# Patient Record
Sex: Male | Born: 1960 | Hispanic: No | Marital: Married | Smoking: Current every day smoker
Health system: Southern US, Community
[De-identification: ages and names within clinical notes are randomized; demographics above are authoritative.]

## PROBLEM LIST (undated history)

## (undated) DIAGNOSIS — F4310 Post-traumatic stress disorder, unspecified: Secondary | ICD-10-CM

## (undated) DIAGNOSIS — I469 Cardiac arrest, cause unspecified: Secondary | ICD-10-CM

## (undated) DIAGNOSIS — D649 Anemia, unspecified: Secondary | ICD-10-CM

## (undated) DIAGNOSIS — K649 Unspecified hemorrhoids: Secondary | ICD-10-CM

## (undated) DIAGNOSIS — F1910 Other psychoactive substance abuse, uncomplicated: Secondary | ICD-10-CM

## (undated) DIAGNOSIS — Z720 Tobacco use: Secondary | ICD-10-CM

## (undated) DIAGNOSIS — F32A Depression, unspecified: Secondary | ICD-10-CM

## (undated) DIAGNOSIS — F329 Major depressive disorder, single episode, unspecified: Secondary | ICD-10-CM

## (undated) DIAGNOSIS — J984 Other disorders of lung: Secondary | ICD-10-CM

## (undated) DIAGNOSIS — B1920 Unspecified viral hepatitis C without hepatic coma: Secondary | ICD-10-CM

## (undated) HISTORY — DX: Post-traumatic stress disorder, unspecified: F43.10

## (undated) HISTORY — DX: Unspecified hemorrhoids: K64.9

## (undated) HISTORY — DX: Other psychoactive substance abuse, uncomplicated: F19.10

## (undated) HISTORY — DX: Anemia, unspecified: D64.9

## (undated) HISTORY — PX: FRACTURE SURGERY: SHX138

## (undated) HISTORY — DX: Major depressive disorder, single episode, unspecified: F32.9

## (undated) HISTORY — DX: Unspecified viral hepatitis C without hepatic coma: B19.20

## (undated) HISTORY — PX: WISDOM TOOTH EXTRACTION: SHX21

## (undated) HISTORY — DX: Other disorders of lung: J98.4

## (undated) HISTORY — DX: Tobacco use: Z72.0

## (undated) HISTORY — DX: Depression, unspecified: F32.A

---

## 1997-08-28 HISTORY — PX: MULTIPLE TOOTH EXTRACTIONS: SHX2053

## 2001-05-08 ENCOUNTER — Emergency Department (HOSPITAL_COMMUNITY): Admission: EM | Admit: 2001-05-08 | Discharge: 2001-05-08 | Payer: Self-pay | Admitting: Emergency Medicine

## 2002-06-21 ENCOUNTER — Emergency Department (HOSPITAL_COMMUNITY): Admission: EM | Admit: 2002-06-21 | Discharge: 2002-06-21 | Payer: Self-pay

## 2002-06-22 ENCOUNTER — Emergency Department (HOSPITAL_COMMUNITY): Admission: EM | Admit: 2002-06-22 | Discharge: 2002-06-22 | Payer: Self-pay | Admitting: Emergency Medicine

## 2003-04-25 ENCOUNTER — Emergency Department (HOSPITAL_COMMUNITY): Admission: EM | Admit: 2003-04-25 | Discharge: 2003-04-25 | Payer: Self-pay | Admitting: Emergency Medicine

## 2003-04-25 ENCOUNTER — Encounter: Payer: Self-pay | Admitting: Emergency Medicine

## 2003-04-26 ENCOUNTER — Emergency Department (HOSPITAL_COMMUNITY): Admission: EM | Admit: 2003-04-26 | Discharge: 2003-04-26 | Payer: Self-pay

## 2005-08-13 ENCOUNTER — Emergency Department (HOSPITAL_COMMUNITY): Admission: EM | Admit: 2005-08-13 | Discharge: 2005-08-13 | Payer: Self-pay | Admitting: Emergency Medicine

## 2005-08-20 ENCOUNTER — Emergency Department (HOSPITAL_COMMUNITY): Admission: EM | Admit: 2005-08-20 | Discharge: 2005-08-20 | Payer: Self-pay | Admitting: Emergency Medicine

## 2005-10-19 ENCOUNTER — Emergency Department (HOSPITAL_COMMUNITY): Admission: EM | Admit: 2005-10-19 | Discharge: 2005-10-19 | Payer: Self-pay | Admitting: Family Medicine

## 2006-03-11 ENCOUNTER — Emergency Department (HOSPITAL_COMMUNITY): Admission: EM | Admit: 2006-03-11 | Discharge: 2006-03-11 | Payer: Self-pay | Admitting: Emergency Medicine

## 2009-08-28 HISTORY — PX: CYST REMOVAL NECK: SHX6281

## 2009-12-16 ENCOUNTER — Ambulatory Visit: Payer: Self-pay | Admitting: Physician Assistant

## 2009-12-16 ENCOUNTER — Ambulatory Visit (HOSPITAL_COMMUNITY): Admission: RE | Admit: 2009-12-16 | Discharge: 2009-12-16 | Payer: Self-pay | Admitting: Physician Assistant

## 2009-12-16 DIAGNOSIS — F17200 Nicotine dependence, unspecified, uncomplicated: Secondary | ICD-10-CM

## 2009-12-16 DIAGNOSIS — F329 Major depressive disorder, single episode, unspecified: Secondary | ICD-10-CM

## 2009-12-16 DIAGNOSIS — J069 Acute upper respiratory infection, unspecified: Secondary | ICD-10-CM | POA: Insufficient documentation

## 2009-12-21 DIAGNOSIS — J984 Other disorders of lung: Secondary | ICD-10-CM | POA: Insufficient documentation

## 2009-12-21 HISTORY — DX: Other disorders of lung: J98.4

## 2010-02-11 ENCOUNTER — Ambulatory Visit: Payer: Self-pay | Admitting: Physician Assistant

## 2010-02-11 DIAGNOSIS — A630 Anogenital (venereal) warts: Secondary | ICD-10-CM

## 2010-02-11 LAB — CONVERTED CEMR LAB
Bilirubin Urine: NEGATIVE
Blood in Urine, dipstick: NEGATIVE
Glucose, Urine, Semiquant: NEGATIVE
Ketones, urine, test strip: NEGATIVE
Nitrite: NEGATIVE
Protein, U semiquant: NEGATIVE
Specific Gravity, Urine: 1.025
Urobilinogen, UA: 0.2
WBC Urine, dipstick: NEGATIVE
pH: 6.5

## 2010-02-14 ENCOUNTER — Encounter: Payer: Self-pay | Admitting: Physician Assistant

## 2010-02-14 LAB — CONVERTED CEMR LAB
ALT: 58 units/L — ABNORMAL HIGH (ref 0–53)
AST: 44 units/L — ABNORMAL HIGH (ref 0–37)
Albumin: 4 g/dL (ref 3.5–5.2)
Alkaline Phosphatase: 61 units/L (ref 39–117)
Amphetamine Screen, Ur: NEGATIVE
BUN: 10 mg/dL (ref 6–23)
Barbiturate Quant, Ur: NEGATIVE
Basophils Absolute: 0 10*3/uL (ref 0.0–0.1)
Basophils Relative: 0 % (ref 0–1)
Benzodiazepines.: NEGATIVE
CO2: 27 meq/L (ref 19–32)
Calcium: 8.9 mg/dL (ref 8.4–10.5)
Chloride: 106 meq/L (ref 96–112)
Cholesterol: 120 mg/dL (ref 0–200)
Cocaine Metabolites: NEGATIVE
Creatinine, Ser: 0.9 mg/dL (ref 0.40–1.50)
Creatinine,U: 120.1 mg/dL
Eosinophils Absolute: 0.1 10*3/uL (ref 0.0–0.7)
Eosinophils Relative: 2 % (ref 0–5)
Glucose, Bld: 89 mg/dL (ref 70–99)
HCT: 46.1 % (ref 39.0–52.0)
HDL: 41 mg/dL (ref >39)
Hemoglobin: 15 g/dL (ref 13.0–17.0)
Hep B S Ab: NEGATIVE
Hepatitis B Surface Ag: NEGATIVE
LDL Cholesterol: 70 mg/dL (ref 0–99)
Lymphocytes Relative: 29 % (ref 12–46)
Lymphs Abs: 2.6 10*3/uL (ref 0.7–4.0)
MCHC: 32.5 g/dL (ref 30.0–36.0)
MCV: 85.1 fL (ref 78.0–100.0)
Marijuana Metabolite: POSITIVE — AB
Methadone: NEGATIVE
Monocytes Absolute: 1 10*3/uL (ref 0.1–1.0)
Monocytes Relative: 11 % (ref 3–12)
Neutro Abs: 5.2 10*3/uL (ref 1.7–7.7)
Neutrophils Relative %: 59 % (ref 43–77)
Opiate Screen, Urine: NEGATIVE
Phencyclidine (PCP): NEGATIVE
Platelets: 162 10*3/uL (ref 150–400)
Potassium: 5.3 meq/L (ref 3.5–5.3)
Propoxyphene: NEGATIVE
RBC: 5.42 M/uL (ref 4.22–5.81)
RDW: 13.6 % (ref 11.5–15.5)
Sodium: 140 meq/L (ref 135–145)
TSH: 1.262 microintl units/mL (ref 0.350–4.500)
Total Bilirubin: 0.6 mg/dL (ref 0.3–1.2)
Total CHOL/HDL Ratio: 2.9
Total Protein: 6.6 g/dL (ref 6.0–8.3)
Triglycerides: 44 mg/dL (ref <150)
VLDL: 9 mg/dL (ref 0–40)
WBC: 9 10*3/uL (ref 4.0–10.5)

## 2010-02-16 ENCOUNTER — Telehealth: Payer: Self-pay | Admitting: Physician Assistant

## 2010-02-21 ENCOUNTER — Ambulatory Visit: Payer: Self-pay | Admitting: Physician Assistant

## 2010-02-22 DIAGNOSIS — B1710 Acute hepatitis C without hepatic coma: Secondary | ICD-10-CM | POA: Insufficient documentation

## 2010-02-22 LAB — CONVERTED CEMR LAB
ALT: 50 units/L (ref 0–53)
AST: 32 units/L (ref 0–37)
Albumin: 4.4 g/dL (ref 3.5–5.2)
Alkaline Phosphatase: 68 units/L (ref 39–117)
Bilirubin, Direct: 0.1 mg/dL (ref 0.0–0.3)
HCV Ab: REACTIVE — AB
Hep A Total Ab: NEGATIVE
Hep B Core Total Ab: NEGATIVE
Indirect Bilirubin: 0.2 mg/dL (ref 0.0–0.9)
Total Bilirubin: 0.3 mg/dL (ref 0.3–1.2)
Total Protein: 6.9 g/dL (ref 6.0–8.3)

## 2010-02-25 ENCOUNTER — Ambulatory Visit: Payer: Self-pay | Admitting: Physician Assistant

## 2010-02-25 LAB — CONVERTED CEMR LAB: HCV Quantitative: 122000 intl units/mL — ABNORMAL HIGH (ref <43)

## 2010-03-04 ENCOUNTER — Telehealth: Payer: Self-pay | Admitting: Physician Assistant

## 2010-03-04 ENCOUNTER — Ambulatory Visit: Payer: Self-pay | Admitting: Physician Assistant

## 2010-03-08 ENCOUNTER — Ambulatory Visit: Payer: Self-pay | Admitting: Physician Assistant

## 2010-03-22 ENCOUNTER — Ambulatory Visit: Payer: Self-pay | Admitting: Physician Assistant

## 2010-03-24 ENCOUNTER — Encounter: Payer: Self-pay | Admitting: Physician Assistant

## 2010-04-04 ENCOUNTER — Telehealth (INDEPENDENT_AMBULATORY_CARE_PROVIDER_SITE_OTHER): Payer: Self-pay | Admitting: *Deleted

## 2010-04-19 ENCOUNTER — Emergency Department (HOSPITAL_COMMUNITY): Admission: EM | Admit: 2010-04-19 | Discharge: 2010-04-19 | Payer: Self-pay | Admitting: Emergency Medicine

## 2010-04-22 ENCOUNTER — Ambulatory Visit: Payer: Self-pay | Admitting: Physician Assistant

## 2010-04-22 DIAGNOSIS — S335XXA Sprain of ligaments of lumbar spine, initial encounter: Secondary | ICD-10-CM

## 2010-04-26 ENCOUNTER — Emergency Department (HOSPITAL_COMMUNITY): Admission: EM | Admit: 2010-04-26 | Discharge: 2010-04-26 | Payer: Self-pay | Admitting: Emergency Medicine

## 2010-05-08 ENCOUNTER — Encounter: Payer: Self-pay | Admitting: Physician Assistant

## 2010-05-08 ENCOUNTER — Emergency Department (HOSPITAL_COMMUNITY): Admission: EM | Admit: 2010-05-08 | Discharge: 2010-05-08 | Payer: Self-pay | Admitting: Emergency Medicine

## 2010-05-09 ENCOUNTER — Telehealth: Payer: Self-pay | Admitting: Physician Assistant

## 2010-05-09 ENCOUNTER — Emergency Department (HOSPITAL_COMMUNITY): Admission: EM | Admit: 2010-05-09 | Discharge: 2010-05-09 | Payer: Self-pay | Admitting: Emergency Medicine

## 2010-05-17 ENCOUNTER — Encounter (INDEPENDENT_AMBULATORY_CARE_PROVIDER_SITE_OTHER): Payer: Self-pay | Admitting: Internal Medicine

## 2010-05-19 ENCOUNTER — Ambulatory Visit: Payer: Self-pay | Admitting: Internal Medicine

## 2010-05-19 ENCOUNTER — Ambulatory Visit (HOSPITAL_COMMUNITY): Admission: RE | Admit: 2010-05-19 | Discharge: 2010-05-19 | Payer: Self-pay | Admitting: Physician Assistant

## 2010-05-19 DIAGNOSIS — G8929 Other chronic pain: Secondary | ICD-10-CM | POA: Insufficient documentation

## 2010-05-19 DIAGNOSIS — M25559 Pain in unspecified hip: Secondary | ICD-10-CM

## 2010-05-20 ENCOUNTER — Telehealth: Payer: Self-pay | Admitting: Physician Assistant

## 2010-05-23 ENCOUNTER — Emergency Department (HOSPITAL_COMMUNITY): Admission: EM | Admit: 2010-05-23 | Discharge: 2010-05-23 | Payer: Self-pay | Admitting: Emergency Medicine

## 2010-05-26 ENCOUNTER — Inpatient Hospital Stay: Payer: Self-pay | Admitting: Psychiatry

## 2010-05-31 ENCOUNTER — Emergency Department: Payer: Self-pay | Admitting: Emergency Medicine

## 2010-06-06 ENCOUNTER — Ambulatory Visit: Payer: Self-pay | Admitting: Physician Assistant

## 2010-06-06 ENCOUNTER — Telehealth: Payer: Self-pay | Admitting: Physician Assistant

## 2010-06-06 DIAGNOSIS — F4310 Post-traumatic stress disorder, unspecified: Secondary | ICD-10-CM

## 2010-06-14 ENCOUNTER — Ambulatory Visit: Payer: Self-pay | Admitting: Internal Medicine

## 2010-06-14 ENCOUNTER — Encounter: Payer: Self-pay | Admitting: Physician Assistant

## 2010-06-15 ENCOUNTER — Telehealth: Payer: Self-pay | Admitting: Physician Assistant

## 2010-06-17 ENCOUNTER — Encounter
Admission: RE | Admit: 2010-06-17 | Discharge: 2010-08-16 | Payer: Self-pay | Source: Home / Self Care | Attending: Physician Assistant | Admitting: Physician Assistant

## 2010-06-17 ENCOUNTER — Telehealth (INDEPENDENT_AMBULATORY_CARE_PROVIDER_SITE_OTHER): Payer: Self-pay | Admitting: Internal Medicine

## 2010-06-27 ENCOUNTER — Encounter: Payer: Self-pay | Admitting: Physician Assistant

## 2010-07-07 ENCOUNTER — Ambulatory Visit: Payer: Self-pay | Admitting: Gastroenterology

## 2010-07-07 ENCOUNTER — Encounter: Payer: Self-pay | Admitting: Physician Assistant

## 2010-07-11 ENCOUNTER — Encounter (INDEPENDENT_AMBULATORY_CARE_PROVIDER_SITE_OTHER): Payer: Self-pay | Admitting: Internal Medicine

## 2010-07-11 ENCOUNTER — Encounter: Payer: Self-pay | Admitting: Physician Assistant

## 2010-07-23 ENCOUNTER — Emergency Department (HOSPITAL_COMMUNITY)
Admission: EM | Admit: 2010-07-23 | Discharge: 2010-07-23 | Payer: Self-pay | Source: Home / Self Care | Admitting: Orthopedic Surgery

## 2010-08-16 ENCOUNTER — Telehealth (INDEPENDENT_AMBULATORY_CARE_PROVIDER_SITE_OTHER): Payer: Self-pay | Admitting: Internal Medicine

## 2010-08-18 ENCOUNTER — Ambulatory Visit: Payer: Self-pay | Admitting: Internal Medicine

## 2010-09-09 ENCOUNTER — Encounter (INDEPENDENT_AMBULATORY_CARE_PROVIDER_SITE_OTHER): Payer: Self-pay | Admitting: Nurse Practitioner

## 2010-09-26 ENCOUNTER — Emergency Department (HOSPITAL_COMMUNITY)
Admission: EM | Admit: 2010-09-26 | Discharge: 2010-09-26 | Payer: No Typology Code available for payment source | Source: Home / Self Care | Admitting: Emergency Medicine

## 2010-09-27 NOTE — Assessment & Plan Note (Signed)
Summary: Condyloma Treatment   Vital Signs:  Patient profile:   50 year old male Height:      70 inches Weight:      148 pounds BMI:     21.31 Temp:     97.4 degrees F oral Pulse rate:   59 / minute Pulse rhythm:   regular Resp:     18 per minute BP sitting:   103 / 68  (left arm) Cuff size:   large  Vitals Entered By: [name] (March 22, 2010 11:01 AM) CC: f/u.... Is Patient Diabetic? No Pain Assessment Patient in pain? no       Does patient need assistance? Functional Status Self care Ambulation Normal   Primary Care Provider:  Tereso Newcomer, PA-C  CC:  f/u.....  History of Present Illness: Here for tx of genital warts.  Current Medications (verified): 1)  Proventil Hfa 108 (90 Base) Mcg/act Aers (Albuterol Sulfate) .Marland Kitchen.. 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 1-2 puffs every 4-6 hours as needed  Patient Instructions: 1)  Please schedule with Derm Clinic on [facility]. 2)  Call me if you do not hear about an appointment with the Hepatitis C clinic in the next 3-4 weeks.

## 2010-09-27 NOTE — Assessment & Plan Note (Signed)
Summary: new to estab///kt   Vital Signs:  Patient profile:   50 year old male Height:      70 inches Weight:      150.38 pounds BMI:     21.66 Temp:     97.5 degrees F Pulse rate:   56 / minute Pulse rhythm:   regular Resp:     18 per minute BP sitting:   127 / 82  (right arm) Cuff size:   regular  Vitals Entered By: Chauncy Passy, SMA CC: Pt. is here to establish himself w/[facility]. Pt. has a cold associated w/a runny nose, body ache, congestion. This began on Friday and is feeling better.  Is Patient Diabetic? No Pain Assessment Patient in pain? no       Does patient need assistance? Functional Status Self care Ambulation Normal   Primary Care Provider:  Tereso Newcomer, PA-C  CC:  Pt. is here to establish himself w/[facility]. Pt. has a cold associated w/a runny nose, body ache, and congestion. This began on Friday and is feeling better. Marland Kitchen  History of Present Illness: New Patient. I see his wife Airline pilot.  Previously followed at [facility].  Says he had a CPE in 2009 and cholesterol, etc was ok.  Here c/o URI symptoms for about a week.  Notes myalgias, low grade temp.  No chills.  + cough . . . productive of white/clear sputum.  No hemoptysis or purulent sputum.  + rhinorrhea. Slight HA.  No sore throat.  No otalgia.  Feels like he is now getting better.  Taking benadryl and DayQuil.  Allergy symptoms are increased with exposure to pollen.  Has been out mowing yards.  Also, taking ibuprofen as needed.   Habits & Providers  Alcohol-Tobacco-Diet     Alcohol drinks/day: <1     Alcohol type: beer     Tobacco Status: current     Cigarette Packs/Day: 1.0     Pack years: 27  Exercise-Depression-Behavior     Drug Use: past  Current Medications (verified): 1)  None  Allergies (verified): No Known Drug Allergies  Past History:  Past Medical History: COPD Depression (?)   a.  saw a psychiatrist in past   b.  never been on  meds.  Family History: CAD - Dad with CABG 57 yo (died 80 with MI) Colon Cancer - Grandfather; no 1st degree relatives Family History Diabetes 1st degree relative - Dad Family History Hypertension - Dad and Mom Family History Depression - Mom  No prostate cancer  Social History: unemployed Statistician and Music therapist) Married has children from previous marriage (4 + 4 grandkids) Current Smoker Alcohol use-yes Drug use-no   a.  prior h/o THC, cocaine . . . none nowSmoking Status:  current Drug Use:  past Packs/Day:  1.0  Review of Systems  The patient denies chest pain, syncope, dyspnea on exertion, prolonged cough, hemoptysis, melena, hematochezia, and severe indigestion/heartburn.    Physical Exam  General:  alert, well-developed, and well-nourished.   Head:  normocephalic and atraumatic.   Eyes:  pupils equal, pupils round, and pupils reactive to light.   Ears:  R ear normal and L ear normal.   Nose:  no external deformity.   Mouth:  pharynx pink and moist and no exudates.   Neck:  supple and no cervical lymphadenopathy.   Lungs:  decreased breath sounds ins/exp wheezing bilat no rales  Heart:  normal rate and regular rhythm.   Abdomen:  soft and  non-tender.   Neurologic:  alert & oriented X3 and cranial nerves II-XII intact.   Psych:  normally interactive.     Impression & Recommendations:  Problem # 1:  URI (ICD-465.9)  likely viral conservative mgmt wheezing likely 2/2 URI in setting of smoking will rx proventil  Orders: CXR- 2view (CXR)  Problem # 2:  TOBACCO ABUSE (ICD-305.1) advised to quit consider PFTs at f/u if still has significant wheezing  Problem # 3:  Preventive Health Care (ICD-V70.0) schedule CPE  Problem # 4:  DEPRESSION (ICD-311) mainly situational brother murdered and he often gets down about it PHQ9=2 offered counseling if he wants. . . he declines at this time  Complete Medication List: 1)  Proventil Hfa 108 (90 Base) Mcg/act  Aers (Albuterol sulfate) .Marland Kitchen.. 1-2 puffs every 4-6 hours as needed  Patient Instructions: 1)  Tobacco is very bad for your health and your loved ones ! You should stop smoking !  2)  Stop smoking tips: Choose a quit date. Cut down before the quit date. Decide what you will do as a substitute when you feel the urge to smoke(gum, toothpick, exercise).  3)  Please schedule a follow-up appointment in 6 weeks with Lauralie Blacksher for CPE.  Come fasting (nothing to eat or drink after midnight the night before except water). 4)  Take 650 - 1000 mg of tylenol every 4-6 hours as needed for relief of pain or comfort of fever. Avoid taking more than 4000 mg in a 24 hour period( can cause liver damage in higher doses).  5)  Drink plenty of fluids. 6)  Use Proventil as needed for cough or wheezing. 7)  Robitussin DM is fine to use ( your can get over the counter ). Prescriptions: PROVENTIL HFA 108 (90 BASE) MCG/ACT AERS (ALBUTEROL SULFATE) 1-2 puffs every 4-6 hours as needed  #1 x 5   Entered and Authorized by:   Tereso Newcomer PA-C   Signed by:   Tereso Newcomer PA-C on 12/16/2009   Method used:   Print then Give to Patient   RxID:   [id number]

## 2010-09-27 NOTE — Assessment & Plan Note (Signed)
Summary: Lumbar Strain   Vital Signs:  Patient profile:   50 year old male Height:      70 inches Weight:      151 pounds BMI:     21.74 Temp:     97.7 degrees F oral Pulse rate:   65 / minute Pulse rhythm:   regular Resp:     18 per minute BP sitting:   118 / 78  (left arm) Cuff size:   large  Vitals Entered By: [name] (April 22, 2010 9:50 AM) CC: pt is here for back pain that is running down to his legs.... Is Patient Diabetic? No Pain Assessment Patient in pain? no       Does patient need assistance? Functional Status Self care Ambulation Normal   Primary Care Provider:  Tereso Newcomer, PA-C  CC:  pt is here for back pain that is running down to his legs.....  History of Present Illness: Developed low back pain about a week ago.  He is very active.  Had pulled up carpet and sanded hardwood floors.  Then, does a lot of landscaping.  Noted left lumbar pain that worsened.  He went to the ED on 8/23.  He was given valium and ibuprofen.  He is better but still has pain.  Has continued to try to do landscaping, etc.  Notes worse pain at night.  No radicular symptoms.  Has some occ sharp shooting pain lasting just seconds in post left leg.  No weakness.  No loss of bowel or bladder function.    Current Medications (verified): 1)  Proventil Hfa 108 (90 Base) Mcg/act Aers (Albuterol Sulfate) .Marland Kitchen.. 1-2 Puffs Every 4-6 Hours As Needed  Allergies (verified): No Known Drug Allergies  Past History:  Past Medical History: Last updated: 02/22/2010 COPD Depression (?)   a.  saw a psychiatrist in past   b.  never been on meds. +HCV antibody test  Physical Exam  General:  alert, well-developed, and well-nourished.   Head:  normocephalic and atraumatic.   Msk:  no spinal tend to palp  neg SLR bilat  Extremities:  no edema Neurologic:  alert & oriented X3 and cranial nerves II-XII intact.   Patellar DTRs 2+ bilat Achilles on right 2+; patient unable to relax on left  for DTR check strength BLE normal and equal in all muscle groups Skin:  turgor normal.   Psych:  normally interactive.     Impression & Recommendations:  Problem # 1:  LUMBAR STRAIN (ICD-847.2) reassurance encouraged rest for a few days no heavy lifting or bending or stooping Valium helping a lot . . . would like refilled taking ibuprofen cont with heat as needed  f/u as needed  Complete Medication List: 1)  Proventil Hfa 108 (90 Base) Mcg/act Aers (Albuterol sulfate) .Marland Kitchen.. 1-2 puffs every 4-6 hours as needed 2)  Valium 5 Mg Tabs (Diazepam) .Marland Kitchen.. 1 by mouth every 6 hours as needed for muscle pain or spasm  Patient Instructions: 1)  Most patients (90%) with low back pain will improve with time ( 2-6 weeks). Keep active but avoid activities that are painful. Apply moist heat and/or ice to lower back several times a day.  2)  NO heavy lifting, bending, stooping, pushing, pulling, etc for about a week to help your back heal. 3)  Continue Ibuprofen (motrin) three times a day with food for 3-4 more days, then as needed. 4)  You can use Tylenol (Acetaminophen) 500 mg 1-2 tabs every 6  hours as needed. 5)  Use valium only as needed for pain or spasm.  Do not take more that what is directed. 6)  Follow up if symptoms are no better in 2 weeks or get worse. Prescriptions: VALIUM 5 MG TABS (DIAZEPAM) 1 by mouth every 6 hours as needed for muscle pain or spasm  #30 x 0   Entered and Authorized by:   Tereso Newcomer PA-C   Signed by:   Tereso Newcomer PA-C on 04/22/2010   Method used:   Print then Give to Patient   RxID:   [id number]

## 2010-09-27 NOTE — Progress Notes (Signed)
  Phone Note Refill Request Message from:  Patient on June 17, 2010 10:39 AM  Refills Requested: Medication #1:  FLEXERIL 10 MG TABS Take 1 tablet by mouth once a day as needed [facility]   Method Requested: Electronic Initial call taken by: [name],  June 17, 2010 10:39 AM  Follow-up for Phone Call        Why is he requesting a refill of Flexeril?   Appears to have been a one time Rx. Did he start with PT yet? Follow-up by: Julieanne Manson MD,  June 21, 2010 11:32 PM  Additional Follow-up for Phone Call Additional follow up Details #1::        yes pt has started PT and he wants the med because he the pain he is in after PT Additional Follow-up by: [name],  June 22, 2010 12:37 PM    Additional Follow-up for Phone Call Additional follow up Details #2::    Please call into pharmacy for him--telephone order written.  Julieanne Manson MD  June 24, 2010 8:38 AM    pt is aware.... [name]  June 24, 2010 10:25 AM   Prescriptions: FLEXERIL 10 MG TABS (CYCLOBENZAPRINE HCL) Take 1 tablet by mouth once a day as needed  #30 x 0   Entered and Authorized by:   Julieanne Manson MD   Signed by:   Julieanne Manson MD on 06/24/2010   Method used:   Telephoned to ...       [facility] (retail)       [address] [place], [place]  [address]       Ph: [phone]       Fax: [phone]   RxID:   [id number]

## 2010-09-27 NOTE — Progress Notes (Signed)
Summary: Medical Specialty Clinic referral  Phone Note Other Incoming   Summary of Call: Please refer to Hep C clinic for Hepatitis C. Initial call taken by: Tereso Newcomer PA-C,  March 04, 2010 2:17 PM

## 2010-09-27 NOTE — Miscellaneous (Signed)
Summary: Rehab Report//DISCHARGE SUMMARY  Rehab Report//DISCHARGE SUMMARY   Imported By: Arta Bruce 07/29/2010 16:57:49  _____________________________________________________________________  External Attachment:    Type:   Image     Comment:   External Document

## 2010-09-27 NOTE — Progress Notes (Signed)
Summary: [facility]  Phone Note From Other Clinic   Summary of Call: Tonya from [place] lab called from the add-on by Aggie Cosier on 6/20 the Hep B Core and Hep A IGM was not able to be done from that specimen. Everything else was done but those two test. Initial call taken by: Levon Hedger,  February 16, 2010 9:13 AM  Follow-up for Phone Call        pt is aware and will give [place] a call to come in for an appt Follow-up by: [name],  February 16, 2010 11:23 AM

## 2010-09-27 NOTE — Assessment & Plan Note (Signed)
Summary: F/U PER PA Kennedy Bohanon / NS   Vital Signs:  Patient profile:   50 year old male Height:      70 inches Weight:      151 pounds BMI:     21.74 Temp:     97.7 degrees F oral Pulse rate:   76 / minute Pulse rhythm:   regular Resp:     18 per minute BP sitting:   110 / 70  (left arm) Cuff size:   regular  Vitals Entered By: [name] (February 25, 2010 12:14 PM) CC: f/u.. Is Patient Diabetic? No Pain Assessment Patient in pain? no       Does patient need assistance? Functional Status Self care Ambulation Normal   Primary Care Provider:  Tereso Newcomer, PA-C  CC:  f/u...  History of Present Illness: Here for repeat wart treatment.   Current Medications (verified): 1)  Proventil Hfa 108 (90 Base) Mcg/act Aers (Albuterol Sulfate) .Marland Kitchen.. 1-2 Puffs Every 4-6 Hours As Needed  Allergies (verified): No Known Drug Allergies  Physical Exam  General:  alert, well-developed, and well-nourished.   Head:  normocephalic and atraumatic.   Skin:  several condyloma on penis Psych:  normally interactive.     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 1-2 puffs every 4-6 hours as needed  Patient Instructions: 1)  Schedule follow up with Issak Goley in one week.

## 2010-09-27 NOTE — Letter (Signed)
Summary: DEPRESSION SCREENING  DEPRESSION SCREENING   Imported By: Arta Bruce 02/15/2010 15:34:01  _____________________________________________________________________  External Attachment:    Type:   Image     Comment:   External Document

## 2010-09-27 NOTE — Progress Notes (Signed)
Summary: Hepatitis C  Phone Note Outgoing Call   Summary of Call: Patient would like to go to [facility] to see Dr. Jacqualine Mau.  He is very nervous about his diagnosis.   Is it possible for him to go see him in [place] once and then follow up here afterward? If so, please arrange. Initial call taken by: Brynda Rim,  June 06, 2010 3:22 PM  Follow-up for Phone Call        PT HAVE AN APPT 07-07-10 @ 1:30PM  Follow-up by: Cheryll Dessert,  June 10, 2010 5:58 PM
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
## 2010-09-27 NOTE — Letter (Signed)
Summary: REFERRAL/DERMATOLOGY//APPT DATE & TIME  REFERRAL/DERMATOLOGY//APPT DATE & TIME   Imported By: Arta Bruce 03/23/2010 10:55:59  _____________________________________________________________________  External Attachment:    Type:   Image     Comment:   External Document

## 2010-09-27 NOTE — Assessment & Plan Note (Signed)
Summary: one week f/u per Lorin Picket /tmm   Vital Signs:  Patient profile:   50 year old male Height:      70 inches Weight:      148 pounds BMI:     21.31 Temp:     97.6 degrees F oral Pulse rate:   61 / minute Pulse rhythm:   regular Resp:     18 per minute BP sitting:   113 / 75  (left arm) Cuff size:   regular  Vitals Entered By: [name] (March 04, 2010 9:42 AM) Is Patient Diabetic? No Pain Assessment Patient in pain? no       Does patient need assistance? Functional Status Self care Ambulation Normal   Primary Care Provider:  Tereso Newcomer, PA-C   History of Present Illness: Here for tx of condyloma of penis. HCV labs came back with + viral load.   Current Medications (verified): 1)  Proventil Hfa 108 (90 Base) Mcg/act Aers (Albuterol Sulfate) .Marland Kitchen.. 1-2 Puffs Every 4-6 Hours As Needed  Allergies (verified): No Known Drug Allergies  Physical Exam  General:  alert, well-developed, and well-nourished.   Head:  normocephalic and atraumatic.   Genitalia:  several condyloma noted on penis treated with podophyllin   Impression & Recommendations:  Problem # 1:  HEPATITIS C (ICD-070.51)  viral load + discussed with patient and wife  refer to Hep C clinic  Orders: Misc. Referral (Misc. 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 1-2 puffs every 4-6 hours as needed  Patient Instructions: 1)  Schedule follow up with Bethzaida Boord in one week. 2)  Someone will call you to set up your referral.

## 2010-09-27 NOTE — Letter (Signed)
Summary: CALL A NURSE  CALL A NURSE   Imported By: Arta Bruce 05/12/2010 14:33:00  _____________________________________________________________________  External Attachment:    Type:   Image     Comment:   External Document

## 2010-09-27 NOTE — Progress Notes (Signed)
Summary: Have not heard from specialist  Phone Note Call from Patient   Summary of Call: Mrs. Hark states that they still have not heard from the specialist and wanted to let you know.   Initial call taken by: Dutch Quint RN,  April 04, 2010 4:26 PM  Follow-up for Phone Call        Hep C clinic or dermatology? Tereso Newcomer PA-C  April 04, 2010 5:56 PM   Hep C Clinic.   Dutch Quint RN  April 05, 2010 11:28 AM   Additional Follow-up for Phone Call Additional follow up Details #1::        Arna Medici Have we heard anything about his appt with the medical specialty clinic? Additional Follow-up by: Tereso Newcomer PA-C,  April 05, 2010 4:35 PM    Additional Follow-up for Phone Call Additional follow up Details #2::    Higinio Plan faxed on July I talk to then and they said that they only have 1 Dr and they trying to get the appts  they said that he can call @ [phone] .I spoke to pt today . Follow-up by: Cheryll Dessert,  April 06, 2010 5:32 PM

## 2010-09-27 NOTE — Progress Notes (Signed)
  Phone Note Refill Request Message from:  Patient on June 15, 2010 11:50 AM  Refills Requested: Medication #1:  NABUMETONE 500 MG TABS Take 1 tablet by mouth two times a day with food as needed for pain.   Last Refilled: 05/09/2010 [facility] [address]   Method Requested: Fax to Local Pharmacy Initial call taken by: [name],  June 15, 2010 11:49 AM  Follow-up for Phone Call        [facility] [address]. Rx in basket to fax. Tereso Newcomer PA-C  June 15, 2010 12:11 PM   Additional Follow-up for Phone Call Additional follow up Details #1::        faxed to pharmacy Additional Follow-up by: [name],  June 15, 2010 2:37 PM    Prescriptions: NABUMETONE 500 MG TABS (NABUMETONE) Take 1 tablet by mouth two times a day with food as needed for pain  #60 x 1   Entered and Authorized by:   Tereso Newcomer PA-C   Signed by:   Tereso Newcomer PA-C on 06/15/2010   Method used:   Printed then faxed to ...       [facility] [address] [id number] (retail)       [address] [place], [place]  [address]       Ph: [phone]       Fax: [phone]   RxID:   [id number]

## 2010-09-27 NOTE — Assessment & Plan Note (Signed)
Summary: Knee and Hip Pain   Vital Signs:  Patient profile:   50 year old male Height:      70 inches Weight:      145.7 pounds BMI:     20.98 Temp:     97.1 degrees F oral Pulse rate:   76 / minute Pulse rhythm:   regular Resp:     18 per minute BP sitting:   110 / 70  (left arm)  Vitals Entered By: [name] (May 19, 2010 10:12 AM) CC: F/U hospital Is Patient Diabetic? No Pain Assessment Patient in pain? no       Does patient need assistance? Functional Status Self care Ambulation Normal   Primary Care Provider:  Tereso Newcomer, PA-C  CC:  F/U hospital.  History of Present Illness: Here for f/u on knee and hip.  Went to ED.  Had to go back.  Was given prednisone.  Feels better now.  Of note, he had MRI of his back.  Results:  Normal Lumbar spine.  Pain in back started about a month ago.  Was doing some heavy work.  I saw him and thought he had a strain.  Pain then started to involve his left hip and left knee.  Had knee fx in high school.  Was casted for several mos.  NO swelling or redness.  No locking or instability.  Hip hurts with bending forward.  No loss of ROM.   Current Medications (verified): 1)  Proventil Hfa 108 (90 Base) Mcg/act Aers (Albuterol Sulfate) .Marland Kitchen.. 1-2 Puffs Every 4-6 Hours As Needed 2)  Valium 5 Mg Tabs (Diazepam) .Marland Kitchen.. 1 By Mouth Every 6 Hours As Needed For Muscle Pain or Spasm 3)  Flexeril 10 Mg Tabs (Cyclobenzaprine Hcl) .... Take One Tablet Two Times A Day As Needed For Back Pain or Spasm.  Only Take If Needed. 4)  Nabumetone 500 Mg Tabs (Nabumetone) .... Take 1 Tablet By Mouth Two Times A Day With Food As Needed For Pain  Allergies (verified): No Known Drug Allergies  Physical Exam  General:  alert, well-developed, and well-nourished.   Head:  normocephalic and atraumatic.   Msk:  Left hip:  + pain with internal rotation; + pain over ant joint  Left knee: no eff neg mcmurray neg ant drawer. + crepitus with  PROM  Neurologic:  alert & oriented X3 and cranial nerves II-XII intact.   Skin:  turgor normal.   Psych:  normally interactive.     Impression & Recommendations:  Problem # 1:  HIP PAIN, LEFT (ICD-719.45) check xrays prob refer to PT  His updated medication list for this problem includes:    Flexeril 10 Mg Tabs (Cyclobenzaprine hcl) .Marland Kitchen... Take 1 tablet by mouth once a day as needed    Nabumetone 500 Mg Tabs (Nabumetone) .Marland Kitchen... Take 1 tablet by mouth two times a day with food as needed for pain  Orders: Diagnostic X-Ray/Fluoroscopy (Diagnostic X-Ray/Flu)  Problem # 2:  KNEE PAIN, LEFT (ICD-719.46) suspect he aggravated old injury with back strain check xrays prob refer to PT  His updated medication list for this problem includes:    Flexeril 10 Mg Tabs (Cyclobenzaprine hcl) .Marland Kitchen... Take 1 tablet by mouth once a day as needed    Nabumetone 500 Mg Tabs (Nabumetone) .Marland Kitchen... Take 1 tablet by mouth two times a day with food as needed for pain  Orders: Diagnostic X-Ray/Fluoroscopy (Diagnostic X-Ray/Flu)  Problem # 3:  LUMBAR STRAIN (ICD-847.2) MRI neg prob refer to  PT out of flexeril reminded him to take only as needed  Complete Medication List: 1)  Proventil Hfa 108 (90 Base) Mcg/act Aers (Albuterol sulfate) .Marland Kitchen.. 1-2 puffs every 4-6 hours as needed 2)  Valium 5 Mg Tabs (Diazepam) .Marland Kitchen.. 1 by mouth every 6 hours as needed for muscle pain or spasm 3)  Flexeril 10 Mg Tabs (Cyclobenzaprine hcl) .... Take 1 tablet by mouth once a day as needed 4)  Nabumetone 500 Mg Tabs (Nabumetone) .... Take 1 tablet by mouth two times a day with food as needed for pain  Patient Instructions: 1)  Get your xrays. 2)  Take medicines as needed. 3)  I will likely send you to physical therapy.  I want your xrays first. Prescriptions: FLEXERIL 10 MG TABS (CYCLOBENZAPRINE HCL) Take 1 tablet by mouth once a day as needed  #30 x 0   Entered and Authorized by:   Tereso Newcomer PA-C   Signed by:   Tereso Newcomer  PA-C on 05/19/2010   Method used:   Print then Give to Patient   RxID:   [id number]    MRI EXAM  Procedure date:  04/26/2010  Findings:      Exam Type: Lumbar Spine Results:  Findings: The numbering convention used for this exam terms L5-S1   as the last full intervertebral disc space above the sacrum.   Alignment anatomic.  Spinal cord terminates posterior to the L1   vertebral body.  Marrow signal normal.  Paraspinal soft tissues   demonstrate a T2 hyperintense 1 cm lesion in the posterior right   hepatic lobe and sub centimeter cystic lesion in the left   interpolar kidney, both likely representing cysts.  Disc signal is   normal.  No protrusions or significant bulging is present.  No   central, lateral recess, or foraminal stenosis is identified along   the length of the lumbar spine.    IMPRESSION:   Negative MRI lumbar spine.

## 2010-09-27 NOTE — Progress Notes (Signed)
Summary: Back Pain  Phone Note Call from Patient Call back at [phone]   Caller: Spouse Reason for Call: Talk to Doctor Summary of Call: PT WENT TO THE HOSPITAL THIS WEEKEND AND THEY TOLDHIM TO COME BACK AND SEE THE PROVIDER TODAY ON MONDAY BUT THERE ISNT ANYTHING OPEN, HE WENT TO THE HOSPITAL FOR SEVERE PAIN IN HIS BACK AND LEG, PT REQUESTING SOMETHING FOR THE PAIN TOO ... Initial call taken by: Oscar La,  May 09, 2010 8:09 AM  Follow-up for Phone Call        will pull er notes and put them on your desk... Follow-up by: [name],  May 09, 2010 9:10 AM  Additional Follow-up for Phone Call Additional follow up Details #1::        MRI of lumbar spine done recently and this was ok. I just filled Valium for him on 8/26.   I will fill Flexeril for him.  Rx in basket. He can take Relafen 500 mg by mouth two times a day with food as needed for pain.  He can take Tylenol 500 mg 1-2 tabs every 6 hours as needed for pain. Schedule next available appt . . .with me or other provider if something available. Will need to discuss possible referral to physical therapy at his appt.   Additional Follow-up by: Tereso Newcomer PA-C,  May 09, 2010 9:54 AM    Additional Follow-up for Phone Call Additional follow up Details #2::    pt is aware and [facility] pharmacy Follow-up by: [name],  May 09, 2010 4:36 PM  New/Updated Medications: FLEXERIL 10 MG TABS (CYCLOBENZAPRINE HCL) Take one tablet two times a day as needed for back pain or spasm.  Only take if needed. NABUMETONE 500 MG TABS (NABUMETONE) Take 1 tablet by mouth two times a day with food as needed for pain Prescriptions: NABUMETONE 500 MG TABS (NABUMETONE) Take 1 tablet by mouth two times a day with food as needed for pain  #60 x 0   Entered and Authorized by:   Tereso Newcomer PA-C   Signed by:   Tereso Newcomer PA-C on 05/09/2010   Method used:   Printed then faxed to ...       [facility] (retail)       [address] [place], [place]  [address]       Ph: [phone]       Fax: [phone]   RxID:   [id number] FLEXERIL 10 MG TABS (CYCLOBENZAPRINE HCL) Take one tablet two times a day as needed for back pain or spasm.  Only take if needed.  #30 x 0   Entered and Authorized by:   Tereso Newcomer PA-C   Signed by:   Tereso Newcomer PA-C on 05/09/2010   Method used:   Printed then faxed to ...       [facility] (retail)       [address] [place], [place]  [address]       Ph: [phone]       Fax: [phone]   RxID:   [id number]

## 2010-09-27 NOTE — Miscellaneous (Signed)
Summary: Rehab Report//INITIAL SUMMARY  Rehab Report//INITIAL SUMMARY   Imported By: Arta Bruce 06/27/2010 10:49:50  _____________________________________________________________________  External Attachment:    Type:   Image     Comment:   External Document

## 2010-09-27 NOTE — Letter (Signed)
Summary: [facility]   Imported By: Arta Bruce 06/15/2010 12:17:38  _____________________________________________________________________  External Attachment:    Type:   Image     Comment:   External Document

## 2010-09-27 NOTE — Progress Notes (Signed)
Summary: PT referral  Phone Note Outgoing Call   Summary of Call: hip and knee xrays ok just a little arthritis in his knee I would like to send him to PT notify pt before making referral order in basket  Initial call taken by: Brynda Rim,  May 20, 2010 3:00 PM  Follow-up for Phone Call        pt is aware Follow-up by: [name],  May 23, 2010 9:23 AM       Impression & Recommendations:  Problem # 1:  KNEE PAIN, LEFT (ICD-719.46)  His updated medication list for this problem includes:    Flexeril 10 Mg Tabs (Cyclobenzaprine hcl) .Marland Kitchen... Take 1 tablet by mouth once a day as needed    Nabumetone 500 Mg Tabs (Nabumetone) .Marland Kitchen... Take 1 tablet by mouth two times a day with food as needed for pain  Orders: Physical Therapy Referral (PT)  Problem # 2:  HIP PAIN, LEFT (ICD-719.45)  His updated medication list for this problem includes:    Flexeril 10 Mg Tabs (Cyclobenzaprine hcl) .Marland Kitchen... Take 1 tablet by mouth once a day as needed    Nabumetone 500 Mg Tabs (Nabumetone) .Marland Kitchen... Take 1 tablet by mouth two times a day with food as needed for pain  Orders: Physical Therapy Referral (PT)  Complete Medication List: 1)  Proventil Hfa 108 (90 Base) Mcg/act Aers (Albuterol sulfate) .Marland Kitchen.. 1-2 puffs every 4-6 hours as needed 2)  Valium 5 Mg Tabs (Diazepam) .Marland Kitchen.. 1 by mouth every 6 hours as needed for muscle pain or spasm 3)  Flexeril 10 Mg Tabs (Cyclobenzaprine hcl) .... Take 1 tablet by mouth once a day as needed 4)  Nabumetone 500 Mg Tabs (Nabumetone) .... Take 1 tablet by mouth two times a day with food as needed for pain

## 2010-09-27 NOTE — Letter (Signed)
Summary: MEDICAL [facility] SERVICES/TO SET UP APPT  [facility]ICAL [facility]TY SERVICES/TO SET UP APPT   Imported By: Arta Bruce 03/21/2010 12:53:37  _____________________________________________________________________  External Attachment:    Type:   Image     Comment:   External Document

## 2010-09-27 NOTE — Letter (Signed)
Summary: [place]/PHYSICIAN DOCUMENT SHEET  [facility] [facility]/PHYSICIAN DOCUMENT SHEET   Imported By: Arta Bruce 05/17/2010 15:14:48  _____________________________________________________________________  External Attachment:    Type:   Image     Comment:   External Document

## 2010-09-27 NOTE — Miscellaneous (Signed)
Summary: Rehab Report//DISCHARGE SUMMARY  Rehab Report//DISCHARGE SUMMARY   Imported By: Arta Bruce 07/29/2010 16:51:09  _____________________________________________________________________  External Attachment:    Type:   Image     Comment:   External Document

## 2010-09-27 NOTE — Progress Notes (Signed)
Summary: Office Visit//DEPRESSION SCREENING  Office Visit//DEPRESSION SCREENING   Imported By: Arta Bruce 06/14/2010 15:41:45  _____________________________________________________________________  External Attachment:    Type:   Image     Comment:   External Document

## 2010-09-27 NOTE — Assessment & Plan Note (Signed)
Summary: F/U PER PROVIDER  / NS   Vital Signs:  Patient profile:   50 year old male Height:      70 inches Weight:      151 pounds BMI:     21.74 Temp:     97.9 degrees F oral Pulse rate:   54 / minute Pulse rhythm:   regular Resp:     18 per minute BP sitting:   112 / 68  (left arm) Cuff size:   regular  Vitals Entered By: [name] (February 21, 2010 3:09 PM) CC: f/u.... pt would lke to know test results... Is Patient Diabetic? No Pain Assessment Patient in pain? no       Does patient need assistance? Functional Status Self care Ambulation Normal   Primary Care Provider:  Tereso Newcomer, PA-C  CC:  f/u.... pt would lke to know test results....  History of Present Illness: Here for treatment of genital warts.   Current Medications (verified): 1)  Proventil Hfa 108 (90 Base) Mcg/act Aers (Albuterol Sulfate) .Marland Kitchen.. 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 1-2 puffs every 4-6 hours as needed  Patient Instructions: 1)  Follow up as planned at the end of this week.

## 2010-09-27 NOTE — Assessment & Plan Note (Signed)
Summary: Condyloma   Vital Signs:  Patient profile:   50 year old male Height:      70 inches Weight:      152 pounds BMI:     21.89 Temp:     97.8 degrees F oral Pulse rate:   54 / minute Pulse rhythm:   regular Resp:     18 per minute BP sitting:   108 / 69  (left arm) Cuff size:   large  Vitals Entered By: [name] (March 08, 2010 8:45 AM) Is Patient Diabetic? No Pain Assessment Patient in pain? no       Does patient need assistance? Functional Status Self care Ambulation Normal   Primary Care Provider:  Tereso Newcomer, PA-C   History of Present Illness: Here for condyloma treatment   Current Medications (verified): 1)  Proventil Hfa 108 (90 Base) Mcg/act Aers (Albuterol Sulfate) .Marland Kitchen.. 1-2 Puffs Every 4-6 Hours As Needed  Allergies (verified): No Known Drug Allergies  Physical Exam  General:  alert, well-developed, and well-nourished.   Head:  normocephalic and atraumatic.   Genitalia:  several condyloma noted on penis treated with podophyllin Neurologic:  alert & oriented X3 and cranial nerves II-XII intact.   Psych:  normally interactive.     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 1-2 puffs every 4-6 hours as needed  Patient Instructions: 1)  Follow up with Scott in one week.

## 2010-09-27 NOTE — Assessment & Plan Note (Signed)
Summary: fu for cpe////kt   Vital Signs:  Patient profile:   50 year old male Height:      70 inches Weight:      152 pounds Temp:     97.3 degrees F oral Pulse rate:   53 / minute Pulse rhythm:   regular Resp:     18 per minute BP sitting:   116 / 73  (left arm) Cuff size:   regular  Vitals Entered By: [name] (February 11, 2010 10:22 AM) CC: complete physical, patient wants to know more about chest x-ray,nodle size Is Patient Diabetic? No Pain Assessment Patient in pain? no       Does patient need assistance? Functional Status Self care Ambulation Normal   Primary Care Provider:  Tereso Newcomer, PA-C  CC:  complete physical, patient wants to know more about chest x-ray, and nodle size.  History of Present Illness: Here for CPE. Health Maint: Last Td 2003. Grandfather with colon cancer; no first degree relative. Mom has polyps. No FHx prostate cancer. PHQ9 done last visit.   Discussed PSA.  Does not want to get now.  No urinary symptoms.    Problems Prior to Update: 1)  Preventive Health Care  (ICD-V70.0) 2)  Condyloma Acuminata, Penis  (ICD-078.11) 3)  Pulmonary Nodule  (ICD-518.89) 4)  Tobacco Abuse  (ICD-305.1) 5)  Uri  (ICD-465.9) 6)  Family History Depression  (ICD-V17.0) 7)  Family History Diabetes 1st Degree Relative  (ICD-V18.0) 8)  Depression  (ICD-311) 9)  COPD  (ICD-496)  Current Medications (verified): 1)  Proventil Hfa 108 (90 Base) Mcg/act Aers (Albuterol Sulfate) .Marland Kitchen.. 1-2 Puffs Every 4-6 Hours As Needed  Allergies (verified): No Known Drug Allergies  Past History:  Past Medical History: Last updated: 12/16/2009 COPD Depression (?)   a.  saw a psychiatrist in past   b.  never been on meds.  Family History: Reviewed history from 12/16/2009 and no changes required. CAD - Dad with CABG 56 yo (died 22 with MI) Colon Cancer - Grandfather; no 1st degree relatives Family History Diabetes 1st degree relative - Dad Family History  Hypertension - Dad and Mom Family History Depression - Mom  No prostate cancer  Social History: Reviewed history from 12/16/2009 and no changes required. unemployed Therapist, music) Married has children from previous marriage (4 + 4 grandkids) Current Smoker Alcohol use-yes Drug use-no   a.  prior h/o THC, cocaine . . . none now  Review of Systems      See HPI General:  Denies chills and fever. CV:  Denies chest pain or discomfort and fainting. Resp:  Denies cough, shortness of breath, and wheezing; used proventil once since last being seen. GI:  Denies change in bowel habits, constipation, dark tarry stools, and diarrhea. GU:  Denies dysuria, nocturia, urinary frequency, and urinary hesitancy. MS:  Complains of joint pain. Derm:  Denies rash. Neuro:  Denies headaches. Psych:  Denies depression. Endo:  Denies cold intolerance and heat intolerance. Heme:  Denies bleeding.  Physical Exam  General:  alert, well-developed, and well-nourished.   Head:  normocephalic and atraumatic.   Eyes:  pupils equal, pupils round, pupils reactive to light, and no optic disk abnormalities.   Ears:  R ear normal and L ear normal.   Nose:  no external deformity.   Mouth:  pharynx pink and moist, no erythema, and no exudates.   Neck:  supple, no thyromegaly, no carotid bruits, and no cervical lymphadenopathy.   Chest Wall:  no deformities.   Breasts:  no gynecomastia.   Lungs:  normal breath sounds, no crackles, and no wheezes.   Heart:  normal rate, regular rhythm, and no murmur.   Abdomen:  soft, non-tender, and normal bowel sounds.   Rectal:  external hemorrhoid(s).   Genitalia:  circumcised, no hydrocele, no varicocele, no scrotal masses, no testicular masses or atrophy, no urethral discharge, and condylomata  scattered about penis near corona  Prostate:  no gland enlargement, no nodules, no asymmetry, and no induration.   Msk:  normal ROM.   Pulses:  R posterior tibial  normal, R dorsalis pedis normal, L posterior tibial normal, and L dorsalis pedis normal.   Extremities:  no edema Neurologic:  alert & oriented X3, cranial nerves II-XII intact, and DTRs symmetrical and normal.   Skin:  turgor normal.   Psych:  normally interactive and good eye contact.     Impression & Recommendations:  Problem # 1:  PULMONARY NODULE (ICD-518.89) 4 mm on CXR no change compared with 2006 no further w/u needed  Problem # 2:  TOBACCO ABUSE (ICD-305.1) discussed cessation  Problem # 3:  COPD (ICD-496) stable using proventil rarely  His updated medication list for this problem includes:    Proventil Hfa 108 (90 Base) Mcg/act Aers (Albuterol sulfate) .Marland Kitchen... 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 1-2 puffs every 4-6 hours as needed  Patient Instructions: 1)  Tobacco is very bad for your health and your loved ones ! You should stop smoking !  2)  Stop smoking tips: Choose a quit date. Cut down before the quit date. Decide what you will do as a substitute when you feel the urge to smoke(gum, toothpick, exercise).  3)  Let me know if you want to try Chantix or Welllbutrin to help you quit smoking. 4)  Otherwise, you can get nicotine patches  over the counter. 5)  Schedule appointment with Lorin Picket in next few weeks on a Monday and then again on Friday of that same week for follow up.  Laboratory Results   Urine Tests    Routine Urinalysis   Color: yellow Appearance: Clear Glucose: negative   (Normal Range: Negative) Bilirubin: negative   (Normal Range: Negative) Ketone: negative   (Normal Range: Negative) Spec. Gravity: 1.025   (Normal Range: 1.003-1.035) Blood: negative   (Normal Range: Negative) pH: 6.5   (Normal Range: 5.0-8.0) Protein: negative   (Normal Range: Negative) Urobilinogen: 0.2   (Normal Range: 0-1) Nitrite: negative   (Normal Range: Negative) Leukocyte Esterace: negative   (Normal Range: Negative)

## 2010-09-29 NOTE — Miscellaneous (Signed)
Summary: Med changes  Clinical Lists Changes  Pt has been to [facility] for Hepatitis. First visit this week. He will f/u with psych prior to starting treatment. He was advised to ask this office for trazodone to help with sleep will order and fax to [facility] pharmacy n.martin,fnp September 09, 2010  5:25 PM '   Medications: Added new medication of TRAZODONE HCL 50 MG TABS (TRAZODONE HCL) One tablet by mouth nightly as needed for sleep - Signed Rx of TRAZODONE HCL 50 MG TABS (TRAZODONE HCL) One tablet by mouth nightly as needed for sleep;  #30 x 0;  Signed;  Entered by: Lehman Prom FNP;  Authorized by: Lehman Prom FNP;  Method used: Faxed to [facility], [address], [place], [place]  [address], Ph: [phone], Fax: [phone]    Prescriptions: TRAZODONE HCL 50 MG TABS (TRAZODONE HCL) One tablet by mouth nightly as needed for sleep  #30 x 0   Entered and Authorized by:   Lehman Prom FNP   Signed by:   Lehman Prom FNP on 09/09/2010   Method used:   Faxed to ...       [facility] - [facility] (retail)       [address] [place], [place]  [address]       Ph: [phone]       Fax: [phone]   RxID:   [id number]

## 2010-09-29 NOTE — Letter (Signed)
Summary: [facility]   Imported By: Arta Bruce 08/15/2010 14:18:24  _____________________________________________________________________  External Attachment:    Type:   Image     Comment:   External Document

## 2010-09-29 NOTE — Progress Notes (Signed)
  Phone Note Call from Patient   Summary of Call: pt says he recieed some results from dr. Jacqualine Mau office that says he has hep c and that his bloodwork genotype 3A and his liver test are acceptable and function is noraml...Marland Kitchen pt just wants to know what type 3A doing Initial call taken by: [name],  August 16, 2010 2:23 PM  Follow-up for Phone Call        The type is the strain of the Hep C virus--some are more easily treatable than others.  Types 2 and 3 are generally respond to treatment better than Types 1 and 4. His type falls in the category that tends to respond better to treatment.   He needs to go to the health dept and start vaccination for both Hep A and Hep B viruses. If too expensive for vaccination in --can call the one in [place] and see if less there (and if he can get it there) He needs to make sure he is not using alcohol at all--that can promote worsening infection of the Hep C and damage to his liver.  Follow-up by: Julieanne Manson MD,  September 01, 2010 8:44 AM  Additional Follow-up for Phone Call Additional follow up Details #1::        Left message on answering machine for pt to call back...Marland KitchenMarland KitchenArmenia Shannon  September 01, 2010 9:09 AM   pt is aware... [name]  September 01, 2010 12:15 PM

## 2010-09-30 NOTE — Letter (Signed)
Summary: eval./medical specialty services  eval./medical specialty services   Imported By: Arta Bruce 07/27/2010 10:22:50  _____________________________________________________________________  External Attachment:    Type:   Image     Comment:   External Document

## 2010-09-30 NOTE — Letter (Signed)
Summary: APPT /MEDICAL SPECIALTY  APPT /MEDICAL SPECIALTY   Imported By: Arta Bruce 05/05/2010 15:00:09  _____________________________________________________________________  External Attachment:    Type:   Image     Comment:   External Document

## 2010-09-30 NOTE — Letter (Signed)
Summary: PT INFORMATION SHEET  PT INFORMATION SHEET   Imported By: Arta Bruce 02/10/2010 14:17:25  _____________________________________________________________________  External Attachment:    Type:   Image     Comment:   External Document

## 2010-10-12 DIAGNOSIS — L723 Sebaceous cyst: Secondary | ICD-10-CM | POA: Insufficient documentation

## 2010-11-01 ENCOUNTER — Encounter: Payer: Self-pay | Admitting: Nurse Practitioner

## 2010-11-01 ENCOUNTER — Encounter (INDEPENDENT_AMBULATORY_CARE_PROVIDER_SITE_OTHER): Payer: Self-pay | Admitting: Nurse Practitioner

## 2010-11-01 LAB — CONVERTED CEMR LAB: OCCULT 1: NEGATIVE

## 2010-11-02 ENCOUNTER — Encounter (INDEPENDENT_AMBULATORY_CARE_PROVIDER_SITE_OTHER): Payer: Self-pay | Admitting: Nurse Practitioner

## 2010-11-08 NOTE — Letter (Signed)
Summary: [facility]   Imported By: Arta Bruce 11/02/2010 10:42:13  _____________________________________________________________________  External Attachment:    Type:   Image     Comment:   External Document

## 2010-11-08 NOTE — Assessment & Plan Note (Signed)
Summary: Mood problems   Vital Signs:  Patient profile:   50 year old male Weight:      154.5 pounds BMI:     22.25 Temp:     97.4 degrees F oral Pulse rate:   63 / minute Pulse rhythm:   regular Resp:     16 per minute BP sitting:   118 / 78  (left arm) Cuff size:   large  Vitals Entered By: Levon Hedger (November 01, 2010 11:58 AM) CC: referral to psychiatrist, , , wants to have prostate checked, Depression Is Patient Diabetic? No Pain Assessment Patient in pain? no       Does patient need assistance? Functional Status Self care Ambulation Normal   Primary Care Provider:  Tereso Newcomer, PA-C  CC:  referral to psychiatrist, , , wants to have prostate checked, and Depression.  History of Present Illness:  Pt into the office for f/u on Hepatitis C. Pt has been going to [facility] He is being screened for Hepatitis C treatement and part of that screening requires that pt sees a psychiatrist. Pt was previously evaluated for possible bipolar because he had an episode of being up for 3 nights.  He was placed in the hospital.  He was started on geodon of which he weaned himself off.  Psychosocial stress factors include the recent death of a loved one.        Comments:  pt still has issues with finding his dead brother many years ago.  he admits that doing the anniversary of his death then he does notice a change in mood.   Habits & Providers  Alcohol-Tobacco-Diet     Alcohol drinks/day: <1     Alcohol type: beer     Tobacco Status: current     Tobacco Counseling: to quit use of tobacco products     Cigarette Packs/Day: 1.0     Pack years: 37  Exercise-Depression-Behavior     Does Patient Exercise: no     Have you felt down or hopeless? yes     Have you felt little pleasure in things? yes     Depression Counseling: further diagnostic testing and/or other treatment is indicated     Drug Use: past  Medications Prior to Update: 1)  Proventil Hfa 108 (90 Base)  Mcg/act Aers (Albuterol Sulfate) .Marland Kitchen.. 1-2 Puffs Every 4-6 Hours As Needed 2)  Valium 5 Mg Tabs (Diazepam) .Marland Kitchen.. 1 By Mouth Every 6 Hours As Needed For Muscle Pain or Spasm 3)  Flexeril 10 Mg Tabs (Cyclobenzaprine Hcl) .... Take 1 Tablet By Mouth Once A Day As Needed 4)  Nabumetone 500 Mg Tabs (Nabumetone) .... Take 1 Tablet By Mouth Two Times A Day With Food As Needed For Pain 5)  Trazodone Hcl 50 Mg Tabs (Trazodone Hcl) .... One Tablet By Mouth Nightly As Needed For Sleep  Allergies (verified): No Known Drug Allergies  Social History: Does Patient Exercise:  no  Review of Systems General:  Denies fever. CV:  Denies chest pain or discomfort. Resp:  Denies cough. GI:  Denies abdominal pain, nausea, and vomiting. Psych:  Complains of mental problems.  Physical Exam  General:  alert.   Head:  normocephalic.   Mouth:  poor dentation, missing multiple teeth Lungs:  normal breath sounds.   Heart:  normal rate and regular rhythm.   Prostate:  no gland enlargement.   Msk:  up to the exam table Neurologic:  alert & oriented X3.   Skin:  color normal.   Psych:  Oriented X3.     Impression & Recommendations:  Problem # 1:  HEPATITIS C (ICD-070.51) Advised ongoing f/u with hepatologist as ordered  Problem # 2:  COPD (ICD-496) advised cessation His updated medication list for this problem includes:    Proventil Hfa 108 (90 Base) Mcg/act Aers (Albuterol sulfate) .Marland Kitchen... 1-2 puffs every 4-6 hours as needed  Problem # 3:  DEPRESSION (ICD-311) mental health counselor into the room to see pt today will work on referral for further workup His updated medication list for this problem includes:    Valium 5 Mg Tabs (Diazepam) .Marland Kitchen... 1 by mouth every 6 hours as needed for muscle pain or spasm    Trazodone Hcl 50 Mg Tabs (Trazodone hcl) ..... One tablet by mouth nightly as needed for sleep  Complete Medication List: 1)  Proventil Hfa 108 (90 Base) Mcg/act Aers (Albuterol sulfate) .Marland Kitchen.. 1-2  puffs every 4-6 hours as needed 2)  Valium 5 Mg Tabs (Diazepam) .Marland Kitchen.. 1 by mouth every 6 hours as needed for muscle pain or spasm 3)  Flexeril 10 Mg Tabs (Cyclobenzaprine hcl) .... Take 1 tablet by mouth once a day as needed 4)  Nabumetone 500 Mg Tabs (Nabumetone) .... Take 1 tablet by mouth two times a day with food as needed for pain 5)  Trazodone Hcl 50 Mg Tabs (Trazodone hcl) .... One tablet by mouth nightly as needed for sleep  Other Orders: T-PSA ([id number]) Hemoccult Guaiac-1 spec.(in office) (86578)  Patient Instructions: 1)  You will need to contact [facility] department for Hepatitis A and B vaccines.  There will be a charge for this but you will need to contact them for the exact cost. 2)  Prostate labs will be checked today 3)  Rectal exam was normal. 4)  Follow up as needed Prescriptions: TRAZODONE HCL 50 MG TABS (TRAZODONE HCL) One tablet by mouth nightly as needed for sleep  #30 x 0   Entered and Authorized by:   Lehman Prom FNP   Signed by:   Lehman Prom FNP on 11/01/2010   Method used:   Faxed to ...       [facility] (retail)       [address] [place], [place]  [address]       Ph: [phone]       Fax: [phone]   RxID:   [id number] FLEXERIL 10 MG TABS (CYCLOBENZAPRINE HCL) Take 1 tablet by mouth once a day as needed  #30 x 0   Entered and Authorized by:   Lehman Prom FNP   Signed by:   Lehman Prom FNP on 11/01/2010   Method used:   Faxed to ...       [facility] (retail)       [address] [place], [place]  [address]       Ph: [phone]       Fax: [phone]   RxID:   [id number]    Orders Added: 1)  Est. Patient Level III [99213] 2)  T-PSA [phone] 3)  Hemoccult Guaiac-1 spec.(in office) [82270]    Laboratory Results  Date/Time Received: November 01, 2010 12:38 PM   Stool - Occult Blood Hemmoccult #1:  negative Date: 11/01/2010

## 2010-11-08 NOTE — Letter (Signed)
Summary: *HSN Results Follow up  [facility]  [address] [place], [place] [address]   Phone: [phone]  Fax: [phone]      11/02/2010   [name] [name] [address] [place], [place]  [address]   Dear  Mr. [name],                            ____S.Drinkard,FNP   ____D. Gore,FNP       ____B. McPherson,MD   ____V. Rankins,MD    ____E. Mulberry,MD    __X__N. Daphine Deutscher, FNP  ____D. Reche Dixon, MD    ____K. Philipp Deputy, MD    ____Other     This letter is to inform you that your recent test(s):  _______Pap Smear    __X_____Lab Test     _______X-ray    ___X____ is within acceptable limits  _______ requires a medication change  _______ requires a follow-up lab visit  _______ requires a follow-up visit with your provider   Comments:  Labs done during recent office visit are normal.       _________________________________________________________ If you have any questions, please contact our office [phone].                    Sincerely,    Lehman Prom FNP [facility]

## 2010-11-10 LAB — CBC
HCT: 43.6 % (ref 39.0–52.0)
Hemoglobin: 14.7 g/dL (ref 13.0–17.0)
MCH: 28 pg (ref 26.0–34.0)
MCHC: 33.7 g/dL (ref 30.0–36.0)
MCV: 83 fL (ref 78.0–100.0)
Platelets: 165 10*3/uL (ref 150–400)
RBC: 5.25 MIL/uL (ref 4.22–5.81)
RDW: 13.6 % (ref 11.5–15.5)
WBC: 13.7 10*3/uL — ABNORMAL HIGH (ref 4.0–10.5)

## 2010-11-10 LAB — POCT I-STAT, CHEM 8
BUN: 17 mg/dL (ref 6–23)
Calcium, Ion: 1.06 mmol/L — ABNORMAL LOW (ref 1.12–1.32)
Chloride: 108 mEq/L (ref 96–112)
Creatinine, Ser: 1.1 mg/dL (ref 0.4–1.5)
Glucose, Bld: 103 mg/dL — ABNORMAL HIGH (ref 70–99)
HCT: 48 % (ref 39.0–52.0)
Hemoglobin: 16.3 g/dL (ref 13.0–17.0)
Potassium: 3.6 mEq/L (ref 3.5–5.1)
Sodium: 141 mEq/L (ref 135–145)
TCO2: 23 mmol/L (ref 0–100)

## 2010-11-10 LAB — POCT CARDIAC MARKERS
CKMB, poc: <1 ng/mL — ABNORMAL LOW (ref 1.0–8.0)
Myoglobin, poc: 69 ng/mL (ref 12–200)
Troponin i, poc: <0.05 ng/mL (ref 0.00–0.09)

## 2010-11-10 LAB — DIFFERENTIAL
Basophils Absolute: 0 10*3/uL (ref 0.0–0.1)
Basophils Relative: 0 % (ref 0–1)
Eosinophils Absolute: 0.1 10*3/uL (ref 0.0–0.7)
Eosinophils Relative: 1 % (ref 0–5)
Lymphocytes Relative: 32 % (ref 12–46)
Lymphs Abs: 4.4 10*3/uL — ABNORMAL HIGH (ref 0.7–4.0)
Monocytes Absolute: 1.4 10*3/uL — ABNORMAL HIGH (ref 0.1–1.0)
Monocytes Relative: 10 % (ref 3–12)
Neutro Abs: 7.8 10*3/uL — ABNORMAL HIGH (ref 1.7–7.7)
Neutrophils Relative %: 57 % (ref 43–77)

## 2010-11-16 ENCOUNTER — Encounter (HOSPITAL_BASED_OUTPATIENT_CLINIC_OR_DEPARTMENT_OTHER)
Admission: RE | Admit: 2010-11-16 | Discharge: 2010-11-16 | Disposition: A | Discharge status: Home / Self Care | Payer: Self-pay | Source: Ambulatory Visit | Attending: Surgery | Admitting: Surgery

## 2010-11-16 DIAGNOSIS — Z01812 Encounter for preprocedural laboratory examination: Secondary | ICD-10-CM | POA: Insufficient documentation

## 2010-11-16 LAB — BASIC METABOLIC PANEL
CO2: 28 mEq/L (ref 19–32)
Calcium: 8.5 mg/dL (ref 8.4–10.5)
Chloride: 103 mEq/L (ref 96–112)
Creatinine, Ser: 0.92 mg/dL (ref 0.4–1.5)
GFR calc Af Amer: >60 mL/min (ref >60)
GFR calc non Af Amer: >60 mL/min (ref >60)
Glucose, Bld: 106 mg/dL — ABNORMAL HIGH (ref 70–99)
Potassium: 3.8 mEq/L (ref 3.5–5.1)
Sodium: 134 mEq/L — ABNORMAL LOW (ref 135–145)

## 2010-11-16 LAB — HEPATIC FUNCTION PANEL
ALT: 52 U/L (ref 0–53)
AST: 32 U/L (ref 0–37)
Albumin: 3.7 g/dL (ref 3.5–5.2)
Alkaline Phosphatase: 47 U/L (ref 39–117)
Total Bilirubin: 0.4 mg/dL (ref 0.3–1.2)
Total Protein: 6.3 g/dL (ref 6.0–8.3)

## 2010-11-18 ENCOUNTER — Ambulatory Visit (HOSPITAL_BASED_OUTPATIENT_CLINIC_OR_DEPARTMENT_OTHER)
Admission: RE | Admit: 2010-11-18 | Discharge: 2010-11-18 | Disposition: A | Discharge status: Home / Self Care | Payer: Self-pay | Source: Ambulatory Visit | Attending: Surgery | Admitting: Surgery

## 2010-11-18 ENCOUNTER — Other Ambulatory Visit: Payer: Self-pay | Admitting: Surgery

## 2010-11-18 DIAGNOSIS — L723 Sebaceous cyst: Secondary | ICD-10-CM | POA: Insufficient documentation

## 2010-11-18 DIAGNOSIS — F17200 Nicotine dependence, unspecified, uncomplicated: Secondary | ICD-10-CM | POA: Insufficient documentation

## 2010-11-18 DIAGNOSIS — Z01812 Encounter for preprocedural laboratory examination: Secondary | ICD-10-CM | POA: Insufficient documentation

## 2010-11-18 DIAGNOSIS — B1920 Unspecified viral hepatitis C without hepatic coma: Secondary | ICD-10-CM | POA: Insufficient documentation

## 2010-11-21 ENCOUNTER — Encounter (INDEPENDENT_AMBULATORY_CARE_PROVIDER_SITE_OTHER): Payer: Self-pay | Admitting: Nurse Practitioner

## 2010-11-21 LAB — POCT HEMOGLOBIN-HEMACUE: Hemoglobin: 16.2 g/dL (ref 13.0–17.0)

## 2010-11-24 NOTE — Op Note (Signed)
  [name], [name] NO.:  [id number]  MEDICAL RECORD NO.:  [id number]           PATIENT TYPE:  LOCATION:                                 FACILITY:  PHYSICIAN:  Abigail Miyamoto, M.D. DATE OF BIRTH:  1961/06/18  DATE OF PROCEDURE:  11/18/2010 DATE OF DISCHARGE:                              OPERATIVE REPORT   PREOPERATIVE DIAGNOSIS:  Chronic sebaceous cyst x2, right shoulder and right neck.  POSTOPERATIVE DIAGNOSIS:  Chronic sebaceous cyst x2, right shoulder and right neck.  PROCEDURE:  Excision of sebaceous cyst, right neck and right shoulder (1 cm each).  SURGEON:  Abigail Miyamoto, MD  ANESTHESIA:  Monitored anesthesia care and 1% lidocaine.  ESTIMATED BLOOD LOSS:  Minimal.  INDICATIONS:  [name] [name] is a 50 year old gentleman with hepatitis C who has had infection of the sebaceous cyst on his right posterior shoulder.  Because of chronic infections, we have decided to excise this area as well as an adjacent sebaceous cyst on his right neck.  PROCEDURE IN DETAIL:  The patient was brought to the operating room and identified as [name] [name].  He was placed supine on the operating table and anesthesia was induced.  His right shoulder and neck were prepped and draped after the patient was placed in left lateral decubitus position.  I anesthetized the skin around each of the lesions with 1% lidocaine.  I made a small incision over top of the small sebaceous cyst on the neck with a scalpel.  I took this down to the cyst which was removed in its entirety with a scalpel.  It was sent to Pathology for evaluation.  Next, I anesthetized the skin of the shoulder and performed elliptical incision around the patient's previous scar. This was taken down to the subcutaneous tissue with electrocautery.  I then completed the wide excision of cyst with the electrocautery as well.  Both areas were a centimeter in size.  At this point, I closed both wounds with  interrupted 3-0 Vicryl sutures and running 4-0 Monocryl.  Steri-Strips, gauze, and tape were applied.  The patient tolerated the procedure well.  All counts were correct at the end of the procedure.  The patient was then extubated in the operating room and taken in stable condition to the recovery room.     Abigail Miyamoto, M.D.     DB/MEDQ  D:  11/18/2010  T:  11/19/2010  Job:  [id number]  Electronically Signed by Abigail Miyamoto M.D. on 11/24/2010 09:34:37 AM

## 2010-11-29 NOTE — Letter (Signed)
Summary: POCT HEMOGLOBIN-HEMACUE  POCT HEMOGLOBIN-HEMACUE   Imported By: Arta Bruce 11/24/2010 13:57:58  _____________________________________________________________________  External Attachment:    Type:   Image     Comment:   External Document

## 2012-04-25 ENCOUNTER — Ambulatory Visit (INDEPENDENT_AMBULATORY_CARE_PROVIDER_SITE_OTHER): Payer: Self-pay | Admitting: Internal Medicine

## 2012-04-25 ENCOUNTER — Encounter: Payer: Self-pay | Admitting: Internal Medicine

## 2012-04-25 VITALS — BP 121/77 | HR 58 | Temp 96.7°F | Ht 70.5 in | Wt 156.9 lb

## 2012-04-25 DIAGNOSIS — A630 Anogenital (venereal) warts: Secondary | ICD-10-CM

## 2012-04-25 DIAGNOSIS — G4700 Insomnia, unspecified: Secondary | ICD-10-CM

## 2012-04-25 DIAGNOSIS — Z0000 Encounter for general adult medical examination without abnormal findings: Secondary | ICD-10-CM

## 2012-04-25 DIAGNOSIS — F17200 Nicotine dependence, unspecified, uncomplicated: Secondary | ICD-10-CM

## 2012-04-25 DIAGNOSIS — B1920 Unspecified viral hepatitis C without hepatic coma: Secondary | ICD-10-CM

## 2012-04-25 DIAGNOSIS — B1710 Acute hepatitis C without hepatic coma: Secondary | ICD-10-CM

## 2012-04-25 DIAGNOSIS — G8929 Other chronic pain: Secondary | ICD-10-CM

## 2012-04-25 DIAGNOSIS — J984 Other disorders of lung: Secondary | ICD-10-CM

## 2012-04-25 DIAGNOSIS — R911 Solitary pulmonary nodule: Secondary | ICD-10-CM

## 2012-04-25 DIAGNOSIS — M25569 Pain in unspecified knee: Secondary | ICD-10-CM

## 2012-04-25 MED ORDER — CYCLOBENZAPRINE HCL 10 MG PO TABS: 10.0000 mg | ORAL_TABLET | ORAL | Status: DC

## 2012-04-25 MED ORDER — NABUMETONE 500 MG PO TABS: 500.0000 mg | ORAL_TABLET | Freq: Two times a day (BID) | ORAL | Status: DC | PRN

## 2012-04-25 MED ORDER — TRAZODONE 25 MG HALF TABLET: 25.0000 mg | ORAL_TABLET | Freq: Every evening | ORAL | Status: DC | PRN

## 2012-04-25 NOTE — Progress Notes (Signed)
Patient ID: [name] [name], male   DOB: [birth year], 51 y.o.   MRN: [id number]  Subjective:   Patient ID: [name] [name] male   DOB: 07/06/1961 51 y.o.   MRN: [id number]  HPI: [name] [name] is a 51 y.o. male with PMH Hepatitis C, PTSD, Depression, and pulmonary nodule, with questionable h/o COPD, who presents to establish himself as a new patient, previously seen at [facility].   He has a h/o of Hepatits C, dx around 2010 or 2011. He thinks he aquired it from previous IV drug use in the '80s or possibly from serving multiple stents in jail. He recently finished treatment for his Hep C at [facility] 7-8 weeks ago. He has been viral free so far, and was recently retested, but is unsure of the results. He also received Hep A and B vaccinations recently. While at [facility], he thinks he received his Tdap and Pneumococcal vaccine. His next visit to [facility] is in October.   He does have a h/o pain in his left knee and hip, and in his lumbar spine, but states that since he completed his Hep C treatment, he has been virtually pain free. He does take Nabumetone and Flexeril once or twice a month for his pain.   He has a h/o depression and PTSD after finding he dead brother many years ago. He states that his depression and PTSD were present when he was using drugs, most recently when smoking crack. He denies any depression or PTSD symptoms in 3-4 years, when he quit using drugs. He denies ever taking antidepressants, but he does take Trazodone to help him sleep.  He has a remote diagnosis of COPD, but has never had a chest CT or spirometry. He states that he recently quit smoking (less than 1 weeks duration) but was smoking 1ppd x 29ys. He did have a Ventrolin inhaler which he never used. He is not limited with exertion or exercise. He does have a pulmonary nodule that has been stable at 4mm per CXR from 4/11 compared to CXR from 12/06.    Past Medical History  Diagnosis Date  . PULMONARY NODULE 12/21/2009    Stable  4mm per repeat CXR 4/11  . Hepatitis C     S/p completing treatment at [facility]  . PTSD (post-traumatic stress disorder)     Found deceased brother- resolved per pt  . Depression     Resolved- pt contributes to previous drug use  . Tobacco abuse     1 ppd x 29 ys. Quit 04/22/12  . IV drug abuse     Quit. Used in the 80s   Current Outpatient Prescriptions  Medication Sig Dispense Refill  . cyclobenzaprine (FLEXERIL) 10 MG tablet Take 1 tablet (10 mg total) by mouth 1 day or 1 dose.  30 tablet  1  . nabumetone (RELAFEN) 500 MG tablet Take 1 tablet (500 mg total) by mouth 2 (two) times daily as needed for pain.  60 tablet  1  . traZODone (DESYREL) 25 mg TABS Take 0.5 tablets (25 mg total) by mouth at bedtime as needed.  30 tablet  1   Family History  Problem Relation Age of Onset  . Hyperlipidemia Mother   . Hypertension Mother   . COPD Mother   . Heart disease Father     Decease from MI at 36yo  . Cancer Maternal Grandfather     Colon cancer, deceased  . Alcohol abuse Maternal Grandfather    History  Social History  . Marital Status: Married    Spouse Name: N/A    Number of Children: N/A  . Years of Education: N/A   Social History Main Topics  . Smoking status: Former Smoker -- 1.0 packs/day for 29 years    Types: Cigarettes    Quit date: 04/22/2012  . Smokeless tobacco: None   Comment: Stopped x 3-4 days  . Alcohol Use: No  . Drug Use: Yes    Special: "Crack" cocaine, Heroin     Quit IV drugs in the '80s. Last crack use 2009 or 2010.  Marland Kitchen Sexually Active: Yes -- Male partner(s)   Other Topics Concern  . None   Social History Narrative  . None   Review of Systems: Constitutional: Denies fever, chills, diaphoresis, appetite change and fatigue.  HEENT: Denies photophobia, eye pain, redness, hearing loss, ear pain, congestion, sore throat, rhinorrhea, sneezing, mouth sores, trouble swallowing, neck pain, neck stiffness and tinnitus.   Respiratory: Denies SOB, DOE,  cough, chest tightness,  and wheezing.   Cardiovascular: Denies chest pain, palpitations and leg swelling.  Gastrointestinal: Denies nausea, vomiting, abdominal pain, diarrhea, constipation, blood in stool and abdominal distention.  Genitourinary: Denies dysuria, urgency, frequency, hematuria, flank pain and difficulty urinating.  Musculoskeletal: Denies myalgias, back pain, joint swelling, arthralgias and gait problem.  Skin: Denies pallor, rash and wound.  Neurological: Denies dizziness, seizures, syncope, weakness, light-headedness, numbness and headaches.  Hematological: Denies adenopathy. Easy bruising, personal or family bleeding history  Psychiatric/Behavioral: Endorses difficulty falling asleep. Denies suicidal ideation, mood changes, confusion, nervousness, or agitation.  Objective:  Physical Exam: Filed Vitals:   04/25/12 1524  BP: 121/77  Pulse: 58  Temp: 96.7 F (35.9 C)  TempSrc: Oral  Height: 5' 10.5" (1.791 m)  Weight: 156 lb 14.4 oz (71.169 kg)  SpO2: 98%   Constitutional: Vital signs reviewed.  Patient is a well-developed and well-nourished male in no acute distress and cooperative with exam. Alert and oriented x3.  Head: Normocephalic and atraumatic Ear: TM normal bilaterally Mouth: Poor dention, MMM Eyes: PERRL, EOMI, conjunctivae normal, No scleral icterus.  Neck: Supple, Trachea midline normal ROM, No JVD, mass, thyromegaly, or carotid bruit present.  Cardiovascular: RRR, S1 normal, S2 normal, no MRG, pulses symmetric and intact bilaterally Pulmonary/Chest: CTAB, no wheezes, rales, or rhonchi Abdominal: Soft. Non-tender, non-distended, bowel sounds are normal, no masses, organomegaly, or guarding present.  GU: No CVA tenderness Musculoskeletal: No joint deformities, erythema, or stiffness, ROM full and no nontender Hematology: No adenopathy appreciated.  Neurological: A&O x3, Strength is normal and symmetric bilaterally, cranial nerve II-XII are grossly intact,  no focal motor deficit, sensory intact to light touch bilaterally.  Skin: Warm, dry and intact. No rash, cyanosis, or clubbing.  Psychiatric: Normal mood and affect. speech and behavior is normal. Judgment and thought content normal. Cognition and memory are normal.   Assessment & Plan:   Please refer to Problem List based Assessment and Planning.

## 2012-04-25 NOTE — Patient Instructions (Addendum)
Continue to take your Nabumetone, Flexeril, and Trazodone as prescribed.  Keep your appointment at [facility] regarding your Hepatitis C.  Please call the clinic and report which vaccinations were given at your last visit to [facility]. Follow up in clinic in 1 year.   Smoking Cessation, Tips for Success YOU CAN QUIT SMOKING If you are ready to quit smoking, congratulations! You have chosen to help yourself be healthier. Cigarettes bring nicotine, tar, carbon monoxide, and other irritants into your body. Your lungs, heart, and blood vessels will be able to work better without these poisons. There are many different ways to quit smoking. Nicotine gum, nicotine patches, a nicotine inhaler, or nicotine nasal spray can help with physical craving. Hypnosis, support groups, and medicines help break the habit of smoking. Here are some tips to help you quit for good.  Throw away all cigarettes.   Clean and remove all ashtrays from your home, work, and car.   On a card, write down your reasons for quitting. Carry the card with you and read it when you get the urge to smoke.   Cleanse your body of nicotine. Drink enough water and fluids to keep your urine clear or pale yellow. Do this after quitting to flush the nicotine from your body.   Learn to predict your moods. Do not let a bad situation be your excuse to have a cigarette. Some situations in your life might tempt you into wanting a cigarette.   Never have "just one" cigarette. It leads to wanting another and another. Remind yourself of your decision to quit.   Change habits associated with smoking. If you smoked while driving or when feeling stressed, try other activities to replace smoking. Stand up when drinking your coffee. Brush your teeth after eating. Sit in a different chair when you read the paper. Avoid alcohol while trying to quit, and try to drink fewer caffeinated beverages. Alcohol and caffeine may urge you to smoke.   Avoid foods and drinks that  can trigger a desire to smoke, such as sugary or spicy foods and alcohol.   Ask people who smoke not to smoke around you.   Have something planned to do right after eating or having a cup of coffee. Take a walk or exercise to perk you up. This will help to keep you from overeating.   Try a relaxation exercise to calm you down and decrease your stress. Remember, you may be tense and nervous for the first 2 weeks after you quit, but this will pass.   Find new activities to keep your hands busy. Play with a pen, coin, or rubber band. Doodle or draw things on paper.   Brush your teeth right after eating. This will help cut down on the craving for the taste of tobacco after meals. You can try mouthwash, too.   Use oral substitutes, such as lemon drops, carrots, a cinnamon stick, or chewing gum, in place of cigarettes. Keep them handy so they are available when you have the urge to smoke.   When you have the urge to smoke, try deep breathing.   Designate your home as a nonsmoking area.   If you are a heavy smoker, ask your caregiver about a prescription for nicotine chewing gum. It can ease your withdrawal from nicotine.   Reward yourself. Set aside the cigarette money you save and buy yourself something nice.   Look for support from others. Join a support group or smoking cessation program. Ask someone at  home or at work to help you with your plan to quit smoking.   Always ask yourself, "Do I need this cigarette or is this just a reflex?" Tell yourself, "Today, I choose not to smoke," or "I do not want to smoke." You are reminding yourself of your decision to quit, even if you do smoke a cigarette.  HOW WILL I FEEL WHEN I QUIT SMOKING?  The benefits of not smoking start within days of quitting.   You may have symptoms of withdrawal because your body is used to nicotine (the addictive substance in cigarettes). You may crave cigarettes, be irritable, feel very hungry, cough often, get  headaches, or have difficulty concentrating.   The withdrawal symptoms are only temporary. They are strongest when you first quit but will go away within 10 to 14 days.   When withdrawal symptoms occur, stay in control. Think about your reasons for quitting. Remind yourself that these are signs that your body is healing and getting used to being without cigarettes.   Remember that withdrawal symptoms are easier to treat than the major diseases that smoking can cause.   Even after the withdrawal is over, expect periodic urges to smoke. However, these cravings are generally short-lived and will go away whether you smoke or not. Do not smoke!   If you relapse and smoke again, do not lose hope. Most smokers quit 3 times before they are successful.   If you relapse, do not give up! Plan ahead and think about what you will do the next time you get the urge to smoke.  LIFE AS A NONSMOKER: MAKE IT FOR A MONTH, MAKE IT FOR LIFE Day 1: Hang this page where you will see it every day. Day 2: Get rid of all ashtrays, matches, and lighters. Day 3: Drink water. Breathe deeply between sips. Day 4: Avoid places with smoke-filled air, such as bars, clubs, or the smoking section of restaurants. Day 5: Keep track of how much money you save by not smoking. Day 6: Avoid boredom. Keep a good book with you or go to the movies. Day 7: Reward yourself! One week without smoking! Day 8: Make a dental appointment to get your teeth cleaned. Day 9: Decide how you will turn down a cigarette before it is offered to you. Day 10: Review your reasons for quitting. Day 11: Distract yourself. Stay active to keep your mind off smoking and to relieve tension. Take a walk, exercise, read a book, do a crossword puzzle, or try a new hobby. Day 12: Exercise. Get off the bus before your stop or use stairs instead of escalators. Day 13: Call on friends for support and encouragement. Day 14: Reward yourself! Two weeks without  smoking! Day 15: Practice deep breathing exercises. Day 16: Bet a friend that you can stay a nonsmoker. Day 17: Ask to sit in nonsmoking sections of restaurants. Day 18: Hang up "No Smoking" signs. Day 19: Think of yourself as a nonsmoker. Day 20: Each morning, tell yourself you will not smoke. Day 21: Reward yourself! Three weeks without smoking! Day 22: Think of smoking in negative ways. Remember how it stains your teeth, gives you bad breath, and leaves you short of breath. Day 23: Eat a nutritious breakfast. Day 24:Do not relive your days as a smoker. Day 25: Hold a pencil in your hand when talking on the telephone. Day 26: Tell all your friends you do not smoke. Day 27: Think about how much better food tastes.  Day 28: Remember, one cigarette is one too many. Day 29: Take up a hobby that will keep your hands busy. Day 30: Congratulations! One month without smoking! Give yourself a big reward. Your caregiver can direct you to community resources or hospitals for support, which may include:  Group support.   Education.   Hypnosis.   Subliminal therapy.  Document Released: 05/12/2004 Document Revised: 08/03/2011 Document Reviewed: 05/31/2009 [facility] Patient Information 2012 [place], [place].

## 2012-04-27 DIAGNOSIS — G4700 Insomnia, unspecified: Secondary | ICD-10-CM | POA: Insufficient documentation

## 2012-04-27 DIAGNOSIS — Z0000 Encounter for general adult medical examination without abnormal findings: Secondary | ICD-10-CM | POA: Insufficient documentation

## 2012-04-27 NOTE — Assessment & Plan Note (Signed)
He states that he quit smoking 3 days prior to his clinic visit. He understands why he needs to not smoke. Hopefully he will be able to stay away from the cigarettes. Per pt, he has no difficulty breathing and is not limited with exertion. Will reassess at his next clinic visit.

## 2012-04-27 NOTE — Assessment & Plan Note (Signed)
He states that he received his Tdap and Pneumococcal vaccine at [facility]. His wife said she would bring in the information.

## 2012-04-27 NOTE — Assessment & Plan Note (Signed)
After receiving treatment, his labs have been negative. He has received his Hep A&B vaccinations. He is seen at [facility] for his Hepatitis C, and is to continue to follow up with them. His next appt is in October.

## 2012-04-27 NOTE — Assessment & Plan Note (Signed)
Per CXRs, pulmonary nodule is stable. He denies any respiratory symptoms. Will reevaluate at next clinc visit.

## 2012-04-27 NOTE — Assessment & Plan Note (Signed)
He was treated multiple times with podophyllin in the past without resolution, and ultimately was referred to Dermatology to have the condyloma burned off. No reoccurance since, per pt and his wife.

## 2012-04-27 NOTE — Assessment & Plan Note (Signed)
He takes Trazodone to help him sleep, which was refilled at this clinic visit.

## 2012-04-27 NOTE — Assessment & Plan Note (Addendum)
He takes Nabumetone and Flexeril once or twice a month for back and left knee and hip pain. No deficits to ROM or tenderness to palpation were noted. He was given refills of these at this visit, which should last him for the year, if he really takes them that infrequently.

## 2012-05-10 ENCOUNTER — Ambulatory Visit: Payer: Self-pay | Admitting: Internal Medicine

## 2012-05-12 NOTE — Progress Notes (Signed)
INTERNAL MEDICINE TEACHING ATTENDING ADDENDUM - [name], MD: I personally saw and evaluated Mr. [name] [name] in this clinic visit in conjunction with the resident, Dr. Sherrine Maples. I have discussed patient's plan of care with medical resident during this visit. I have confirmed the physical exam findings and have read and agree with the clinic note.

## 2012-08-08 ENCOUNTER — Encounter: Payer: Self-pay | Admitting: Internal Medicine

## 2012-08-08 ENCOUNTER — Ambulatory Visit (INDEPENDENT_AMBULATORY_CARE_PROVIDER_SITE_OTHER): Payer: No Typology Code available for payment source | Admitting: Internal Medicine

## 2012-08-08 VITALS — BP 114/69 | HR 55 | Temp 96.5°F | Ht 70.0 in | Wt 153.3 lb

## 2012-08-08 DIAGNOSIS — F17200 Nicotine dependence, unspecified, uncomplicated: Secondary | ICD-10-CM

## 2012-08-08 DIAGNOSIS — B1920 Unspecified viral hepatitis C without hepatic coma: Secondary | ICD-10-CM

## 2012-08-08 DIAGNOSIS — A630 Anogenital (venereal) warts: Secondary | ICD-10-CM

## 2012-08-08 DIAGNOSIS — Z23 Encounter for immunization: Secondary | ICD-10-CM

## 2012-08-08 DIAGNOSIS — Z299 Encounter for prophylactic measures, unspecified: Secondary | ICD-10-CM

## 2012-08-08 DIAGNOSIS — Z0000 Encounter for general adult medical examination without abnormal findings: Secondary | ICD-10-CM

## 2012-08-08 DIAGNOSIS — B1710 Acute hepatitis C without hepatic coma: Secondary | ICD-10-CM

## 2012-08-08 NOTE — Assessment & Plan Note (Signed)
Flu vaccine given today, 08/08/12

## 2012-08-08 NOTE — Assessment & Plan Note (Addendum)
  Assessment:  Progress toward smoking cessation:   Pt trying to quit  Barriers to progress toward smoking cessation:   None    Plan:  Instruction/counseling given:  I counseled patient on the dangers of tobacco use, advised patient to stop smoking and reviewed strategies to maximize success.   Medications to assist with smoking cessation:  Nicotine Patch which the pt has at home  Pt down to 1/2 ppd. Trying to quit. Counseled the pt on cessation. He is going to try the nicotine patch, which he says helped in the past. Provided the pt with 1-800-QUITNOW information.

## 2012-08-08 NOTE — Progress Notes (Signed)
Patient ID: [name] [name], male   DOB: 1961-02-14, 51 y.o.   MRN: [id number]  Subjective:   Patient ID: [name] [name] male   DOB: [birth year] 51 y.o.   MRN: [id number]  HPI: [name] [name] is a 51 y.o. male with PMH Hepatitis C and condyloma acuminata who presents to the clinic for evaluation of his condyloma.  His condyloma has previously been refractory to podophyllin therapy, and the pt ultimatley had to be seen by Dermatology to have the condyloma removed. He states that even after seeing the Dermatologist, the condyloma was not completely removed; however this is contrary to what was reported at his initial visit.  His Hepatitis C remains well controlled. He was seen at [facility] in October and his viral load was undetectable. He is to f.u in January, which will be his 10mo f/u.  He does continue to smoke, down to 1/2ppd. He states that he is feeling better with his reduced cigarette use. He wants to quit and states that he will try the nicotine patch which seemed to help him in the past.   Past Medical History  Diagnosis Date  . PULMONARY NODULE 12/21/2009    Stable 4mm per repeat CXR 4/11  . Hepatitis C     S/p completing treatment at [facility]  . PTSD (post-traumatic stress disorder)     Found deceased brother- resolved per pt  . Depression     Resolved- pt contributes to previous drug use  . Tobacco abuse     1 ppd x 29 ys. Quit 04/22/12  . IV drug abuse     Quit. Used in the 80s   Current Outpatient Prescriptions  Medication Sig Dispense Refill  . cyclobenzaprine (FLEXERIL) 10 MG tablet Take 1 tablet (10 mg total) by mouth 1 day or 1 dose.  30 tablet  1  . nabumetone (RELAFEN) 500 MG tablet Take 1 tablet (500 mg total) by mouth 2 (two) times daily as needed for pain.  60 tablet  1  . traZODone (DESYREL) 25 mg TABS Take 0.5 tablets (25 mg total) by mouth at bedtime as needed.  30 tablet  1   Family History  Problem Relation Age of Onset  . Hyperlipidemia Mother   . Hypertension  Mother   . COPD Mother   . Heart disease Father     Decease from MI at 73yo  . Cancer Maternal Grandfather     Colon cancer, deceased  . Alcohol abuse Maternal Grandfather    History   Social History  . Marital Status: Married    Spouse Name: N/A    Number of Children: N/A  . Years of Education: N/A   Social History Main Topics  . Smoking status: Current Every Day Smoker -- 0.5 packs/day for 29 years    Types: Cigarettes    Last Attempt to Quit: 04/22/2012  . Smokeless tobacco: None     Comment: Stopped x 3-4 days  . Alcohol Use: No  . Drug Use: Yes    Special: "Crack" cocaine, Heroin     Comment: Quit IV drugs in the '80s. Last crack use 2009 or 2010.  Marland Kitchen Sexually Active: Yes -- Male partner(s)   Other Topics Concern  . None   Social History Narrative  . None   Review of Systems: Constitutional: Denies fever, chills, diaphoresis, appetite change and fatigue.  HEENT: Denies photophobia, eye pain, redness, hearing loss, ear pain, congestion, sore throat, rhinorrhea, sneezing, mouth sores, trouble swallowing, neck pain,  neck stiffness and tinnitus.   Respiratory: Denies SOB, DOE, cough, chest tightness,  and wheezing.   Cardiovascular: Denies chest pain, palpitations and leg swelling.  Gastrointestinal: Denies nausea, vomiting, abdominal pain, diarrhea, constipation, blood in stool and abdominal distention.  Genitourinary: Denies dysuria, urgency, frequency, hematuria, flank pain and difficulty urinating.  Musculoskeletal: Denies myalgias, back pain, joint swelling, arthralgias and gait problem.  Skin: Denies pallor, rash and wound.  Neurological: Denies dizziness, seizures, syncope, weakness, light-headedness, numbness and headaches.  Psychiatric/Behavioral: Denies suicidal ideation or mood changes.  Objective:  Physical Exam: Filed Vitals:   08/08/12 1337  BP: 114/69  Pulse: 55  Temp: 96.5 F (35.8 C)  TempSrc: Oral  Height: 5\' 10"  (1.778 m)  Weight: 153 lb  4.8 oz (69.536 kg)  SpO2: 97%   Constitutional: Vital signs reviewed.  Patient is a well-developed and well-nourished male in no acute distress and cooperative with exam.  Head: Normocephalic and atraumatic Mouth: Poor dentition Eyes: PERRL, EOMI Cardiovascular: RRR, no MRG Pulmonary/Chest: CTAB, no wheezes, rales, or rhonchi Abdominal: Soft. Non-tender, non-distended GU: Small papules scattered on the posterior penis with one located on the right anterior scrotum Musculoskeletal: No joint deformities, ROM full Neurological: A&O x3, non focal  Psychiatric: Normal mood and affect.   Assessment & Plan:   Please refer to Problem List based Assessment and Plan Pt to f/u in 1 year or sooner as needed.

## 2012-08-08 NOTE — Patient Instructions (Addendum)
Genital Warts Genital warts are a sexually transmitted infection. They may appear as small bumps on the tissues of the genital area. CAUSES  Genital warts are caused by a virus called human papillomavirus (HPV). HPV is the most common sexually transmitted disease (STD) and infection of the sex organs. This infection is spread by having unprotected sex with an infected person. It can be spread by vaginal, anal, and oral sex. Many people do not know they are infected. They may be infected for years without problems. However, even if they do not have problems, they can unknowingly pass the infection to their sexual partners. SYMPTOMS   Itching and irritation in the genital area.  Warts that bleed.  Painful sexual intercourse. DIAGNOSIS  Warts are usually recognized with the naked eye on the vagina, vulva, perineum, anus, and rectum. Certain tests can also diagnose genital warts, such as:  A Pap test.  A tissue sample (biopsy) exam.  Colposcopy. A magnifying tool is used to examine the vagina and cervix. The HPV cells will change color when certain solutions are used. TREATMENT  Warts can be removed by:  Applying certain chemicals, such as cantharidin or podophyllin.  Liquid nitrogen freezing (cryotherapy).  Immunotherapy with candida or trichophyton injections.  Laser treatment.  Burning with an electrified probe (electrocautery).  Interferon injections.  Surgery. PREVENTION  HPV vaccination can help prevent HPV infections that cause genital warts and that cause cancer of the cervix. It is recommended that the vaccination be given to people between the ages 9 to 26 years old. The vaccine might not work as well or might not work at all if you already have HPV. It should not be given to pregnant women. HOME CARE INSTRUCTIONS   It is important to follow your caregiver's instructions. The warts will not go away without treatment. Repeat treatments are often needed to get rid of warts.  Even after it appears that the warts are gone, the normal tissue underneath often remains infected.  Do not try to treat genital warts with medicine used to treat hand warts. This type of medicine is strong and can burn the skin in the genital area, causing more damage.  Tell your past and current sexual partner(s) that you have genital warts. They may be infected also and need treatment.  Avoid sexual contact while being treated.  Do not touch or scratch the warts. The infection may spread to other parts of your body.  Women with genital warts should have a cervical cancer check (Pap test) at least once a year. This type of cancer is slow-growing and can be cured if found early. Chances of developing cervical cancer are increased with HPV.  Inform your obstetrician about your warts in the event of pregnancy. This virus can be passed to the baby's respiratory tract. Discuss this with your caregiver.  Use a condom during sexual intercourse. Following treatment, the use of condoms will help prevent reinfection.  Ask your caregiver about using over-the-counter anti-itch creams. SEEK MEDICAL CARE IF:   Your treated skin becomes red, swollen, or painful.  You have a fever.  You feel generally ill.  You feel little lumps in and around your genital area.  You are bleeding or have painful sexual intercourse. MAKE SURE YOU:   Understand these instructions.  Will watch your condition.  Will get help right away if you are not doing well or get worse. Document Released: 08/11/2000 Document Revised: 11/06/2011 Document Reviewed: 02/20/2011 Exit[employer] Patient Information 2013 [employer].  

## 2012-08-08 NOTE — Assessment & Plan Note (Signed)
Pt to f/u in January for 15mo visit. Last visit in October, per pt undetectable viral load.

## 2012-08-08 NOTE — Assessment & Plan Note (Signed)
Small warts present on the posterior penis and on the right scrotum. Will refer to Dermatology as they have been refractory to topical therapy in the past.

## 2012-08-09 NOTE — Progress Notes (Signed)
INTERNAL MEDICINE TEACHING ATTENDING ADDENDUM -Jonah Blue, DO: I reviewed with the resident Dr. Sherrine Maples, the medical history, physical examination, diagnosis and results of tests and treatment and I agree with the patient's care as documented.

## 2013-01-22 ENCOUNTER — Emergency Department (HOSPITAL_COMMUNITY)
Admission: EM | Admit: 2013-01-22 | Discharge: 2013-01-22 | Disposition: A | Discharge status: Home / Self Care | Payer: No Typology Code available for payment source | Attending: Emergency Medicine | Admitting: Emergency Medicine

## 2013-01-22 ENCOUNTER — Encounter (HOSPITAL_COMMUNITY): Payer: Self-pay

## 2013-01-22 DIAGNOSIS — S058X9A Other injuries of unspecified eye and orbit, initial encounter: Secondary | ICD-10-CM | POA: Insufficient documentation

## 2013-01-22 DIAGNOSIS — Y9389 Activity, other specified: Secondary | ICD-10-CM | POA: Insufficient documentation

## 2013-01-22 DIAGNOSIS — F329 Major depressive disorder, single episode, unspecified: Secondary | ICD-10-CM | POA: Insufficient documentation

## 2013-01-22 DIAGNOSIS — H1132 Conjunctival hemorrhage, left eye: Secondary | ICD-10-CM

## 2013-01-22 DIAGNOSIS — X58XXXA Exposure to other specified factors, initial encounter: Secondary | ICD-10-CM | POA: Insufficient documentation

## 2013-01-22 DIAGNOSIS — F3289 Other specified depressive episodes: Secondary | ICD-10-CM | POA: Insufficient documentation

## 2013-01-22 DIAGNOSIS — S0010XA Contusion of unspecified eyelid and periocular area, initial encounter: Secondary | ICD-10-CM | POA: Insufficient documentation

## 2013-01-22 DIAGNOSIS — Z8659 Personal history of other mental and behavioral disorders: Secondary | ICD-10-CM | POA: Insufficient documentation

## 2013-01-22 DIAGNOSIS — Z8619 Personal history of other infectious and parasitic diseases: Secondary | ICD-10-CM | POA: Insufficient documentation

## 2013-01-22 DIAGNOSIS — F17200 Nicotine dependence, unspecified, uncomplicated: Secondary | ICD-10-CM | POA: Insufficient documentation

## 2013-01-22 DIAGNOSIS — Y929 Unspecified place or not applicable: Secondary | ICD-10-CM | POA: Insufficient documentation

## 2013-01-22 DIAGNOSIS — Z8709 Personal history of other diseases of the respiratory system: Secondary | ICD-10-CM | POA: Insufficient documentation

## 2013-01-22 DIAGNOSIS — S0502XA Injury of conjunctiva and corneal abrasion without foreign body, left eye, initial encounter: Secondary | ICD-10-CM

## 2013-01-22 DIAGNOSIS — Z79899 Other long term (current) drug therapy: Secondary | ICD-10-CM | POA: Insufficient documentation

## 2013-01-22 MED ORDER — FLUORESCEIN SODIUM 1 MG OP STRP
1.0000 | ORAL_STRIP | Freq: Once | OPHTHALMIC | Status: AC
  Administered 2013-01-22: 1 via OPHTHALMIC
  Filled 2013-01-22: qty 1

## 2013-01-22 MED ORDER — ERYTHROMYCIN 5 MG/GM OP OINT: TOPICAL_OINTMENT | OPHTHALMIC | Status: DC

## 2013-01-22 MED ORDER — TETRACAINE HCL 0.5 % OP SOLN
1.0000 [drp] | Freq: Once | OPHTHALMIC | Status: AC
  Administered 2013-01-22: 1 [drp] via OPHTHALMIC
  Filled 2013-01-22: qty 2

## 2013-01-22 NOTE — ED Provider Notes (Signed)
Medical screening examination/treatment/procedure(s) were performed by non-physician practitioner and as supervising physician I was immediately available for consultation/collaboration.  Domonik Levario R. Anastacia Reinecke, MD 01/22/13 2330 

## 2013-01-22 NOTE — ED Notes (Signed)
Pt presents NAD - weeding with no safety gear. Left eye no visual changes

## 2013-01-22 NOTE — ED Provider Notes (Signed)
History    This chart was scribed for Roxy Horseman, PA working with Juliet Rude. Rubin Payor, MD by ED Scribe, Burman Nieves. This patient was seen in room WTR7/WTR7 and the patient's care was started at 4:29 PM.   CSN: [id number]  Arrival date & time 01/22/13  1556   First MD Initiated Contact with Patient 01/22/13 1629      Chief Complaint  Patient presents with  . Eye Injury    (Consider location/radiation/quality/duration/timing/severity/associated sxs/prior treatment) The history is provided by the patient. No language interpreter was used.   HPI Comments: [name] [name] is a 52 y.o. male with h/o hepatitis c, PTSD, and depression who presents to the Emergency Department complaining of mild left eye irritation when blinking with no associated visual changes onset earlier this afternoon. Pt states that he was weed eating earlier this afternoon with out safety glasses when a foreign object (believes it was a small rock) was slung up into his left eye. Pt wiped his left eye with his shirt and noticed some blood so he decided to come in to the ED. He does not believe the object is still in his eye. Pt denies fever, chills, cough, nausea, vomiting, diarrhea, SOB, weakness, and any other associated symptoms. Pt's PCP is Dr. Sherrine Maples.     Past Medical History  Diagnosis Date  . PULMONARY NODULE 12/21/2009    Stable 4mm per repeat CXR 4/11  . Hepatitis C     S/p completing treatment at [facility]  . PTSD (post-traumatic stress disorder)     Found deceased brother- resolved per pt  . Depression     Resolved- pt contributes to previous drug use  . Tobacco abuse     1 ppd x 29 ys. Quit 04/22/12  . IV drug abuse     Quit. Used in the 80s    Past Surgical History  Procedure Laterality Date  . Fracture surgery      In high school    Family History  Problem Relation Age of Onset  . Hyperlipidemia Mother   . Hypertension Mother   . COPD Mother   . Heart disease Father     Decease from MI at  19yo  . Cancer Maternal Grandfather     Colon cancer, deceased  . Alcohol abuse Maternal Grandfather     History  Substance Use Topics  . Smoking status: Current Every Day Smoker -- 0.50 packs/day for 29 years    Types: Cigarettes    Last Attempt to Quit: 04/22/2012  . Smokeless tobacco: Not on file     Comment: Stopped x 3-4 days  . Alcohol Use: No      Review of Systems  Eyes: Positive for pain.  All other systems reviewed and are negative.    Allergies  Review of patient's allergies indicates no known allergies.  Home Medications   Current Outpatient Rx  Name  Route  Sig  Dispense  Refill  . cyclobenzaprine (FLEXERIL) 10 MG tablet   Oral   Take 1 tablet (10 mg total) by mouth 1 day or 1 dose.   30 tablet   1   . nabumetone (RELAFEN) 500 MG tablet   Oral   Take 1 tablet (500 mg total) by mouth 2 (two) times daily as needed for pain.   60 tablet   1   . traZODone (DESYREL) 25 mg TABS   Oral   Take 0.5 tablets (25 mg total) by mouth at bedtime as needed.  30 tablet   1     BP 103/75  Pulse 74  Temp(Src) 98.1 F (36.7 C) (Oral)  Resp 20  SpO2 97%  Physical Exam  Nursing note and vitals reviewed. Constitutional: He is oriented to person, place, and time. He appears well-developed and well-nourished. No distress.  HENT:  Head: Normocephalic and atraumatic.  Eyes: EOM are normal.    Left eye remarkable for medial subconjunctival hematoma. Eye pressure is 15. No foreign body seen on slit lamp exam. Small area of florizine uptake as diagrammed. Eye lid everted, no pain with eye movement.   Neck: Neck supple. No tracheal deviation present.  Cardiovascular: Normal rate.   Pulmonary/Chest: Effort normal. No respiratory distress.  Musculoskeletal: Normal range of motion.  Neurological: He is alert and oriented to person, place, and time.  Skin: Skin is warm and dry.  Psychiatric: He has a normal mood and affect. His behavior is normal.    ED Course   Procedures (including critical care time) DIAGNOSTIC STUDIES: Oxygen Saturation is 97% on room air, adequate by my interpretation.    COORDINATION OF CARE: 5:02 PM Discussed with pt that a slit lamp exam will be done for further evaluation.  5:20 PM Administering tetracaine for slit lamp exam.  5:22 PM Slit lamp exam. Abrasion noted in left eye. 5:24 PM Eye pressure was an average of 15 over three tries.  5:26 PM Eye exam under microscope normal.  5:28 PM Come back to the ED if experiencing more pain or blurred vision. Advised pt to check up with eye doctor if sx's occur. Labs Reviewed - No data to display No results found.   1. Subconjunctival hematoma, left   2. Corneal abrasion, left, initial encounter       MDM  Patient with corneal abrasion and subconjunctival hemorrhage/hematoma. Will give Romycin, and have the patient followup with ophthalmology for new or worsening symptoms. Patient states that he is not experiencing pain. Visual acuity is unaffected.  I personally performed the services described in this documentation, which was scribed in my presence. The recorded information has been reviewed and is accurate.          Roxy Horseman, PA-C 01/22/13 2036

## 2013-01-22 NOTE — Progress Notes (Signed)
[facility] has seen patient and he already has a OC. I told him that he if he needed any follow-up care than he needed to go to Internal Medicine. Also, told him that if he needed to go to the eye doctor than Internal Medicine can give him a referral.

## 2013-02-13 ENCOUNTER — Encounter: Payer: Self-pay | Admitting: Internal Medicine

## 2013-02-20 ENCOUNTER — Encounter: Payer: Self-pay | Admitting: Internal Medicine

## 2013-02-20 ENCOUNTER — Ambulatory Visit (INDEPENDENT_AMBULATORY_CARE_PROVIDER_SITE_OTHER): Payer: No Typology Code available for payment source | Admitting: Internal Medicine

## 2013-02-20 VITALS — BP 124/77 | HR 58 | Temp 97.1°F | Ht 69.5 in | Wt 147.6 lb

## 2013-02-20 DIAGNOSIS — F17200 Nicotine dependence, unspecified, uncomplicated: Secondary | ICD-10-CM

## 2013-02-20 DIAGNOSIS — J984 Other disorders of lung: Secondary | ICD-10-CM

## 2013-02-20 DIAGNOSIS — Z0000 Encounter for general adult medical examination without abnormal findings: Secondary | ICD-10-CM

## 2013-02-20 DIAGNOSIS — Z23 Encounter for immunization: Secondary | ICD-10-CM

## 2013-02-20 DIAGNOSIS — B1710 Acute hepatitis C without hepatic coma: Secondary | ICD-10-CM

## 2013-02-20 LAB — COMPLETE METABOLIC PANEL WITH GFR
AST: 16 U/L (ref 0–37)
Albumin: 4.2 g/dL (ref 3.5–5.2)
Alkaline Phosphatase: 56 U/L (ref 39–117)
GFR, Est Non African American: 83 mL/min
Glucose, Bld: 99 mg/dL (ref 70–99)
Potassium: 4.1 mEq/L (ref 3.5–5.3)
Sodium: 138 mEq/L (ref 135–145)
Total Bilirubin: 0.6 mg/dL (ref 0.3–1.2)
Total Protein: 7.1 g/dL (ref 6.0–8.3)

## 2013-02-20 LAB — LIPID PANEL
HDL: 46 mg/dL (ref >39)
Total CHOL/HDL Ratio: 3 Ratio
VLDL: 15 mg/dL (ref 0–40)

## 2013-02-20 MED ORDER — ASPIRIN EC 81 MG PO TBEC: 81.0000 mg | DELAYED_RELEASE_TABLET | Freq: Every day | ORAL | Status: AC

## 2013-02-20 NOTE — Assessment & Plan Note (Signed)
  Assessment: Progress toward smoking cessation:  smoking the same amount Barriers to progress toward smoking cessation:  lack of motivation to quit Comments: Pt not ready to quit at this time  Plan: Instruction/counseling given:  I counseled patient on the dangers of tobacco use, advised patient to stop smoking, and reviewed strategies to maximize success. Educational resources provided:  [employer]) brochure Self management tools provided:    Medications to assist with smoking cessation:  None Patient agreed to the following self-care plans for smoking cessation:    Other plans: Provided literature on quitting pt will notify the clinic when he is ready to quit.

## 2013-02-20 NOTE — Progress Notes (Signed)
Patient ID: [name] [name], male   DOB: 06-Nov-1960, 52 y.o.   MRN: [id number]  Subjective:   Patient ID: [name] [name] male   DOB: Jan 18, 1961 52 y.o.   MRN: [id number]  HPI: [name] is a 52 y.o. M with PMH tobacco abuse and Hepatitis C s/p therapy at [facility] who presents today for a routine f/u.  He was recently seen in the ED for a corneal abraison which has resolved.  Please refer to problem focused A&P.   Past Medical History  Diagnosis Date  . PULMONARY NODULE 12/21/2009    Stable 4mm per repeat CXR 4/11  . Hepatitis C     S/p completing treatment at [facility]  . PTSD (post-traumatic stress disorder)     Found deceased brother- resolved per pt  . Depression     Resolved- pt contributes to previous drug use  . Tobacco abuse     1 ppd x 29 ys. Quit 04/22/12  . IV drug abuse     Quit. Used in the 80s   Current Outpatient Prescriptions  Medication Sig Dispense Refill  . ibuprofen (ADVIL,MOTRIN) 200 MG tablet Take 200 mg by mouth every 6 (six) hours as needed for pain.      Marland Kitchen aspirin EC 81 MG tablet Take 1 tablet (81 mg total) by mouth daily.  30 tablet  11   No current facility-administered medications for this visit.   Family History  Problem Relation Age of Onset  . Hyperlipidemia Mother   . Hypertension Mother   . COPD Mother   . Heart disease Father     Decease from MI at 16yo  . Cancer Maternal Grandfather     Colon cancer, deceased  . Alcohol abuse Maternal Grandfather    History   Social History  . Marital Status: Married    Spouse Name: N/A    Number of Children: N/A  . Years of Education: N/A   Social History Main Topics  . Smoking status: Current Every Day Smoker -- 0.50 packs/day for 29 years    Types: Cigarettes    Last Attempt to Quit: 04/22/2012  . Smokeless tobacco: None     Comment: Stopped x 3-4 days  . Alcohol Use: No  . Drug Use: Yes    Special: "Crack" cocaine, Heroin     Comment: Quit IV drugs in the '80s. Last crack use 2009 or  2010.  Marland Kitchen Sexually Active: Yes -- Male partner(s)   Other Topics Concern  . None   Social History Narrative  . None   Review of Systems: A 10 point ROS was performed; pertinent positives and negatives were noted in the HPI   Objective:  Physical Exam: Filed Vitals:   02/20/13 1346  BP: 124/77  Pulse: 58  Temp: 97.1 F (36.2 C)  TempSrc: Oral  Height: 5' 9.5" (1.765 m)  Weight: 147 lb 9.6 oz (66.951 kg)  SpO2: 97%   Constitutional: Vital signs reviewed.  Patient is a well-developed and well-nourished male in no acute distress and cooperative with exam. Alert and oriented x3.  Head: Normocephalic and atraumatic Mouth: no erythema or exudates, MMM. Missing lower front incisors.  Eyes: PERRL, EOMI, conjunctivae normal, No scleral icterus.  Neck: Supple, Trachea midline normal ROM, No JVD, mass, thyromegaly Cardiovascular: RRR, S1 normal, S2 normal, no MRG, pulses symmetric and intact bilaterally Pulmonary/Chest: normal respiratory effort, CTAB, no wheezes, rales, or rhonchi Abdominal: Soft. Non-tender, non-distended, bowel sounds are normal, no masses, organomegaly, or guarding  present.  Musculoskeletal: No joint deformities, erythema, or stiffness, ROM full and no nontender Neurological: A&O x3, Strength is normal and symmetric bilaterally, cranial nerve II-XII are grossly intact, no focal motor deficit, sensory intact to light touch bilaterally.  Skin: Warm, dry and intact. No rash, cyanosis, or clubbing.  Psychiatric: Normal mood and affect. speech and behavior is normal. Judgment and thought content normal. Cognition and memory are normal.   Assessment & Plan:   Please refer to Problem List based Assessment and Plan

## 2013-02-20 NOTE — Addendum Note (Signed)
Addended by: Youlanda Roys A on: 02/20/2013 03:12 PM   Modules accepted: Orders

## 2013-02-20 NOTE — Patient Instructions (Signed)

## 2013-02-20 NOTE — Assessment & Plan Note (Signed)
H/o Hep C, and was being seen at [facility]. He received interferon therapy there, and was told that his hepatitis has resolved but can return. He is requesting that I check to make sure his hepatitis has not returned.  - Checking Hep C viral load

## 2013-02-20 NOTE — Assessment & Plan Note (Signed)
Pt referred for colonoscopy today. Also started ASA 81mg  and checking CMP and Lipid panel.

## 2013-02-21 LAB — HEPATITIS C RNA QUANTITATIVE: HCV Quantitative: NOT DETECTED IU/mL (ref <15)

## 2013-02-26 NOTE — Progress Notes (Signed)
Case discussed with Dr. Glenn at the time of the visit.  We reviewed the resident's history and exam and pertinent patient test results.  I agree with the assessment, diagnosis, and plan of care documented in the resident's note.   

## 2013-03-17 ENCOUNTER — Telehealth: Payer: Self-pay | Admitting: *Deleted

## 2013-03-17 NOTE — Telephone Encounter (Signed)
Checking on new CXR order - no future  order in Epic. Note sent to Dr Sherrine Maples. Stanton Kidney Kalkidan Caudell RN 03/17/13 4:30PM

## 2013-03-20 ENCOUNTER — Encounter: Payer: Self-pay | Admitting: Internal Medicine

## 2013-03-20 NOTE — Telephone Encounter (Signed)
The order is now in EPIC. Thanks.

## 2013-03-20 NOTE — Addendum Note (Signed)
Addended by: Genelle Gather on: 03/20/2013 06:10 PM   Modules accepted: Orders

## 2013-03-20 NOTE — Assessment & Plan Note (Signed)
Addendum: 03/20/13  Checking CXR to evaluate pulmonary nodule- previously stable.

## 2013-04-01 ENCOUNTER — Ambulatory Visit (HOSPITAL_COMMUNITY)
Admission: RE | Admit: 2013-04-01 | Discharge: 2013-04-01 | Disposition: A | Discharge status: Home / Self Care | Payer: No Typology Code available for payment source | Source: Ambulatory Visit | Attending: Internal Medicine | Admitting: Internal Medicine

## 2013-04-01 DIAGNOSIS — R911 Solitary pulmonary nodule: Secondary | ICD-10-CM | POA: Insufficient documentation

## 2013-04-01 DIAGNOSIS — J984 Other disorders of lung: Secondary | ICD-10-CM

## 2013-04-01 DIAGNOSIS — F17200 Nicotine dependence, unspecified, uncomplicated: Secondary | ICD-10-CM | POA: Insufficient documentation

## 2013-06-02 ENCOUNTER — Ambulatory Visit (AMBULATORY_SURGERY_CENTER): Payer: No Typology Code available for payment source | Admitting: *Deleted

## 2013-06-02 VITALS — Ht 70.0 in | Wt 151.0 lb

## 2013-06-02 DIAGNOSIS — Z1211 Encounter for screening for malignant neoplasm of colon: Secondary | ICD-10-CM

## 2013-06-02 MED ORDER — MOVIPREP 100 G PO SOLR: ORAL | Status: DC

## 2013-06-02 NOTE — Progress Notes (Signed)
No allergies to eggs or soy. No problems with anesthesia.  

## 2013-06-16 ENCOUNTER — Encounter: Payer: Self-pay | Admitting: Internal Medicine

## 2013-06-16 ENCOUNTER — Ambulatory Visit (AMBULATORY_SURGERY_CENTER): Payer: No Typology Code available for payment source | Admitting: Internal Medicine

## 2013-06-16 VITALS — BP 132/77 | HR 50 | Temp 97.7°F | Resp 26 | Ht 70.0 in | Wt 151.0 lb

## 2013-06-16 DIAGNOSIS — D126 Benign neoplasm of colon, unspecified: Secondary | ICD-10-CM

## 2013-06-16 DIAGNOSIS — K5289 Other specified noninfective gastroenteritis and colitis: Secondary | ICD-10-CM

## 2013-06-16 DIAGNOSIS — Z1211 Encounter for screening for malignant neoplasm of colon: Secondary | ICD-10-CM

## 2013-06-16 MED ORDER — SODIUM CHLORIDE 0.9 % IV SOLN: 500.0000 mL | INTRAVENOUS | Status: DC

## 2013-06-16 NOTE — Progress Notes (Signed)
Stable to RR 

## 2013-06-16 NOTE — Patient Instructions (Signed)
Colon polyps x 2 removed today. Resume current medications. Call us with any questions or concerns. Thank you!!  YOU HAD AN ENDOSCOPIC PROCEDURE TODAY AT THE [facility]: Refer to the procedure report that was given to you for any specific questions about what was found during the examination.  If the procedure report does not answer your questions, please call your gastroenterologist to clarify.  If you requested that your care partner not be given the details of your procedure findings, then the procedure report has been included in a sealed envelope for you to review at your convenience later.  YOU SHOULD EXPECT: Some feelings of bloating in the abdomen. Passage of more gas than usual.  Walking can help get rid of the air that was put into your GI tract during the procedure and reduce the bloating. If you had a lower endoscopy (such as a colonoscopy or flexible sigmoidoscopy) you may notice spotting of blood in your stool or on the toilet paper. If you underwent a bowel prep for your procedure, then you may not have a normal bowel movement for a few days.  DIET: Your first meal following the procedure should be a light meal and then it is ok to progress to your normal diet.  A half-sandwich or bowl of soup is an example of a good first meal.  Heavy or fried foods are harder to digest and may make you feel nauseous or bloated.  Likewise meals heavy in dairy and vegetables can cause extra gas to form and this can also increase the bloating.  Drink plenty of fluids but you should avoid alcoholic beverages for 24 hours.  ACTIVITY: Your care partner should take you home directly after the procedure.  You should plan to take it easy, moving slowly for the rest of the day.  You can resume normal activity the day after the procedure however you should NOT DRIVE or use heavy machinery for 24 hours (because of the sedation medicines used during the test).    SYMPTOMS TO REPORT IMMEDIATELY: A  gastroenterologist can be reached at any hour.  During normal business hours, 8:30 AM to 5:00 PM Monday through Friday, call [phone].  After hours and on weekends, please call the GI answering service at [phone] who will take a message and have the physician on call contact you.   Following lower endoscopy (colonoscopy or flexible sigmoidoscopy):  Excessive amounts of blood in the stool  Significant tenderness or worsening of abdominal pains  Swelling of the abdomen that is new, acute  Fever of 100F or higher  Following upper endoscopy (EGD)  Vomiting of blood or coffee ground material  New chest pain or pain under the shoulder blades  Painful or persistently difficult swallowing  New shortness of breath  Fever of 100F or higher  Black, tarry-looking stools  FOLLOW UP: If any biopsies were taken you will be contacted by phone or by letter within the next 1-3 weeks.  Call your gastroenterologist if you have not heard about the biopsies in 3 weeks.  Our staff will call the home number listed on your records the next business day following your procedure to check on you and address any questions or concerns that you may have at that time regarding the information given to you following your procedure. This is a courtesy call and so if there is no answer at the home number and we have not heard from you through the emergency physician on call, we  will assume that you have returned to your regular daily activities without incident.  SIGNATURES/CONFIDENTIALITY: You and/or your care partner have signed paperwork which will be entered into your electronic medical record.  These signatures attest to the fact that that the information above on your After Visit Summary has been reviewed and is understood.  Full responsibility of the confidentiality of this discharge information lies with you and/or your care-partner.

## 2013-06-16 NOTE — Progress Notes (Signed)
Called to room to assist during endoscopic procedure.  Patient ID and intended procedure confirmed with present staff. Received instructions for my participation in the procedure from the performing physician.  

## 2013-06-16 NOTE — Op Note (Signed)
[facility] [address] [place], [address]   COLONOSCOPY PROCEDURE REPORT  PATIENT: Creg, Gilmer  MR#: [id number] BIRTHDATE: [birth year] , 52  yrs. old GENDER: Male ENDOSCOPIST: Beverley Fiedler, MD REFERRED BY: Charlsie Merles, MD PROCEDURE DATE:  06/16/2013 PROCEDURE:   Colonoscopy with snare polypectomy First Screening Colonoscopy - Avg.  risk and is 50 yrs.  old or older Yes.  Prior Negative Screening - Now for repeat screening. N/A  History of Adenoma - Now for follow-up colonoscopy & has been > or = to 3 yrs.  N/A  Polyps Removed Today? Yes. ASA CLASS:   Class III INDICATIONS:average risk screening and first colonoscopy. MEDICATIONS: MAC sedation, administered by CRNA and propofol (Diprivan) 300mg  IV  DESCRIPTION OF PROCEDURE:   After the risks benefits and alternatives of the procedure were thoroughly explained, informed consent was obtained.  A digital rectal exam revealed no rectal mass.   The [id number]  endoscope was introduced through the anus and advanced to the cecum, which was identified by both the appendix and ileocecal valve. No adverse events experienced. The quality of the prep was good, using MoviPrep  The instrument was then slowly withdrawn as the colon was fully examined.   COLON FINDINGS: Two sessile polyps measuring 3-6 mm in size were found in the transverse colon and sigmoid colon.  Polypectomy was performed using cold snare.  All resections were complete and all polyp tissue was completely retrieved.   The colon mucosa was otherwise normal.  Retroflexed views revealed internal hemorrhoids. The time to cecum=3 minutes 48 seconds.  Withdrawal time=15 minutes 53 seconds.  The scope was withdrawn and the procedure completed. COMPLICATIONS: There were no complications.  ENDOSCOPIC IMPRESSION: 1.   Two sessile polyps measuring 3-6 mm in size were found in the transverse colon and sigmoid colon; Polypectomy was performed  using cold snare 2.   The colon mucosa was otherwise normal  RECOMMENDATIONS: 1.  Await pathology results 2.  If the polyps removed today are proven to be adenomatous (pre-cancerous) polyps, you will need a repeat colonoscopy in 5 years.  Otherwise you should continue to follow colorectal cancer screening guidelines for "routine risk" patients with colonoscopy in 10 years.  You will receive a letter within 1-2 weeks with the results of your biopsy as well as final recommendations.  Please call my office if you have not received a letter after 3 weeks.   eSigned:  Beverley Fiedler, MD 06/16/2013 11:56 AM cc: The Patient; Charlsie Merles, MD

## 2013-06-16 NOTE — Progress Notes (Signed)
Patient did not experience any of the following events: a burn prior to discharge; a fall within the facility; wrong site/side/patient/procedure/implant event; or a hospital transfer or hospital admission upon discharge from the facility. (G8907) Patient did not have preoperative order for IV antibiotic SSI prophylaxis. (G8918)  

## 2013-06-17 ENCOUNTER — Telehealth: Payer: Self-pay | Admitting: *Deleted

## 2013-06-17 NOTE — Telephone Encounter (Signed)
  Follow up Call-  Call back number 06/16/2013  Post procedure Call Back phone  # [phone] hm  Permission to leave phone message Yes     Patient questions:  Do you have a fever, pain , or abdominal swelling? no Pain Score  0 *  Have you tolerated food without any problems? yes  Have you been able to return to your normal activities? yes  Do you have any questions about your discharge instructions: Diet   no Medications  no Follow up visit  no  Do you have questions or concerns about your Care? no  Actions: * If pain score is 4 or above: No action needed, pain <4.

## 2013-06-23 ENCOUNTER — Encounter: Payer: Self-pay | Admitting: Internal Medicine

## 2013-07-17 ENCOUNTER — Ambulatory Visit: Payer: Self-pay

## 2013-07-22 ENCOUNTER — Ambulatory Visit: Payer: Self-pay

## 2013-09-02 ENCOUNTER — Ambulatory Visit (INDEPENDENT_AMBULATORY_CARE_PROVIDER_SITE_OTHER): Payer: No Typology Code available for payment source | Admitting: *Deleted

## 2013-09-02 DIAGNOSIS — Z23 Encounter for immunization: Secondary | ICD-10-CM

## 2014-02-27 ENCOUNTER — Encounter: Payer: Self-pay | Admitting: Internal Medicine

## 2014-07-13 ENCOUNTER — Ambulatory Visit: Payer: Self-pay

## 2014-07-13 ENCOUNTER — Ambulatory Visit (INDEPENDENT_AMBULATORY_CARE_PROVIDER_SITE_OTHER): Payer: Self-pay | Admitting: *Deleted

## 2014-07-13 DIAGNOSIS — Z23 Encounter for immunization: Secondary | ICD-10-CM

## 2014-12-27 DIAGNOSIS — I469 Cardiac arrest, cause unspecified: Secondary | ICD-10-CM

## 2014-12-27 HISTORY — DX: Cardiac arrest, cause unspecified: I46.9

## 2015-01-12 ENCOUNTER — Encounter: Payer: Self-pay | Admitting: *Deleted

## 2015-01-21 ENCOUNTER — Other Ambulatory Visit: Payer: Self-pay

## 2015-01-21 ENCOUNTER — Other Ambulatory Visit (HOSPITAL_COMMUNITY): Payer: Self-pay

## 2015-01-21 ENCOUNTER — Inpatient Hospital Stay (HOSPITAL_COMMUNITY): Payer: Self-pay

## 2015-01-21 ENCOUNTER — Encounter (HOSPITAL_COMMUNITY): Payer: Self-pay | Admitting: *Deleted

## 2015-01-21 ENCOUNTER — Inpatient Hospital Stay (HOSPITAL_COMMUNITY)
Admission: EM | Admit: 2015-01-21 | Discharge: 2015-01-25 | DRG: 280 | Discharge status: Against Medical Advice | Payer: Self-pay | Attending: Internal Medicine | Admitting: Internal Medicine

## 2015-01-21 DIAGNOSIS — R4020 Unspecified coma: Secondary | ICD-10-CM | POA: Insufficient documentation

## 2015-01-21 DIAGNOSIS — J9601 Acute respiratory failure with hypoxia: Secondary | ICD-10-CM | POA: Insufficient documentation

## 2015-01-21 DIAGNOSIS — I429 Cardiomyopathy, unspecified: Secondary | ICD-10-CM | POA: Diagnosis present

## 2015-01-21 DIAGNOSIS — I4891 Unspecified atrial fibrillation: Secondary | ICD-10-CM | POA: Diagnosis present

## 2015-01-21 DIAGNOSIS — E872 Acidosis: Secondary | ICD-10-CM | POA: Diagnosis present

## 2015-01-21 DIAGNOSIS — I469 Cardiac arrest, cause unspecified: Secondary | ICD-10-CM

## 2015-01-21 DIAGNOSIS — I4901 Ventricular fibrillation: Secondary | ICD-10-CM | POA: Diagnosis present

## 2015-01-21 DIAGNOSIS — I872 Venous insufficiency (chronic) (peripheral): Secondary | ICD-10-CM | POA: Diagnosis present

## 2015-01-21 DIAGNOSIS — J690 Pneumonitis due to inhalation of food and vomit: Secondary | ICD-10-CM | POA: Insufficient documentation

## 2015-01-21 DIAGNOSIS — G9340 Encephalopathy, unspecified: Secondary | ICD-10-CM | POA: Diagnosis present

## 2015-01-21 DIAGNOSIS — R40243 Glasgow coma scale score 3-8, unspecified time: Secondary | ICD-10-CM

## 2015-01-21 DIAGNOSIS — F1410 Cocaine abuse, uncomplicated: Secondary | ICD-10-CM | POA: Diagnosis present

## 2015-01-21 DIAGNOSIS — J810 Acute pulmonary edema: Secondary | ICD-10-CM | POA: Diagnosis present

## 2015-01-21 DIAGNOSIS — I214 Non-ST elevation (NSTEMI) myocardial infarction: Principal | ICD-10-CM | POA: Diagnosis present

## 2015-01-21 DIAGNOSIS — D696 Thrombocytopenia, unspecified: Secondary | ICD-10-CM | POA: Diagnosis present

## 2015-01-21 DIAGNOSIS — Z7982 Long term (current) use of aspirin: Secondary | ICD-10-CM

## 2015-01-21 DIAGNOSIS — R40244 Other coma, without documented Glasgow coma scale score, or with partial score reported: Secondary | ICD-10-CM

## 2015-01-21 DIAGNOSIS — F1010 Alcohol abuse, uncomplicated: Secondary | ICD-10-CM | POA: Diagnosis present

## 2015-01-21 DIAGNOSIS — Z951 Presence of aortocoronary bypass graft: Secondary | ICD-10-CM

## 2015-01-21 DIAGNOSIS — F17210 Nicotine dependence, cigarettes, uncomplicated: Secondary | ICD-10-CM | POA: Diagnosis present

## 2015-01-21 DIAGNOSIS — Z452 Encounter for adjustment and management of vascular access device: Secondary | ICD-10-CM

## 2015-01-21 LAB — I-STAT ARTERIAL BLOOD GAS, ED
Acid-base deficit: 15 mmol/L — ABNORMAL HIGH (ref 0.0–2.0)
BICARBONATE: 15.1 meq/L — AB (ref 20.0–24.0)
O2 Saturation: 100 %
Patient temperature: 34
TCO2: 17 mmol/L (ref 0–100)
pCO2 arterial: 43.1 mmHg (ref 35.0–45.0)
pH, Arterial: 7.132 — CL (ref 7.350–7.450)
pO2, Arterial: 324 mmHg — ABNORMAL HIGH (ref 80.0–100.0)

## 2015-01-21 LAB — I-STAT CG4 LACTIC ACID, ED: Lactic Acid, Venous: 10.65 mmol/L (ref 0.5–2.0)

## 2015-01-21 LAB — BASIC METABOLIC PANEL
Anion gap: 18 — ABNORMAL HIGH (ref 5–15)
Anion gap: 11 (ref 5–15)
Anion gap: 13 (ref 5–15)
BUN: 15 mg/dL (ref 6–20)
BUN: 15 mg/dL (ref 6–20)
BUN: 17 mg/dL (ref 6–20)
CHLORIDE: 108 mmol/L (ref 101–111)
CHLORIDE: 112 mmol/L — AB (ref 101–111)
CO2: 15 mmol/L — AB (ref 22–32)
CO2: 16 mmol/L — AB (ref 22–32)
CO2: 13 mmol/L — AB (ref 22–32)
CREATININE: 1.12 mg/dL (ref 0.61–1.24)
Calcium: 7.9 mg/dL — ABNORMAL LOW (ref 8.9–10.3)
Calcium: 7.5 mg/dL — ABNORMAL LOW (ref 8.9–10.3)
Calcium: 8 mg/dL — ABNORMAL LOW (ref 8.9–10.3)
Chloride: 102 mmol/L (ref 101–111)
Creatinine, Ser: 1.48 mg/dL — ABNORMAL HIGH (ref 0.61–1.24)
Creatinine, Ser: 0.89 mg/dL (ref 0.61–1.24)
GFR calc Af Amer: >60 mL/min (ref >60)
GFR calc Af Amer: >60 mL/min (ref >60)
GFR calc non Af Amer: >60 mL/min (ref >60)
GFR, EST NON AFRICAN AMERICAN: 53 mL/min — AB (ref >60)
GLUCOSE: 384 mg/dL — AB (ref 65–99)
Glucose, Bld: 436 mg/dL — ABNORMAL HIGH (ref 65–99)
Glucose, Bld: 125 mg/dL — ABNORMAL HIGH (ref 65–99)
POTASSIUM: 3.3 mmol/L — AB (ref 3.5–5.1)
Potassium: 4.6 mmol/L (ref 3.5–5.1)
Potassium: 3.9 mmol/L (ref 3.5–5.1)
SODIUM: 138 mmol/L (ref 135–145)
Sodium: 135 mmol/L (ref 135–145)
Sodium: 135 mmol/L (ref 135–145)

## 2015-01-21 LAB — POCT I-STAT, CHEM 8
BUN: 16 mg/dL (ref 6–20)
BUN: 17 mg/dL (ref 6–20)
BUN: 18 mg/dL (ref 6–20)
BUN: 18 mg/dL (ref 6–20)
CALCIUM ION: 1.1 mmol/L — AB (ref 1.12–1.23)
CALCIUM ION: 1.08 mmol/L — AB (ref 1.12–1.23)
CHLORIDE: 107 mmol/L (ref 101–111)
CHLORIDE: 108 mmol/L (ref 101–111)
CHLORIDE: 111 mmol/L (ref 101–111)
Calcium, Ion: 1.02 mmol/L — ABNORMAL LOW (ref 1.12–1.23)
Calcium, Ion: 1.04 mmol/L — ABNORMAL LOW (ref 1.12–1.23)
Chloride: 112 mmol/L — ABNORMAL HIGH (ref 101–111)
Creatinine, Ser: 1 mg/dL (ref 0.61–1.24)
Creatinine, Ser: 0.9 mg/dL (ref 0.61–1.24)
Creatinine, Ser: 0.9 mg/dL (ref 0.61–1.24)
Creatinine, Ser: 0.7 mg/dL (ref 0.61–1.24)
GLUCOSE: 359 mg/dL — AB (ref 65–99)
GLUCOSE: 264 mg/dL — AB (ref 65–99)
Glucose, Bld: 393 mg/dL — ABNORMAL HIGH (ref 65–99)
Glucose, Bld: 181 mg/dL — ABNORMAL HIGH (ref 65–99)
HCT: 54 % — ABNORMAL HIGH (ref 39.0–52.0)
HCT: 55 % — ABNORMAL HIGH (ref 39.0–52.0)
HCT: 55 % — ABNORMAL HIGH (ref 39.0–52.0)
HCT: 53 % — ABNORMAL HIGH (ref 39.0–52.0)
HEMOGLOBIN: 18.4 g/dL — AB (ref 13.0–17.0)
HEMOGLOBIN: 18.7 g/dL — AB (ref 13.0–17.0)
HEMOGLOBIN: 18 g/dL — AB (ref 13.0–17.0)
Hemoglobin: 18.7 g/dL — ABNORMAL HIGH (ref 13.0–17.0)
POTASSIUM: 3 mmol/L — AB (ref 3.5–5.1)
Potassium: 3.1 mmol/L — ABNORMAL LOW (ref 3.5–5.1)
Potassium: 3.5 mmol/L (ref 3.5–5.1)
Potassium: 3.6 mmol/L (ref 3.5–5.1)
SODIUM: 139 mmol/L (ref 135–145)
SODIUM: 141 mmol/L (ref 135–145)
SODIUM: 143 mmol/L (ref 135–145)
Sodium: 139 mmol/L (ref 135–145)
TCO2: 12 mmol/L (ref 0–100)
TCO2: 13 mmol/L (ref 0–100)
TCO2: 12 mmol/L (ref 0–100)
TCO2: 11 mmol/L (ref 0–100)

## 2015-01-21 LAB — RAPID URINE DRUG SCREEN, HOSP PERFORMED
AMPHETAMINES: NOT DETECTED
BARBITURATES: NOT DETECTED
BENZODIAZEPINES: POSITIVE — AB
COCAINE: NOT DETECTED
OPIATES: NOT DETECTED
TETRAHYDROCANNABINOL: POSITIVE — AB

## 2015-01-21 LAB — TROPONIN I
TROPONIN I: 0.53 ng/mL — AB (ref <0.031)
Troponin I: 4.71 ng/mL (ref <0.031)
Troponin I: 6.24 ng/mL (ref <0.031)

## 2015-01-21 LAB — GLUCOSE, CAPILLARY
GLUCOSE-CAPILLARY: 319 mg/dL — AB (ref 65–99)
GLUCOSE-CAPILLARY: 270 mg/dL — AB (ref 65–99)
GLUCOSE-CAPILLARY: 194 mg/dL — AB (ref 65–99)
Glucose-Capillary: 288 mg/dL — ABNORMAL HIGH (ref 65–99)
Glucose-Capillary: 250 mg/dL — ABNORMAL HIGH (ref 65–99)
Glucose-Capillary: 212 mg/dL — ABNORMAL HIGH (ref 65–99)

## 2015-01-21 LAB — CBC
HCT: 47.3 % (ref 39.0–52.0)
HEMOGLOBIN: 15.8 g/dL (ref 13.0–17.0)
MCH: 28.8 pg (ref 26.0–34.0)
MCHC: 33.4 g/dL (ref 30.0–36.0)
MCV: 86.3 fL (ref 78.0–100.0)
Platelets: 177 10*3/uL (ref 150–400)
RBC: 5.48 MIL/uL (ref 4.22–5.81)
RDW: 13.9 % (ref 11.5–15.5)
WBC: 26.7 10*3/uL — ABNORMAL HIGH (ref 4.0–10.5)

## 2015-01-21 LAB — PROTIME-INR
INR: 1.4 (ref 0.00–1.49)
INR: 1.31 (ref 0.00–1.49)
Prothrombin Time: 17.3 seconds — ABNORMAL HIGH (ref 11.6–15.2)
Prothrombin Time: 16.4 seconds — ABNORMAL HIGH (ref 11.6–15.2)

## 2015-01-21 LAB — PROCALCITONIN: Procalcitonin: <0.1 ng/mL

## 2015-01-21 LAB — I-STAT TROPONIN, ED: Troponin i, poc: 0.79 ng/mL (ref 0.00–0.08)

## 2015-01-21 LAB — APTT
APTT: 50 s — AB (ref 24–37)
aPTT: 101 seconds — ABNORMAL HIGH (ref 24–37)

## 2015-01-21 LAB — HEPARIN LEVEL (UNFRACTIONATED): Heparin Unfractionated: 0.24 IU/mL — ABNORMAL LOW (ref 0.30–0.70)

## 2015-01-21 LAB — MRSA PCR SCREENING: MRSA by PCR: NEGATIVE

## 2015-01-21 MED ORDER — ASPIRIN 300 MG RE SUPP
300.0000 mg | RECTAL | Status: AC
  Administered 2015-01-21: 300 mg via RECTAL

## 2015-01-21 MED ORDER — "THROMBI-PAD 3""X3"" EX PADS"
1.0000 | MEDICATED_PAD | Freq: Once | CUTANEOUS | Status: AC
  Administered 2015-01-21: 1 via TOPICAL
  Filled 2015-01-21: qty 1

## 2015-01-21 MED ORDER — SODIUM CHLORIDE 0.9 % IV SOLN
1.0000 ug/kg/min | INTRAVENOUS | Status: DC
  Administered 2015-01-21: 1 ug/kg/min via INTRAVENOUS
  Filled 2015-01-21 (×2): qty 20

## 2015-01-21 MED ORDER — POTASSIUM CHLORIDE 10 MEQ/50ML IV SOLN
10.0000 meq | INTRAVENOUS | Status: AC
  Administered 2015-01-21 (×2): 10 meq via INTRAVENOUS
  Filled 2015-01-21: qty 50

## 2015-01-21 MED ORDER — NOREPINEPHRINE BITARTRATE 1 MG/ML IV SOLN
0.0000 ug/min | INTRAVENOUS | Status: DC
  Filled 2015-01-21: qty 4

## 2015-01-21 MED ORDER — DOPAMINE-DEXTROSE 1.6-5 MG/ML-% IV SOLN: 2.0000 ug/kg/min | Freq: Once | INTRAVENOUS | Status: DC

## 2015-01-21 MED ORDER — LIDOCAINE HCL (CARDIAC) 20 MG/ML IV SOLN
INTRAVENOUS | Status: AC
  Filled 2015-01-21: qty 5

## 2015-01-21 MED ORDER — AMIODARONE HCL IN DEXTROSE 360-4.14 MG/200ML-% IV SOLN
60.0000 mg/h | Freq: Once | INTRAVENOUS | Status: DC
  Filled 2015-01-21: qty 200

## 2015-01-21 MED ORDER — FENTANYL BOLUS VIA INFUSION
50.0000 ug | INTRAVENOUS | Status: DC | PRN
  Filled 2015-01-21: qty 50

## 2015-01-21 MED ORDER — CISATRACURIUM BOLUS VIA INFUSION
0.0500 mg/kg | INTRAVENOUS | Status: DC | PRN
  Filled 2015-01-21: qty 4

## 2015-01-21 MED ORDER — SODIUM CHLORIDE 0.9 % IV SOLN
INTRAVENOUS | Status: DC
  Administered 2015-01-21: 4.6 [IU]/h via INTRAVENOUS
  Administered 2015-01-21: 2.3 [IU]/h via INTRAVENOUS
  Filled 2015-01-21: qty 2.5

## 2015-01-21 MED ORDER — FENTANYL CITRATE (PF) 100 MCG/2ML IJ SOLN: 50.0000 ug | INTRAMUSCULAR | Status: DC | PRN

## 2015-01-21 MED ORDER — INSULIN GLARGINE 100 UNIT/ML ~~LOC~~ SOLN
10.0000 [IU] | Freq: Every day | SUBCUTANEOUS | Status: DC
  Administered 2015-01-21: 10 [IU] via SUBCUTANEOUS
  Filled 2015-01-21 (×3): qty 0.1

## 2015-01-21 MED ORDER — SODIUM CHLORIDE 0.9 % IV SOLN: 2000.0000 mL | Freq: Once | INTRAVENOUS | Status: DC

## 2015-01-21 MED ORDER — MIDAZOLAM HCL 2 MG/2ML IJ SOLN: 1.0000 mg | INTRAMUSCULAR | Status: DC | PRN

## 2015-01-21 MED ORDER — PANTOPRAZOLE SODIUM 40 MG IV SOLR
40.0000 mg | Freq: Every day | INTRAVENOUS | Status: DC
  Administered 2015-01-21 – 2015-01-23 (×3): 40 mg via INTRAVENOUS
  Filled 2015-01-21 (×4): qty 40

## 2015-01-21 MED ORDER — DEXTROSE 10 % IV SOLN: INTRAVENOUS | Status: DC | PRN

## 2015-01-21 MED ORDER — SUCCINYLCHOLINE CHLORIDE 20 MG/ML IJ SOLN
INTRAMUSCULAR | Status: AC
  Filled 2015-01-21: qty 1

## 2015-01-21 MED ORDER — ROCURONIUM BROMIDE 50 MG/5ML IV SOLN
INTRAVENOUS | Status: AC
  Filled 2015-01-21: qty 2

## 2015-01-21 MED ORDER — ROCURONIUM BROMIDE 50 MG/5ML IV SOLN
1.0000 mg/kg | Freq: Once | INTRAVENOUS | Status: AC
  Filled 2015-01-21: qty 8

## 2015-01-21 MED ORDER — ETOMIDATE 2 MG/ML IV SOLN
20.0000 mg | Freq: Once | INTRAVENOUS | Status: AC
Start: 2015-01-21 — End: 2015-01-21

## 2015-01-21 MED ORDER — ARTIFICIAL TEARS OP OINT
1.0000 "application " | TOPICAL_OINTMENT | Freq: Three times a day (TID) | OPHTHALMIC | Status: DC
  Administered 2015-01-21 – 2015-01-22 (×4): 1 via OPHTHALMIC
  Filled 2015-01-21: qty 3.5

## 2015-01-21 MED ORDER — SODIUM CHLORIDE 0.9 % IV SOLN
25.0000 ug/h | INTRAVENOUS | Status: DC
  Administered 2015-01-21: 400 ug/h via INTRAVENOUS
  Administered 2015-01-21: 25 ug/h via INTRAVENOUS
  Administered 2015-01-21: 250 ug/h via INTRAVENOUS
  Administered 2015-01-22: 350 ug/h via INTRAVENOUS
  Administered 2015-01-22 (×2): 400 ug/h via INTRAVENOUS
  Filled 2015-01-21 (×6): qty 50

## 2015-01-21 MED ORDER — FENTANYL CITRATE (PF) 100 MCG/2ML IJ SOLN: 100.0000 ug | Freq: Once | INTRAMUSCULAR | Status: DC

## 2015-01-21 MED ORDER — SODIUM CHLORIDE 0.9 % IV SOLN
INTRAVENOUS | Status: DC
  Administered 2015-01-21: 12:00:00 via INTRAVENOUS

## 2015-01-21 MED ORDER — ETOMIDATE 2 MG/ML IV SOLN
INTRAVENOUS | Status: AC
  Filled 2015-01-21: qty 20

## 2015-01-21 MED ORDER — SODIUM CHLORIDE 0.9 % IV SOLN
3.0000 g | Freq: Once | INTRAVENOUS | Status: DC
  Filled 2015-01-21: qty 3

## 2015-01-21 MED ORDER — PNEUMOCOCCAL VAC POLYVALENT 25 MCG/0.5ML IJ INJ
0.5000 mL | INJECTION | INTRAMUSCULAR | Status: AC
  Administered 2015-01-22: 0.5 mL via INTRAMUSCULAR
  Filled 2015-01-21: qty 0.5

## 2015-01-21 MED ORDER — POTASSIUM CHLORIDE 10 MEQ/50ML IV SOLN
10.0000 meq | INTRAVENOUS | Status: AC
  Administered 2015-01-21 (×4): 10 meq via INTRAVENOUS
  Filled 2015-01-21: qty 50

## 2015-01-21 MED ORDER — INSULIN ASPART 100 UNIT/ML ~~LOC~~ SOLN
2.0000 [IU] | SUBCUTANEOUS | Status: DC
  Administered 2015-01-22: 4 [IU] via SUBCUTANEOUS
  Administered 2015-01-22 – 2015-01-23 (×4): 2 [IU] via SUBCUTANEOUS

## 2015-01-21 MED ORDER — ETOMIDATE 2 MG/ML IV SOLN
INTRAVENOUS | Status: DC | PRN
  Administered 2015-01-21: 20 mg via INTRAVENOUS

## 2015-01-21 MED ORDER — ASPIRIN 300 MG RE SUPP
300.0000 mg | RECTAL | Status: AC
  Filled 2015-01-21 (×2): qty 1

## 2015-01-21 MED ORDER — HEPARIN (PORCINE) IN NACL 100-0.45 UNIT/ML-% IJ SOLN
700.0000 [IU]/h | INTRAMUSCULAR | Status: DC
  Administered 2015-01-21: 650 [IU]/h via INTRAVENOUS
  Administered 2015-01-22: 700 [IU]/h via INTRAVENOUS
  Filled 2015-01-21 (×3): qty 250

## 2015-01-21 MED ORDER — HEPARIN BOLUS VIA INFUSION
2500.0000 [IU] | Freq: Once | INTRAVENOUS | Status: AC
  Administered 2015-01-21: 2500 [IU] via INTRAVENOUS
  Filled 2015-01-21: qty 2500

## 2015-01-21 MED ORDER — DEXTROSE IN LACTATED RINGERS 5 % IV SOLN: INTRAVENOUS | Status: DC

## 2015-01-21 MED ORDER — MIDAZOLAM HCL 5 MG/ML IJ SOLN
1.0000 mg/h | INTRAMUSCULAR | Status: DC
  Administered 2015-01-21: 5 mg/h via INTRAVENOUS
  Administered 2015-01-21: 1 mg/h via INTRAVENOUS
  Administered 2015-01-21: 5 mg/h via INTRAVENOUS
  Administered 2015-01-22: 8 mg/h via INTRAVENOUS
  Administered 2015-01-22: 7 mg/h via INTRAVENOUS
  Filled 2015-01-21 (×6): qty 10

## 2015-01-21 MED ORDER — CHLORHEXIDINE GLUCONATE 0.12 % MT SOLN
15.0000 mL | Freq: Two times a day (BID) | OROMUCOSAL | Status: DC
  Administered 2015-01-21 – 2015-01-23 (×5): 15 mL via OROMUCOSAL
  Filled 2015-01-21 (×5): qty 15

## 2015-01-21 MED ORDER — ROCURONIUM BROMIDE 50 MG/5ML IV SOLN
INTRAVENOUS | Status: DC | PRN
  Administered 2015-01-21: 80 mg via INTRAVENOUS

## 2015-01-21 MED ORDER — MIDAZOLAM BOLUS VIA INFUSION
2.0000 mg | INTRAVENOUS | Status: DC | PRN
  Filled 2015-01-21: qty 2

## 2015-01-21 MED ORDER — PROPOFOL 1000 MG/100ML IV EMUL
5.0000 ug/kg/min | INTRAVENOUS | Status: DC
  Filled 2015-01-21: qty 100

## 2015-01-21 MED ORDER — MIDAZOLAM HCL 2 MG/2ML IJ SOLN: 2.0000 mg | Freq: Once | INTRAMUSCULAR | Status: DC

## 2015-01-21 MED ORDER — SODIUM CHLORIDE 0.9 % IV SOLN: 2000.0000 mL | Freq: Once | INTRAVENOUS | Status: AC

## 2015-01-21 MED ORDER — CISATRACURIUM BOLUS VIA INFUSION
0.1000 mg/kg | Freq: Once | INTRAVENOUS | Status: DC
  Filled 2015-01-21: qty 8

## 2015-01-21 MED ORDER — HEPARIN BOLUS VIA INFUSION
4000.0000 [IU] | Freq: Once | INTRAVENOUS | Status: DC
  Filled 2015-01-21: qty 4000

## 2015-01-21 MED ORDER — LABETALOL HCL 5 MG/ML IV SOLN
20.0000 mg | INTRAVENOUS | Status: DC | PRN
Start: 2015-01-21 — End: 2015-01-25
  Administered 2015-01-22 – 2015-01-23 (×4): 20 mg via INTRAVENOUS
  Filled 2015-01-21 (×4): qty 4

## 2015-01-21 MED ORDER — CETYLPYRIDINIUM CHLORIDE 0.05 % MT LIQD
7.0000 mL | Freq: Four times a day (QID) | OROMUCOSAL | Status: DC
  Administered 2015-01-21 – 2015-01-23 (×8): 7 mL via OROMUCOSAL

## 2015-01-21 NOTE — ED Notes (Signed)
Paged the chaplain for the family.

## 2015-01-21 NOTE — Consult Note (Signed)
CARDIOLOGY CONSULT NOTE       Patient ID: [name] [name] MRN: [id number] DOB/AGE: 52/29/1962 52 y.o.  Admit date: 01/21/2015 Referring Physician:  Alva Garnet Primary Physician: No primary care provider on file. Primary Cardiologist:  None Reason for Consultation:  Cardiac Arrest  Active Problems:   Cardiac arrest   HPI:  54 y.o. previous drug user.  History from family as patient is intubated and sedated.  No previously documented CAD. Smoker.  Awoke this am and had right shoulder pain.  Had mowed 5 acres yesterday.  Took one oxycodone.  Family then noticed ? Seizure activity and he then went down.  No antecedent chest pain, dyspnea palpitations.  No previous history of syncope.  Family indicates no heavy drug use ( crack) for at least 5 years.  ECG in ER is normal no acute ST changes and normal QT interval  Had 10 rounds epi and multiple shocks with 30 mintue down down and king airway in field    Bedside echo done: Mild LVE EF 45-50% septal hypokinesis Normal RV No effusion Calcified AV with no AS or AR Sinus valsalva mildly dilated but root only 3.3 cm with shadowing artifact in rrot from aortic valve  Hemodynamics stable SR on vent with no arrhythmia on telemetry.  CT pending    ROS All other systems reviewed and negative except as noted above  No past medical history on file.  No family history on file.  History   Social History  . Marital Status: Married    Spouse Name: N/A  . Number of Children: N/A  . Years of Education: N/A   Occupational History  . Not on file.   Social History Main Topics  . Smoking status: Not on file  . Smokeless tobacco: Not on file  . Alcohol Use: Not on file  . Drug Use: Not on file  . Sexual Activity: Not on file   Other Topics Concern  . Not on file   Social History Narrative  . No narrative on file    No past surgical history on file.   Marland Kitchen etomidate      . lidocaine (cardiac) 100 mg/70ml      . rocuronium      .  succinylcholine      . artificial tears  1 application Both Eyes 3 times per day  . aspirin  300 mg Rectal NOW  . aspirin  300 mg Rectal NOW  . cisatracurium  0.1 mg/kg Intravenous Once  . fentaNYL (SUBLIMAZE) injection  100 mcg Intravenous Once  . heparin  2,500 Units Intravenous Once  . midazolam  2 mg Intravenous Once  . pantoprazole (PROTONIX) IV  40 mg Intravenous QHS   . sodium chloride    . cisatracurium (NIMBEX) infusion    . fentaNYL infusion INTRAVENOUS 25 mcg/hr (01/21/15 0919)  . heparin    . midazolam (VERSED) infusion 1 mg/hr (01/21/15 0918)  . norepinephrine (LEVOPHED) Adult infusion      Physical Exam: Blood pressure 113/81, pulse 80, temperature 93.2 F (34 C), resp. rate 14, weight 80 kg (176 lb 5.9 oz), SpO2 99 %.    Intubated and sedated Looks older than stated age  22: normal Neck supple with no adenopathy JVP normal no bruits no thyromegaly Lungs bilateral breath sounds  and good diaphragmatic motion Heart:  S1/S2 no murmur, no rub, gallop or click PMI normal Abdomen: benighn, BS positve, no tenderness, no AAA no bruit.  No HSM or HJR Distal pulses intact  with no bruits No edema Neuro sedated  Skin warm and dry Paralyzed on vent    Labs:   Lab Results  Component Value Date   WBC 26.7* 01/21/2015   HGB 15.8 01/21/2015   HCT 47.3 01/21/2015   MCV 86.3 01/21/2015   PLT 177 01/21/2015      Radiology: Dg Chest Portable 1 View  01/21/2015   CLINICAL DATA:  Hypoxia  EXAM: PORTABLE CHEST - 1 VIEW  COMPARISON:  April 01, 2013  FINDINGS: Endotracheal tube tip is 1.4 cm above the carina. Nasogastric tube tip and side port are below the diaphragm. No pneumothorax. There is airspace consolidation throughout the right upper lobe. Lungs elsewhere clear. Heart size and pulmonary vascularity are normal. No adenopathy. No focal bone lesions evident.  IMPRESSION: Tube positions as described without pneumothorax. Right upper lobe airspace consolidation.  Differential considerations include pneumonia, contusion/hemorrhage, or possible aspiration.   Electronically Signed   By: Lowella Grip III M.D.   On: 01/21/2015 09:02    EKG:  NsR normal no acute changes normal QT   ASSESSMENT AND PLAN:  Cardiac Arrest:  EMS indicated Vfib in field.  Prolonged down time  Troponins pending  No acute ECG changes no indication to go to lab at this time.  Cooling Protocol per CCM and heparin when CT head cleared.   Pulmonay:  On vent  ? Aspiration WBC up and RUL infiltrate  Antibiotics per CCM Drug Use:  Previous check UDS  Did take oxycodine this am for presumed muscular pain  Signed: Jenkins Rouge 01/21/2015, 9:30 AM

## 2015-01-21 NOTE — ED Notes (Signed)
Pt hr decreased to 48-58.  Pulses remain strong.  CC paged.

## 2015-01-21 NOTE — ED Notes (Addendum)
Amiodarone cancelled per cardiology - Dr Johnsie Cancel. ECHO at bedside. Hold heparin and asa until  Ct.

## 2015-01-21 NOTE — Progress Notes (Signed)
Patient transported from trauma room C to CT and to room 2H13 without any complications.

## 2015-01-21 NOTE — Procedures (Signed)
Arterial Catheter Insertion Procedure Note [name] [name] [id number] [birth year]  Procedure: Insertion of Arterial Catheter  Indications: Blood pressure monitoring  Procedure Details Consent: Risks of procedure as well as the alternatives and risks of each were explained to the (patient/caregiver).  Consent for procedure obtained. Time Out: Verified patient identification, verified procedure, site/side was marked, verified correct patient position, special equipment/implants available, medications/allergies/relevent history reviewed, required imaging and test results available.  Performed  Maximum sterile technique was used including antiseptics, cap, gloves, gown, hand hygiene, mask and sheet. Skin prep: Chlorhexidine; local anesthetic administered 20 gauge catheter was inserted into right radial artery using the Seldinger technique.  Evaluation Blood flow good; BP tracing good. Complications: No apparent complications.   [name] 01/21/2015

## 2015-01-21 NOTE — Progress Notes (Signed)
New ST elevation noted on bedside monitor. 12 Lead EKG obtained. Dr Johnsie Cancel at bedside to compare EKG and discuss labs. Will continue to monitor.

## 2015-01-21 NOTE — ED Notes (Signed)
Code cool paged by charge RN.

## 2015-01-21 NOTE — ED Notes (Signed)
Cardiology at bedside.

## 2015-01-21 NOTE — Progress Notes (Signed)
EEG completed at bedside, results pending. 

## 2015-01-21 NOTE — Progress Notes (Signed)
Continued support to family and escorted them to 2 Heart waiting area.. Patient being going to 2H13. Patient nurse is aware that family is in waiting.  Will pass on to unit chaplain for support as needed.

## 2015-01-21 NOTE — Progress Notes (Signed)
Chaplain Note:   Chaplain paged to visit with family.   [place] family very warm and conversational.   Pt's mother, spouse, daughter and other family members present, 6 total are in consult B.   Family speaks very highly of Mr. Rolph and note he is a very spiritual man who loves to sing. His spouse of 29 years also speaks fondly of their relationship as well as her daughter.   They mentioned that "He always talking about meeting Jesus in songs but we aren't quite ready to let him go"   Chaplain provided emotional and spiritual support and also brought them coffee. Family requested prayer and Chaplain prayed with family.   Family is in consult B, healthcare team aware of family location. Family updated of what's taking place and understand that it may be a long while before they are able to see hi,. They expressed that they understanding of what's happening and appreciate the updates.   Family supporting one another.   Delford Field, Chaplain 01/21/2015

## 2015-01-21 NOTE — ED Notes (Signed)
Lab results of I-Stat troponin was 0.79 reported to Dr.Walden.

## 2015-01-21 NOTE — ED Notes (Signed)
Pt monitored by Zoll pads, 12 lead, bp cuff, and pulse ox.

## 2015-01-21 NOTE — ED Notes (Signed)
Lab results of I-Stat CG4 was 10.65 given to Dr.Walden.

## 2015-01-21 NOTE — ED Notes (Signed)
Pt was speaking to his wife, c/o tingling to shoulder, began seizing. EMS arrived 7 minutes later and pt was in v-fib.  Shocked x 6.  Given 10 epi, 450 mg amiodarone, 5 versed (for combativeness per ems) and 100 of fentanyl.  CPR was performed for 45 minutes.  Pt regained pulses on-and-off (usually afib).  Pt with what appears to be decorticate posturing on arrival.  PERRL.  ST.

## 2015-01-21 NOTE — Procedures (Addendum)
History: 54 yo M s/p cardiac arrest  Sedation: midazolam, fentanyl  Technique: This is a 19 channel routine scalp EEG performed at the bedside with bipolar and monopolar montages arranged in accordance to the international 10/20 system of electrode placement. One channel was dedicated to EKG recording.    Background: The background consists of intermixed alpha and beta activities with some low voltage delta activity. At times there are sleep structures seen with normal distribution.   Photic stimulation: Physiologic driving is not performed  EEG Abnormalities: 1) Irregular delta activity  Clinical Interpretation: This EEG is essentially a normal drowsy and sleep EEG though cannot rule out encephalopathy with this study. There was no seizure or seizure predisposition recorded on this study.   Roland Rack, MD [facility] [phone]  If 7pm- 7am, please page neurology on call as listed in [place].

## 2015-01-21 NOTE — ED Notes (Signed)
CCM at bedside.  Family at bedside.

## 2015-01-21 NOTE — H&P (Signed)
PULMONARY / CRITICAL CARE MEDICINE   Name: [name] [name] MRN: [id number] DOB: [birth year]    ADMISSION DATE:  01/21/2015 CONSULTATION DATE:  5/26  REFERRING MD :  Mingo Amber  CHIEF COMPLAINT:  Post arrest   INITIAL PRESENTATION:  54 year old male admitted to [facility] 5/26 s/p VF arrest. ROSC estimated at > 45 minutes.   STUDIES:  ECHO 5/26: prelim EF ~40%-45% CT head 5/26>>> SIGNIFICANT EVENTS: 4/26: initiated cooling    HISTORY OF PRESENT ILLNESS:   54 year old male w/ remote h/o cocaine abuse. Per family had been doing heavy outdoor work the day prior. Then the am on 5/26 reported cc: left shoulder pain which he initially felt was d/t heavy work the day prior. Family then reports they walked back into room with him on floor w/ seizure like activity. EMS was called. No bystander CPR performed. On arrival rhythm was VF. ROSC estimated at 45 minutes w/ transient pulse return during this time. On arrival to ER he was over-breathing vent, had decorticate posturing. PCCM called to admit   PAST MEDICAL HISTORY :   has no past medical history on file.  has no past surgical history on file. Prior to Admission medications   Not on File   Allergies not on file  FAMILY HISTORY:  has no family status information on file.  SOCIAL HISTORY:    REVIEW OF SYSTEMS:  unable  VITAL SIGNS: Temp:  [93.2 F (34 C)] 93.2 F (34 C) (05/26 0850) Pulse Rate:  [80-95] 80 (05/26 0850) Resp:  [14-30] 14 (05/26 0850) BP: (109-136)/(78-96) 113/81 mmHg (05/26 0845) SpO2:  [93 %-99 %] 99 % (05/26 0850) FiO2 (%):  [50 %-100 %] 50 % (05/26 0913) Weight:  [80 kg (176 lb 5.9 oz)] 80 kg (176 lb 5.9 oz) (05/26 0831) HEMODYNAMICS:   VENTILATOR SETTINGS: Vent Mode:  [-] PRVC FiO2 (%):  [50 %-100 %] 50 % Set Rate:  [20 bmp-24 bmp] 24 bmp Vt Set:  [570 mL] 570 mL PEEP:  [5 cmH20] 5 cmH20 Plateau Pressure:  [17 cmH20] 17 cmH20 INTAKE / OUTPUT:  Intake/Output Summary (Last 24 hours) at 01/21/15 9518 Last data  filed at 01/21/15 8416  Gross per 24 hour  Intake      0 ml  Output    350 ml  Net   -350 ml    PHYSICAL EXAMINATION: General:  Well nourished 54 year old male, unresponsive on vent  Neuro:  PERRL, decorticate posturing prior to sedation  HEENT:  [place], no JVD, orally intubated  Cardiovascular:  rrr Lungs:  Diffuse rhonchi  Abdomen:  Soft, non-tender + bowel sounds  Musculoskeletal:  Intact  Skin:  Intact   LABS:  CBC  Recent Labs Lab 01/21/15 0830  WBC 26.7*  HGB 15.8  HCT 47.3  PLT 177   Coag's  Recent Labs Lab 01/21/15 0830  APTT 50*  INR 1.40   BMET No results for input(s): NA, K, CL, CO2, BUN, CREATININE, GLUCOSE in the last 168 hours. Electrolytes No results for input(s): CALCIUM, MG, PHOS in the last 168 hours. Sepsis Markers  Recent Labs Lab 01/21/15 0839  LATICACIDVEN 10.65*   ABG  Recent Labs Lab 01/21/15 0853  PHART 7.132*  PCO2ART 43.1  PO2ART 324.0*   Liver Enzymes No results for input(s): AST, ALT, ALKPHOS, BILITOT, ALBUMIN in the last 168 hours. Cardiac Enzymes No results for input(s): TROPONINI, PROBNP in the last 168 hours. Glucose No results for input(s): GLUCAP in the last 168 hours.  Imaging Dg Chest Portable 1 View  01/21/2015   CLINICAL DATA:  Hypoxia  EXAM: PORTABLE CHEST - 1 VIEW  COMPARISON:  April 01, 2013  FINDINGS: Endotracheal tube tip is 1.4 cm above the carina. Nasogastric tube tip and side port are below the diaphragm. No pneumothorax. There is airspace consolidation throughout the right upper lobe. Lungs elsewhere clear. Heart size and pulmonary vascularity are normal. No adenopathy. No focal bone lesions evident.  IMPRESSION: Tube positions as described without pneumothorax. Right upper lobe airspace consolidation. Differential considerations include pneumonia, contusion/hemorrhage, or possible aspiration.   Electronically Signed   By: Lowella Grip III M.D.   On: 01/21/2015 09:02     ASSESSMENT /  PLAN:  PULMONARY OETT 5/26>>> A: Acute cardiopulmonary arrest Pulmonary edema vs ALI d/t fractured capillary syndrome  R/o aspiration  P:   Full vent support F/u abg; Ve adjusted See neuro section re: NMB and sedation  See ID section   CARDIOVASCULAR CVL right IJ CVL 5/26>>> A:  VF arrest Abnormal trop but nml ECG: ? Primary VF vs non-cardiac etiology  P:  Hypothermia protocol F/u formal ECHO Heparin gtt and asa after CT head clear Further recs per cards. Will prob need EP eval if has cognitive recovery   RENAL A:   Lactic acidosis s/p cardiac arrest P:   COnt IVFs Strict I&O F/u labs   GASTROINTESTINAL A:   No acute  P:   NPO Start tube feeds 5/26 PPI for SUP  HEMATOLOGIC A:   No acute  P:  Trend CBC Transfuse per ICU protocol  Heparin gtt to begin assuming CT head negative   INFECTIOUS A:   R/o aspiration  P:    Sputum 5/26>>> Ck PCT, if elevated start unasyn>>>   ENDOCRINE A:   No acute   P:   Trend glucose   NEUROLOGIC A:   Acute encephalopathy/COMA  s/p cardiac arrest. Worrisome for hypoxic injury given prolonged down time.  Seizure type activity (not witnessed by healthcare workers). Likely d/t VF and decreased cerebral perfusion  P:   Hypothermia protocol  RAS not applicable given NMB gtt Will be RAS goal -2 after warming begins Nimbex/fentanyl and versed per protocol CT head and EEG still pending.    FAMILY  - Updates: 5/26   - Inter-disciplinary family meet or Palliative Care meeting due by: 6/2    TODAY'S SUMMARY: VF arrest/ unclear if primary VF or other etiology. ECG and trop as well as prelim echo don't really support primary cardiac event. Had prolonged down time. Will start hypothermia protocol. PCT pending if elevated will need unasyn, will hold for now. Otherwise cont supportive care and f/u neuro-diagnostics.    Erick Colace ACNP-BC [place] Pager # [phone] OR # [phone] if no  answer   01/21/2015, 9:26 AM   PCCM ATTENDING: I have reviewed pt's initial presentation, consultants notes and hospital database in detail.  The above assessment and plan was formulated under my direction.  In summary: 19 M smoker with remote cocaine use but minimal PMH who suffered out of hospital cardiac arrest requiring prolonged CPR. Hypothermia protocol implemented in ED. PCCM has admitted pt. Cards has seen in consultation. EKG is not c/w STEMI. Echo shows depressed LVEF but no SWMAs. CT head reveals abnormalities in B MCAs and he has had brief runs of AF raising concern for an acute embolic shower affecting both hemispheres. A CTA head is ordered  Hypothermia protocol Full vent support Vent bundle Daily  SBT if/when meets criteria R/O MI with serial markers Full dose heparin for now MAP goal > 80 mmHg per protocol Monitor BMET intermittently Monitor I/Os Correct electrolytes as indicated Follow up CXR 5/27 to ensure no infiltrate develops to suggest PNA PCT protocol CTA head to r/o B MCA obstruction  If present, there is will be no reasonable hope for meaningful recovery    40 minutes of independent CCM time was provided by me   Merton Border, MD;  PCCM service; Mobile [phone]

## 2015-01-21 NOTE — Care Management Note (Signed)
Case Management Note  Patient Details  Name: [name] [name] MRN: [id number] Date of Birth: [birth year]  Subjective/Objective:     Adm w arrest-vent             Action/Plan: lives w fam  Expected Discharge Date:                  Expected Discharge Plan:     In-House Referral:     Discharge planning Services  CM Consult  Post Acute Care Choice:    Choice offered to:     DME Arranged:    DME Agency:     HH Arranged:    [place]:     Status of Service:     Medicare Important Message Given:    Date Medicare IM Given:    Medicare IM give by:    Date Additional Medicare IM Given:    Additional Medicare Important Message give by:     If discussed at [place] of Stay Meetings, dates discussed:    Additional Comments: ur review done.  Lacretia Leigh, RN 01/21/2015, 11:25 AM

## 2015-01-21 NOTE — ED Notes (Signed)
Ice packs placed.

## 2015-01-21 NOTE — Progress Notes (Signed)
Patient ID: [name] [name], male   DOB: 1961-03-20, 54 y.o.   MRN: [id number] Asked by nursing to review ECG SB with changes from hypothermia.  QT very long and J point elevation Reviewed with Dr Burt Knack interventionalist on call as well who agreed no indication to take to lab especially with  Lactate over 10 Elvevated WBC And CT showing bilateral MCA hypoperfusion  Suggesting poor functional recovery after prolonged down time Will see what ECG looks like when wamred  He is at risk for tordades with bradycardia and QT over 600 check Mg and K  Jenkins Rouge

## 2015-01-21 NOTE — ED Notes (Signed)
Top dentures with pt belongings.

## 2015-01-21 NOTE — ED Notes (Signed)
Temp Foley placed by [facility] EMT. Sterile technique observed by Georga Hacking. Pericare completed. Urine return of 122ml. Tubing secured to pt's left thigh with strap. Bag labeled with time, date, location, and initials.

## 2015-01-21 NOTE — ED Notes (Addendum)
X-ray called.  Waiting to verify central line before taking pt to CT.  Per charge do no place cooling pads d/t report called and pt soon to go to CT.

## 2015-01-21 NOTE — ED Provider Notes (Signed)
CSN: [id number]     Arrival date & time 01/21/15  0822 History   First MD Initiated Contact with Patient 01/21/15 0825     Chief Complaint  Patient presents with  . [name] [name] cpr      (Consider location/radiation/quality/duration/timing/severity/associated sxs/prior Treatment) HPI Comments: Level V exception to history secondary to acuity  Patient is a 54 year old male brought via EMS for cardiac arrest. Patient's family reports he woke up this morning complaining of some left shoulder and chest pain which she associated with muscle aches and pains. Wife got him some coffee. When returning to room she noted he was having some jerking and was unresponsive. EMS was called. On their arrival patient was pulseless and apneic. King airway was placed CPR was started. CPR for approximately 30 minutes. Initial rhythm ventricular fibrillation. Status [name] [name] multiple defibrillations and 10 rounds of epinephrine. Did obtain ROSC with EMS   Past Medical History  Diagnosis Date  . Medical history non-contributory    Past Surgical History  Procedure Laterality Date  . No past surgeries     History reviewed. No pertinent family history. History  Substance Use Topics  . Smoking status: Current Every Day Smoker -- 1.00 packs/day for 35 years    Types: Cigarettes  . Smokeless tobacco: Not on file  . Alcohol Use: Not on file    Review of Systems  Unable to perform ROS: Acuity of condition      Allergies  Review of patient's allergies indicates no known allergies.  Home Medications   Prior to Admission medications   Medication Sig Start Date End Date Taking? Authorizing Provider  aspirin EC 81 MG tablet Take 81 mg by mouth daily.   Yes Historical Provider, MD  Multiple Vitamins-Minerals (MULTIVITAMIN ADULTS 50+) TABS Take 1 tablet by mouth daily.   Yes Historical Provider, MD   BP 151/112 mmHg  Pulse 49  Temp(Src) 93 F (33.9 C) (Core (Comment))  Resp 24  Ht 5\' 9"  (1.753 m)  Wt 149 lb 4 oz  (67.7 kg)  BMI 22.03 kg/m2  SpO2 100% Physical Exam  Constitutional: He appears distressed.  HENT:  Head: Normocephalic.  Eyes:  66mm sluggish   Cardiovascular: Intact distal pulses.   HR 80's  Pulmonary/Chest: No stridor.  King in place with bilateral BS sats 90's  Abdominal: Soft. He exhibits no distension.  Musculoskeletal:  No gross deformity    Neurological:  GCS 3t  Skin: Skin is warm.  Psychiatric:  Unable to assess   Vitals reviewed.   ED Course  INTUBATION Date/Time: 01/21/2015 2:26 PM Performed by: [name] [name] Authorized by: [name] [name] Consent: The procedure was performed in an emergent situation. Indications: respiratory distress and  respiratory failure Sedatives: etomidate Paralytic: rocuronium Laryngoscope size: Mac 4 Tube size: 8.0 mm Tube type: cuffed Number of attempts: 1 Cords visualized: yes [name] [name]-procedure assessment: chest rise and ETCO2 monitor ETT to lip: 25 cm Tube secured with: ETT holder Chest x-ray interpreted by me. Chest x-ray findings: endotracheal tube in appropriate position Patient tolerance: Patient tolerated the procedure well with no immediate complications   (including critical care time) Labs Review Labs Reviewed  CBC - Abnormal; Notable for the following:    WBC 26.7 (*)    All other components within normal limits  BASIC METABOLIC PANEL - Abnormal; Notable for the following:    CO2 15 (*)    Glucose, Bld 436 (*)    Creatinine, Ser 1.48 (*)    Calcium 7.9 (*)  GFR calc non Af Amer 53 (*)    Anion gap 18 (*)    All other components within normal limits  TROPONIN I - Abnormal; Notable for the following:    Troponin I 0.53 (*)    All other components within normal limits  PROTIME-INR - Abnormal; Notable for the following:    Prothrombin Time 17.3 (*)    All other components within normal limits  APTT - Abnormal; Notable for the following:    aPTT 50 (*)    All other components within normal limits  BASIC  METABOLIC PANEL - Abnormal; Notable for the following:    Potassium 3.3 (*)    CO2 16 (*)    Glucose, Bld 384 (*)    Calcium 7.5 (*)    All other components within normal limits  URINE RAPID DRUG SCREEN (HOSP PERFORMED) NOT AT [facility] - Abnormal; Notable for the following:    Benzodiazepines POSITIVE (*)    Tetrahydrocannabinol POSITIVE (*)    All other components within normal limits  I-STAT CG4 LACTIC ACID, ED - Abnormal; Notable for the following:    Lactic Acid, Venous 10.65 (*)    All other components within normal limits  I-STAT TROPOININ, ED - Abnormal; Notable for the following:    Troponin i, poc 0.79 (*)    All other components within normal limits  I-STAT ARTERIAL BLOOD GAS, ED - Abnormal; Notable for the following:    pH, Arterial 7.132 (*)    pO2, Arterial 324.0 (*)    Bicarbonate 15.1 (*)    Acid-base deficit 15.0 (*)    All other components within normal limits  MRSA PCR SCREENING  CULTURE, RESPIRATORY (NON-EXPECTORATED)  PROCALCITONIN  BLOOD GAS, ARTERIAL  TROPONIN I  TROPONIN I  PROTIME-INR  APTT  HEPARIN LEVEL (UNFRACTIONATED)    Imaging Review Ct Head Wo Contrast  01/21/2015   ADDENDUM REPORT: 01/21/2015 11:20  ADDENDUM: These results were called by telephone at the time of interpretation on 01/21/2015 at 11:19 am to Dr. Gwendolyn [name], who verbally acknowledged these results.   Electronically Signed   By: Lowella Grip III M.D.   On: 01/21/2015 11:20   01/21/2015   CLINICAL DATA:  Status [name] [name] cardiac arrest  EXAM: CT HEAD WITHOUT CONTRAST  TECHNIQUE: Contiguous axial images were obtained from the base of the skull through the vertex without intravenous contrast.  COMPARISON:  None.  FINDINGS: The ventricles are normal in size and configuration. There is no intracranial mass, hemorrhage, extra-axial fluid collection, or midline shift. No focal gray-white compartment lesions are identified.  Both middle cerebral arteries show symmetric increased attenuation. Bony  calvarium appears intact. The mastoid air cells are clear.  IMPRESSION: Increased attenuation is noted in both middle cerebral arteries. The significance of this symmetric finding is uncertain. Given the history, the possibility of bilateral middle cerebral artery thrombosis must be of concern. This finding may well warrant CT or MR angiography to further assess. There is no intracranial edema. Gray-white compartments appear normal. No acute hemorrhage evident.  Message left for Dr. Gwendolyn [name] with respect to this report, Jan 21, 2015, 10:56 a.m.  Electronically Signed: By: Lowella Grip III M.D. On: 01/21/2015 10:56   Dg Chest Port 1 View  01/21/2015   CLINICAL DATA:  Status Memorie Yokoyama central line placement  EXAM: PORTABLE CHEST - 1 VIEW  COMPARISON:  01/21/2015  FINDINGS: Cardiac shadow is stable. An endotracheal tube, nasogastric catheter and right jugular central line are noted in satisfactory position. No  pneumothorax is identified. Persistent infiltrative changes are noted within both lungs. No new focal abnormality is seen.  IMPRESSION: Status Onur Mori right central line placement without evidence of pneumothorax.  The remainder of the exam is stable.   Electronically Signed   By: Inez Catalina M.D.   On: 01/21/2015 10:21   Dg Chest Portable 1 View  01/21/2015   CLINICAL DATA:  Hypoxia  EXAM: PORTABLE CHEST - 1 VIEW  COMPARISON:  April 01, 2013  FINDINGS: Endotracheal tube tip is 1.4 cm above the carina. Nasogastric tube tip and side port are below the diaphragm. No pneumothorax. There is airspace consolidation throughout the right upper lobe. Lungs elsewhere clear. Heart size and pulmonary vascularity are normal. No adenopathy. No focal bone lesions evident.  IMPRESSION: Tube positions as described without pneumothorax. Right upper lobe airspace consolidation. Differential considerations include pneumonia, contusion/hemorrhage, or possible aspiration.   Electronically Signed   By: Lowella Grip III  M.D.   On: 01/21/2015 09:02     EKG Interpretation None      MDM  54 year old male status Connery Shiffler cardiac arrest. On arrival has intact pulses. He is moving all extremities. Was complaining of left shoulder and chest pain prior to arrest. Suspect cardiac event. Patient's King airway was replaced with an ET tube for definitive airway management. IV resuscitation and cooling begun. Initial labs significant for lactic acidosis elevated troponin. Head CT and remainder of labs as noted above. Critical care bedside. Patient to be admitted to the ICU.   Final diagnoses:  Coma  Encounter for central line placement  Cardiac arrest        Robynn Pane, MD 01/21/15 [place], MD 01/21/15 [phone]

## 2015-01-21 NOTE — Progress Notes (Addendum)
ANTICOAGULATION CONSULT NOTE - Follow Up Consult  Pharmacy Consult for heparin Indication: chest pain/ACS  No Known Allergies  Patient Measurements: Height: 5\' 9"  (175.3 cm) Weight: 149 lb 4 oz (67.7 kg) IBW/kg (Calculated) : 70.7 Heparin Dosing Weight: 67kg  Vital Signs: Temp: 91.9 F (33.3 C) (05/26 2000) Temp Source: Core (Comment) (05/26 2000) BP: 150/106 mmHg (05/26 1800) Pulse Rate: 46 (05/26 1642)  Labs:  Recent Labs  01/21/15 0830  01/21/15 1235 01/21/15 1434 01/21/15 1452 01/21/15 1642 01/21/15 1645 01/21/15 1709  HGB 15.8  < > 18.7* 18.7*  --   --  18.0*  --   HCT 47.3  < > 55.0* 55.0*  --   --  53.0*  --   PLT 177  --   --   --   --   --   --   --   APTT 50*  --   --   --   --  101*  --   --   LABPROT 17.3*  --   --   --   --  16.4*  --   --   INR 1.40  --   --   --   --  1.31  --   --   HEPARINUNFRC  --   --   --   --   --  0.24*  --   --   CREATININE 1.48*  < > 0.90 0.90  --   --  0.70  --   TROPONINI 0.53*  --   --   --  4.71*  --   --  6.24*  < > = values in this interval not displayed.  Estimated Creatinine Clearance: 104.6 mL/min (by C-G formula based on Cr of 0.7).  Assessment: Patient is a 54 year old male who was found down with about 10 min of no bystander CPR followed by about 45 minutes until ROSC. Potential patient had a STEMI, but EKG is not actively showing ST elevation. Undergoing hypothermia protocol.  Significant bleeding from IJ noted by nursing, troponins elevated, no immediate plan for cath at this time. Heparin level just below goal. D/w nurse after thrombipad applied, bleeding improved. Will make small rate adjustment and follow up with repeat HL in am.  Goal of Therapy:  Heparin level 0.3-0.7 units/ml Monitor platelets by anticoagulation protocol: Yes   Plan:  Increase heparin to 750 units/hr Recheck HL in 6 hours Follow up bleeding in am  Erin Hearing PharmD., BCPS Clinical Pharmacist Pager [phone] 01/21/2015 8:25  PM

## 2015-01-21 NOTE — Progress Notes (Addendum)
CONSULT NOTE - Initial Consult  Pharmacy Consult for Heparin Indication: chest pain/ACS  Allergies not on file  Patient Measurements: Weight: 176 lb 5.9 oz (80 kg) (Estimated (in ED)) Heparin Dosing Weight: 80 kg used for dosing initially  Vital Signs: Temp: 93.2 F (34 C) (05/26 0850) BP: 113/81 mmHg (05/26 0845) Pulse Rate: 80 (05/26 0850)  Labs: No results for input(s): HGB, HCT, PLT, APTT, LABPROT, INR, HEPARINUNFRC, CREATININE, CKTOTAL, CKMB, TROPONINI in the last 72 hours.  CrCl cannot be calculated (Unknown ideal weight.).   Medical History: No past medical history on file.   Assessment: Patient is a 54 year old male who was found down with about 10 min of no bystander CPR followed by about 45 minutes until ROSC. Potential patient had a STEMI, but EKG is not actively showing ST elevation. Plan is for patient to hypothermia protocol.  Further history, including PMH is unknown at this time. Will be using lower doses of heparin with anticipation of hypothermia. CT head negative for acute hemorrhage.   Goal of Therapy:  Heparin level 0.3-0.7 units/ml Monitor platelets by anticoagulation protocol: Yes    Plan:  -Initiate heparin with 2500 unit bolus followed by 650 units/hr infusion -HL in 6 hours -Daily CBC, HL -F/U with hypothermia protocol for adjustment of dose  Levester Fresh, PharmD, BCPS Clinical Pharmacist Pager [phone] 01/21/2015 8:59 AM

## 2015-01-21 NOTE — Progress Notes (Signed)
  Echocardiogram 2D Echocardiogram has been performed.  Bobbye Charleston 01/21/2015, 9:54 AM

## 2015-01-22 ENCOUNTER — Inpatient Hospital Stay (HOSPITAL_COMMUNITY): Payer: Self-pay

## 2015-01-22 ENCOUNTER — Ambulatory Visit (HOSPITAL_COMMUNITY): Payer: Self-pay

## 2015-01-22 DIAGNOSIS — I4891 Unspecified atrial fibrillation: Secondary | ICD-10-CM

## 2015-01-22 DIAGNOSIS — G9340 Encephalopathy, unspecified: Secondary | ICD-10-CM

## 2015-01-22 DIAGNOSIS — J9601 Acute respiratory failure with hypoxia: Secondary | ICD-10-CM | POA: Insufficient documentation

## 2015-01-22 LAB — GLUCOSE, CAPILLARY
GLUCOSE-CAPILLARY: 108 mg/dL — AB (ref 65–99)
GLUCOSE-CAPILLARY: 125 mg/dL — AB (ref 65–99)
GLUCOSE-CAPILLARY: 146 mg/dL — AB (ref 65–99)
GLUCOSE-CAPILLARY: 158 mg/dL — AB (ref 65–99)
GLUCOSE-CAPILLARY: 132 mg/dL — AB (ref 65–99)
GLUCOSE-CAPILLARY: 123 mg/dL — AB (ref 65–99)
GLUCOSE-CAPILLARY: 112 mg/dL — AB (ref 65–99)
Glucose-Capillary: 105 mg/dL — ABNORMAL HIGH (ref 65–99)
Glucose-Capillary: 111 mg/dL — ABNORMAL HIGH (ref 65–99)
Glucose-Capillary: 119 mg/dL — ABNORMAL HIGH (ref 65–99)
Glucose-Capillary: 148 mg/dL — ABNORMAL HIGH (ref 65–99)
Glucose-Capillary: 157 mg/dL — ABNORMAL HIGH (ref 65–99)

## 2015-01-22 LAB — BASIC METABOLIC PANEL
ANION GAP: 10 (ref 5–15)
ANION GAP: 11 (ref 5–15)
Anion gap: 10 (ref 5–15)
Anion gap: 12 (ref 5–15)
Anion gap: 6 (ref 5–15)
Anion gap: 9 (ref 5–15)
BUN: 11 mg/dL (ref 6–20)
BUN: 12 mg/dL (ref 6–20)
BUN: 12 mg/dL (ref 6–20)
BUN: 13 mg/dL (ref 6–20)
BUN: 14 mg/dL (ref 6–20)
BUN: 16 mg/dL (ref 6–20)
CALCIUM: 8.1 mg/dL — AB (ref 8.9–10.3)
CALCIUM: 8.1 mg/dL — AB (ref 8.9–10.3)
CALCIUM: 7.9 mg/dL — AB (ref 8.9–10.3)
CHLORIDE: 110 mmol/L (ref 101–111)
CO2: 14 mmol/L — ABNORMAL LOW (ref 22–32)
CO2: 14 mmol/L — ABNORMAL LOW (ref 22–32)
CO2: 15 mmol/L — AB (ref 22–32)
CO2: 15 mmol/L — ABNORMAL LOW (ref 22–32)
CO2: 16 mmol/L — ABNORMAL LOW (ref 22–32)
CO2: 18 mmol/L — AB (ref 22–32)
CREATININE: 0.7 mg/dL (ref 0.61–1.24)
CREATININE: 0.57 mg/dL — AB (ref 0.61–1.24)
Calcium: 8 mg/dL — ABNORMAL LOW (ref 8.9–10.3)
Calcium: 8.1 mg/dL — ABNORMAL LOW (ref 8.9–10.3)
Calcium: 8.2 mg/dL — ABNORMAL LOW (ref 8.9–10.3)
Chloride: 110 mmol/L (ref 101–111)
Chloride: 111 mmol/L (ref 101–111)
Chloride: 112 mmol/L — ABNORMAL HIGH (ref 101–111)
Chloride: 113 mmol/L — ABNORMAL HIGH (ref 101–111)
Chloride: 113 mmol/L — ABNORMAL HIGH (ref 101–111)
Creatinine, Ser: 0.59 mg/dL — ABNORMAL LOW (ref 0.61–1.24)
Creatinine, Ser: 0.66 mg/dL (ref 0.61–1.24)
Creatinine, Ser: 0.68 mg/dL (ref 0.61–1.24)
Creatinine, Ser: 0.79 mg/dL (ref 0.61–1.24)
GFR calc Af Amer: >60 mL/min (ref >60)
GFR calc Af Amer: >60 mL/min (ref >60)
GFR calc Af Amer: >60 mL/min (ref >60)
GFR calc Af Amer: >60 mL/min (ref >60)
GFR calc Af Amer: >60 mL/min (ref >60)
GFR calc non Af Amer: >60 mL/min (ref >60)
GFR calc non Af Amer: >60 mL/min (ref >60)
GFR calc non Af Amer: >60 mL/min (ref >60)
GLUCOSE: 104 mg/dL — AB (ref 65–99)
Glucose, Bld: 105 mg/dL — ABNORMAL HIGH (ref 65–99)
Glucose, Bld: 119 mg/dL — ABNORMAL HIGH (ref 65–99)
Glucose, Bld: 124 mg/dL — ABNORMAL HIGH (ref 65–99)
Glucose, Bld: 126 mg/dL — ABNORMAL HIGH (ref 65–99)
Glucose, Bld: 153 mg/dL — ABNORMAL HIGH (ref 65–99)
Potassium: 3.4 mmol/L — ABNORMAL LOW (ref 3.5–5.1)
Potassium: 3.8 mmol/L (ref 3.5–5.1)
Potassium: 3.9 mmol/L (ref 3.5–5.1)
Potassium: 4.1 mmol/L (ref 3.5–5.1)
Potassium: 5.3 mmol/L — ABNORMAL HIGH (ref 3.5–5.1)
Potassium: 5.6 mmol/L — ABNORMAL HIGH (ref 3.5–5.1)
SODIUM: 135 mmol/L (ref 135–145)
SODIUM: 137 mmol/L (ref 135–145)
Sodium: 137 mmol/L (ref 135–145)
Sodium: 138 mmol/L (ref 135–145)
Sodium: 137 mmol/L (ref 135–145)
Sodium: 135 mmol/L (ref 135–145)

## 2015-01-22 LAB — CBC
HCT: 48.9 % (ref 39.0–52.0)
Hemoglobin: 17.1 g/dL — ABNORMAL HIGH (ref 13.0–17.0)
MCH: 28.3 pg (ref 26.0–34.0)
MCHC: 35 g/dL (ref 30.0–36.0)
MCV: 80.8 fL (ref 78.0–100.0)
Platelets: 172 10*3/uL (ref 150–400)
RBC: 6.05 MIL/uL — ABNORMAL HIGH (ref 4.22–5.81)
RDW: 13.7 % (ref 11.5–15.5)
WBC: 24.5 10*3/uL — ABNORMAL HIGH (ref 4.0–10.5)

## 2015-01-22 LAB — PROCALCITONIN: Procalcitonin: 3.14 ng/mL

## 2015-01-22 LAB — TROPONIN I: Troponin I: 3.44 ng/mL (ref <0.031)

## 2015-01-22 LAB — HEPARIN LEVEL (UNFRACTIONATED)
HEPARIN UNFRACTIONATED: 0.4 [IU]/mL (ref 0.30–0.70)
Heparin Unfractionated: 0.53 IU/mL (ref 0.30–0.70)
Heparin Unfractionated: 0.26 IU/mL — ABNORMAL LOW (ref 0.30–0.70)

## 2015-01-22 LAB — MAGNESIUM: Magnesium: 1.8 mg/dL (ref 1.7–2.4)

## 2015-01-22 MED ORDER — HEPARIN (PORCINE) IN NACL 100-0.45 UNIT/ML-% IJ SOLN
1250.0000 [IU]/h | INTRAMUSCULAR | Status: DC
  Administered 2015-01-23: 900 [IU]/h via INTRAVENOUS
  Filled 2015-01-22 (×3): qty 250

## 2015-01-22 MED ORDER — ASPIRIN 81 MG PO CHEW
81.0000 mg | CHEWABLE_TABLET | Freq: Every day | ORAL | Status: DC
  Administered 2015-01-22 – 2015-01-24 (×2): 81 mg via ORAL
  Filled 2015-01-22 (×3): qty 1

## 2015-01-22 MED ORDER — PERFLUTREN LIPID MICROSPHERE
INTRAVENOUS | Status: AC
  Filled 2015-01-22: qty 10

## 2015-01-22 MED ORDER — POTASSIUM CHLORIDE 10 MEQ/50ML IV SOLN
10.0000 meq | INTRAVENOUS | Status: AC
  Administered 2015-01-22 (×2): 10 meq via INTRAVENOUS
  Filled 2015-01-22 (×2): qty 50

## 2015-01-22 MED ORDER — MAGNESIUM SULFATE 2 GM/50ML IV SOLN
2.0000 g | Freq: Once | INTRAVENOUS | Status: AC
  Administered 2015-01-22: 2 g via INTRAVENOUS
  Filled 2015-01-22: qty 50

## 2015-01-22 MED ORDER — PERFLUTREN LIPID MICROSPHERE
1.0000 mL | INTRAVENOUS | Status: AC | PRN
  Administered 2015-01-22: 2 mL via INTRAVENOUS
  Filled 2015-01-22: qty 10

## 2015-01-22 MED ORDER — SODIUM CHLORIDE 0.9 % IV SOLN: INTRAVENOUS | Status: DC

## 2015-01-22 MED ORDER — SODIUM CHLORIDE 0.9 % IV SOLN
3.0000 g | Freq: Four times a day (QID) | INTRAVENOUS | Status: DC
  Administered 2015-01-22 – 2015-01-25 (×11): 3 g via INTRAVENOUS
  Filled 2015-01-22 (×18): qty 3

## 2015-01-22 NOTE — Progress Notes (Signed)
ANTICOAGULATION CONSULT NOTE - Follow Up Consult  Pharmacy Consult for heparin Indication: chest pain/ACS  No Known Allergies  Patient Measurements: Height: 5\' 9"  (175.3 cm) Weight: 149 lb 0.5 oz (67.6 kg) IBW/kg (Calculated) : 70.7 Heparin Dosing Weight: 67kg  Vital Signs: Temp: 92.3 F (33.5 C) (05/27 0600) Temp Source: Core (Comment) (05/27 0600) BP: 165/108 mmHg (05/27 0600) Pulse Rate: 51 (05/27 0338)  Labs:  Recent Labs  01/21/15 0830  01/21/15 1434 01/21/15 1452 01/21/15 1642 01/21/15 1645 01/21/15 1709 01/21/15 2025 01/22/15 0010 01/22/15 0233 01/22/15 0410  HGB 15.8  < > 18.7*  --   --  18.0*  --   --   --   --  17.1*  HCT 47.3  < > 55.0*  --   --  53.0*  --   --   --   --  48.9  PLT 177  --   --   --   --   --   --   --   --   --  172  APTT 50*  --   --   --  101*  --   --   --   --   --   --   LABPROT 17.3*  --   --   --  16.4*  --   --   --   --   --   --   INR 1.40  --   --   --  1.31  --   --   --   --   --   --   HEPARINUNFRC  --   --   --   --  0.24*  --   --   --   --   --  0.40  CREATININE 1.48*  < > 0.90  --   --  0.70  --  0.89 0.79  --  0.70  TROPONINI 0.53*  --   --  4.71*  --   --  6.24*  --   --  3.44*  --   < > = values in this interval not displayed.  Estimated Creatinine Clearance: 104.5 mL/min (by C-G formula based on Cr of 0.7).  Assessment: Patient is a 54 year old male who was found down with about 10 min of no bystander CPR followed by about 45 minutes until ROSC. Potential patient had a STEMI, but EKG is not actively showing ST elevation. Undergoing hypothermia protocol.  Significant bleeding from IJ noted by nursing, troponins elevated, no immediate plan for cath at this time. Heparin level just below goal. D/w nurse after thrombipad applied, bleeding improved. Will make small rate adjustment and follow up with repeat HL in am.  HL this AM is therapeutic at 0.4 on heparin 750 units/hr. Nurse called to report that patient has oozing  even through a thrombipad. Will reduce heparin slightly and reevaluate.   Goal of Therapy:  Heparin level 0.3-0.7 units/ml Monitor platelets by anticoagulation protocol: Yes   Plan:  Decrease heparin to 700 units/hr Recheck HL in 6 hours Follow up bleeding in am  Andrey Cota. Diona Foley, PharmD Clinical Pharmacist Pager [phone]  01/22/2015 7:20 AM

## 2015-01-22 NOTE — Progress Notes (Signed)
Subjective:  Patient without know cardiac hx s/p out of hospital VF arrest on 01/21/15 without bystander CPR. He reportedly received 10rounds of Epi and multiple shocks with estimated 30-45 min to ROSC.  He was admitted to [facility] and cooling protocol initiated on 01/21/15.  Patient seen and examined this morning.  He sedated and intubated.  Note:  Patient's chart flagged for potential chart merger.  When I reviewed duplicate chart, med hx includes HCV and tobacco abuse.  Objective: Vital signs in last 24 hours: Filed Vitals:   01/22/15 0338 01/22/15 0400 01/22/15 0500 01/22/15 0600  BP: 126/80 129/93  165/108  Pulse: 51     Temp:  91.9 F (33.3 C) 92.7 F (33.7 C) 92.3 F (33.5 C)  TempSrc:  Core (Comment) Core (Comment) Core (Comment)  Resp: 24     Height:      Weight:  149 lb 0.5 oz (67.6 kg)    SpO2: 100% 100% 100% 100%   Weight change:   Intake/Output Summary (Last 24 hours) at 01/22/15 0704 Last data filed at 01/22/15 0600  Gross per 24 hour  Intake 1396.02 ml  Output   2575 ml  Net -1178.98 ml   General: sedated and intubated HEENT: ETT in place Cardiac: regular bradycardia, distant HS; no rubs, murmurs or gallops Pulm: on 50% FiO2; CTA anteriorly Abd: cooling apparatus around abdomen; abd soft, nondistended GU:  50cc of clear yellow urine in Foley bag Ext: cool, no pedal edema, 1+ DPs Neuro: sedated and intubated  Telemetry:  SB 40s  Lab Results: Basic Metabolic Panel:  Recent Labs Lab 01/22/15 0010 01/22/15 0410  NA 135 137  K 3.9 3.8  CL 111 110  CO2 14* 15*  GLUCOSE 153* 119*  BUN 16 14  CREATININE 0.79 0.70  CALCIUM 8.0* 8.1*   CBC:  Recent Labs Lab 01/21/15 0830  01/21/15 1645 01/22/15 0410  WBC 26.7*  --   --  24.5*  HGB 15.8  < > 18.0* 17.1*  HCT 47.3  < > 53.0* 48.9  MCV 86.3  --   --  80.8  PLT 177  --   --  172  < > = values in this interval not displayed. Cardiac Enzymes:  Recent Labs Lab 01/21/15 1452 01/21/15 1709  01/22/15 0233  TROPONINI 4.71* 6.24* 3.44*   CBG:  Recent Labs Lab 01/21/15 1933 01/21/15 2026 01/21/15 2133 01/21/15 2229 01/22/15 0006 01/22/15 0408  GLUCAP 108* 125* 146* 158* 157* 132*   Coagulation:  Recent Labs Lab 01/21/15 0830 01/21/15 1642  LABPROT 17.3* 16.4*  INR 1.40 1.31   Urine Drug Screen: Drugs of Abuse     Component Value Date/Time   LABOPIA NONE DETECTED 01/21/2015 0915   COCAINSCRNUR NONE DETECTED 01/21/2015 0915   LABBENZ POSITIVE* 01/21/2015 0915   AMPHETMU NONE DETECTED 01/21/2015 0915   THCU POSITIVE* 01/21/2015 0915   LABBARB NONE DETECTED 01/21/2015 0915    Studies/Results: Ct Head Wo Contrast  01/21/2015   ADDENDUM REPORT: 01/21/2015 11:20  ADDENDUM: These results were called by telephone at the time of interpretation on 01/21/2015 at 11:19 am to Dr. Gwendolyn Lima, who verbally acknowledged these results.   Electronically Signed   By: Lowella Grip III M.D.   On: 01/21/2015 11:20   01/21/2015   CLINICAL DATA:  Status post cardiac arrest  EXAM: CT HEAD WITHOUT CONTRAST  TECHNIQUE: Contiguous axial images were obtained from the base of the skull through the vertex without intravenous contrast.  COMPARISON:  None.  FINDINGS: The ventricles are normal in size and configuration. There is no intracranial mass, hemorrhage, extra-axial fluid collection, or midline shift. No focal gray-white compartment lesions are identified.  Both middle cerebral arteries show symmetric increased attenuation. Bony calvarium appears intact. The mastoid air cells are clear.  IMPRESSION: Increased attenuation is noted in both middle cerebral arteries. The significance of this symmetric finding is uncertain. Given the history, the possibility of bilateral middle cerebral artery thrombosis must be of concern. This finding may well warrant CT or MR angiography to further assess. There is no intracranial edema. Gray-white compartments appear normal. No acute hemorrhage evident.   Message left for Dr. Gwendolyn Lima with respect to this report, Jan 21, 2015, 10:56 a.m.  Electronically Signed: By: Lowella Grip III M.D. On: 01/21/2015 10:56   Dg Chest Port 1 View  01/21/2015   CLINICAL DATA:  Status post central line placement  EXAM: PORTABLE CHEST - 1 VIEW  COMPARISON:  01/21/2015  FINDINGS: Cardiac shadow is stable. An endotracheal tube, nasogastric catheter and right jugular central line are noted in satisfactory position. No pneumothorax is identified. Persistent infiltrative changes are noted within both lungs. No new focal abnormality is seen.  IMPRESSION: Status post right central line placement without evidence of pneumothorax.  The remainder of the exam is stable.   Electronically Signed   By: Inez Catalina M.D.   On: 01/21/2015 10:21   Dg Chest Portable 1 View  01/21/2015   CLINICAL DATA:  Hypoxia  EXAM: PORTABLE CHEST - 1 VIEW  COMPARISON:  April 01, 2013  FINDINGS: Endotracheal tube tip is 1.4 cm above the carina. Nasogastric tube tip and side port are below the diaphragm. No pneumothorax. There is airspace consolidation throughout the right upper lobe. Lungs elsewhere clear. Heart size and pulmonary vascularity are normal. No adenopathy. No focal bone lesions evident.  IMPRESSION: Tube positions as described without pneumothorax. Right upper lobe airspace consolidation. Differential considerations include pneumonia, contusion/hemorrhage, or possible aspiration.   Electronically Signed   By: Lowella Grip III M.D.   On: 01/21/2015 09:02   01/21/15 Limited 2D ECHO: Left ventricle: The left ventricular size appears to be normal. Possible mild anteroseptal hypokinesis. The right ventricular size and function appear grossly normal. There is no pericardial effusion.  01/21/15 EEG: Clinical Interpretation: This EEG is essentially a normal drowsy and sleep EEG though cannot rule out encephalopathy with this study. There was no seizure or seizure predisposition  recorded on this study.   Medications: I have reviewed the patient's current medications. Scheduled Meds: . sodium chloride  2,000 mL Intravenous Once  . antiseptic oral rinse  7 mL Mouth Rinse QID  . artificial tears  1 application Both Eyes 3 times per day  . chlorhexidine  15 mL Mouth Rinse BID  . cisatracurium  0.1 mg/kg Intravenous Once  . fentaNYL (SUBLIMAZE) injection  100 mcg Intravenous Once  . fentaNYL (SUBLIMAZE) injection  100 mcg Intravenous Once  . insulin aspart  2-6 Units Subcutaneous 6 times per day  . insulin glargine  10 Units Subcutaneous QHS  . midazolam  2 mg Intravenous Once  . pantoprazole (PROTONIX) IV  40 mg Intravenous QHS  . pneumococcal 23 valent vaccine  0.5 mL Intramuscular Tomorrow-1000   Continuous Infusions: . sodium chloride 10 mL/hr at 01/21/15 1207  . cisatracurium (NIMBEX) infusion 1 mcg/kg/min (01/21/15 2000)  . dextrose    . fentaNYL infusion INTRAVENOUS 400 mcg/hr (01/22/15 0600)  . heparin 750 Units/hr (01/21/15 2237)  .  midazolam (VERSED) infusion 8 mg/hr (01/22/15 0500)  . norepinephrine (LEVOPHED) Adult infusion     PRN Meds:.cisatracurium **AND** cisatracurium (NIMBEX) infusion **AND** cisatracurium, dextrose, etomidate, fentaNYL, labetalol, midazolam, rocuronium   Assessment/Plan:   VF arrest:  Trop 0.853-->4.71-->6.24-->3.44. EKG not consistent with ACS.  UDS positive for benzo and THC.  Limited ECHO shows mild anteroseptal hypokinesis. - continue heparin gtt   - follow-up complete 2D ECHO  Acute encephalopathy: reported decorticate posturing upon arrival to ED.  With downtime ~ 45 min this is concerning for hypoxic brain injury.  Reported seizure activity by bystanders.  EEG reveals a normal drowsy and sleep EEG though cannot r/o encephalopathy; no seizure activity on EEG.  Unable to fully assess mental status until rewarming.  CT reveals increased attenuation in both MCAs concerning for B/L MCA thrombosis.  CTA head is pending. -  per PCCM  ?Infection:  Elevated WBC and PCT.  Per PCCM.    LOS: 1 day   Francesca Oman, DO IMTS PGY2 on CCU Service 01/22/2015, 7:04 AM As above, patient seen and examined; intubated and unresponsive following cardiac arrest; being cooled. His enzymes suggest NSTEMI but also in setting of cardiac arrest, CPR and defibrillation. Continue heparin and ASA. Limited echo shows mildly reduced LV function. Will plan full echo today. Rewarming and vent wean per CCM. Concerned about anoxic encephalopathy. If patient recovers, will ultimately need cath +- EP eval pending results of cath. We are following. Kirk Ruths

## 2015-01-22 NOTE — Progress Notes (Signed)
[facility] Progress Note Patient Name: Orton Capell DOB: Apr 12, 1961 MRN: [id number]   Date of Service  01/22/2015  HPI/Events of Note  K+ = 5.6. Patient is warming and making urine (50 mL/hour).  No EKG changes.   eICU Interventions  Re-check K+ as scheduled at 12 midnight.     Intervention Category Intermediate Interventions: Electrolyte abnormality - evaluation and management  Sommer,Steven Eugene 01/22/2015, 8:50 PM

## 2015-01-22 NOTE — Progress Notes (Signed)
Initial Nutrition Assessment   INTERVENTION:   If pt remains on the vent recommend start the Adult Tube Feeding Protocol   NUTRITION DIAGNOSIS:  Inadequate oral intake related to inability to eat as evidenced by NPO status.   GOAL:  Patient will meet greater than or equal to 90% of their needs   MONITOR:  Vent status, Labs, Weight trends  REASON FOR ASSESSMENT:  Ventilator    ASSESSMENT: Admitted after VF arrest, ROSC > 45 minutes.  Concern for possible bilateral MCA occlusion on CT head Hypothermia protocol: started rewarming 5/27 @ 1045  Patient is currently intubated on ventilator support, has OG tube.  MV: 14.3 L/min Temp (24hrs), Avg:91.4 F (33 C), Min:89.6 F (32 C), Max:92.7 F (33.7 C)  Labs reviewed: cbgs: 123-126 Medications reviewed and include: lantus and novolog Nutrition-Focused physical exam completed. Findings are no fat depletion, no muscle depletion, and no edema.   No family present per MD note pt with remote hx of cocaine abuse.   Height:  Ht Readings from Last 1 Encounters:  01/21/15 5\' 9"  (1.753 m)    Weight:  Wt Readings from Last 1 Encounters:  01/22/15 149 lb 0.5 oz (67.6 kg)    Ideal Body Weight:  72.7 kg  Wt Readings from Last 10 Encounters:  01/22/15 149 lb 0.5 oz (67.6 kg)    BMI:  Body mass index is 22 kg/(m^2).  Estimated Nutritional Needs:  Kcal:  1874  Protein:  85-100 gram  Fluid:  > 1.8 L/day  Skin:  Reviewed, no issues  Diet Order:    NPO  EDUCATION NEEDS:  No education needs identified at this time   Intake/Output Summary (Last 24 hours) at 01/22/15 1456 Last data filed at 01/22/15 1200  Gross per 24 hour  Intake 1437.94 ml  Output   1530 ml  Net -92.06 ml    Last BM:  5/26  Maylon Peppers RD, [place], [place] Pager [phone] After Hours Pager

## 2015-01-22 NOTE — Progress Notes (Signed)
  Echocardiogram 2D Echocardiogram with Definity has been performed.  Diamond Nickel 01/22/2015, 12:50 PM

## 2015-01-22 NOTE — Progress Notes (Signed)
Upon assessment, patient had severe oozing from central line. Night shift RN states dressing had already been reinforced x2 last night and thrombipad applied. Pharmacy made aware and heparin level slightly decreased. Will continue to monitor closely.

## 2015-01-22 NOTE — Progress Notes (Signed)
ANTIBIOTIC CONSULT NOTE - INITIAL  Pharmacy Consult for Unasyn Indication: pneumonia  No Known Allergies  Patient Measurements: Height: 5\' 9"  (175.3 cm) Weight: 149 lb 0.5 oz (67.6 kg) IBW/kg (Calculated) : 70.7  Vital Signs: Temp: 91.8 F (33.2 C) (05/27 1200) Temp Source: Core (Comment) (05/27 1200) BP: 111/59 mmHg (05/27 1212) Pulse Rate: 52 (05/27 1212) Intake/Output from previous day: 05/26 0701 - 05/27 0700 In: 1461.3 [I.V.:1161.3; IV Piggyback:300] Out: 2575 [Urine:2375; Emesis/NG output:200] Intake/Output from this shift: Total I/O In: 312.2 [I.V.:312.2] Out: 305 [Urine:305]  Labs:  Recent Labs  01/21/15 0830  01/21/15 1434 01/21/15 1645  01/22/15 0010 01/22/15 0410 01/22/15 0820  WBC 26.7*  --   --   --   --   --  24.5*  --   HGB 15.8  < > 18.7* 18.0*  --   --  17.1*  --   PLT 177  --   --   --   --   --  172  --   CREATININE 1.48*  < > 0.90 0.70  < > 0.79 0.70 0.59*  < > = values in this interval not displayed. Estimated Creatinine Clearance: 104.5 mL/min (by C-G formula based on Cr of 0.59). No results for input(s): VANCOTROUGH, VANCOPEAK, VANCORANDOM, GENTTROUGH, GENTPEAK, GENTRANDOM, TOBRATROUGH, TOBRAPEAK, TOBRARND, AMIKACINPEAK, AMIKACINTROU, AMIKACIN in the last 72 hours.   Microbiology: Recent Results (from the past 720 hour(s))  MRSA PCR Screening     Status: None   Collection Time: 01/21/15 11:17 AM  Result Value Ref Range Status   MRSA by PCR NEGATIVE NEGATIVE Final    Comment:        The GeneXpert MRSA Assay (FDA approved for NASAL specimens only), is one component of a comprehensive MRSA colonization surveillance program. It is not intended to diagnose MRSA infection nor to guide or monitor treatment for MRSA infections.     Medical History: Past Medical History  Diagnosis Date  . Medical history non-contributory     Medications:  Scheduled:  . sodium chloride  2,000 mL Intravenous Once  . ampicillin-sulbactam (UNASYN) IV   3 g Intravenous Q6H  . antiseptic oral rinse  7 mL Mouth Rinse QID  . artificial tears  1 application Both Eyes 3 times per day  . aspirin  81 mg Oral Daily  . chlorhexidine  15 mL Mouth Rinse BID  . cisatracurium  0.1 mg/kg Intravenous Once  . fentaNYL (SUBLIMAZE) injection  100 mcg Intravenous Once  . fentaNYL (SUBLIMAZE) injection  100 mcg Intravenous Once  . insulin aspart  2-6 Units Subcutaneous 6 times per day  . insulin glargine  10 Units Subcutaneous QHS  . magnesium sulfate 1 - 4 g bolus IVPB  2 g Intravenous Once  . midazolam  2 mg Intravenous Once  . pantoprazole (PROTONIX) IV  40 mg Intravenous QHS  . potassium chloride  10 mEq Intravenous Q1 Hr x 2   Assessment: 33 yoM found down with about 10 minutes of no bystander CPR followed by about 45 minutes until ROSC.  Patient with an elevated WBC count but initially was thought to be due to an acute phase reaction S/P CPR.  Initial PCT was undetectable at < 0.10.  Follow-up PCT trended up to 3.14.  WBC remains elevated.  Patient is currently being rewarmed (hypothermia protocol).  LA elevated at 10.65.  SCr stable at 0.70. No cultures.  Pharmacy consulted to dose Unasyn for aspiration pneumonia.  Goal of Therapy:  Resolution of infection  Plan:  - Start Unasyn 3 g IV q6h - Monitor signs and symptoms of infection  Theron Arista, PharmD Clinical Pharmacist - Resident Pager: [phone] 5/27/20161:17 PM

## 2015-01-22 NOTE — Progress Notes (Signed)
Pt CVL still oozing with second thrombi pad, applied pressure dressing, K also trending up to 5.6 as assumed with re-warming phase. No EKG changes. Dr. Emmit Alexanders aware. Fellow plans to stop by to look at CVL. Will check BMET at 0000. Will continue to monitor.

## 2015-01-22 NOTE — Progress Notes (Signed)
PULMONARY / CRITICAL CARE MEDICINE   Name: [name] [name] MRN: [id number] DOB: Dec 19, 1960    ADMISSION DATE:  01/21/2015 CONSULTATION DATE:  5/26  REFERRING MD :  Mingo Amber  CHIEF COMPLAINT:  Post arrest   INITIAL PRESENTATION:  54 year old male admitted [facility] 5/26 s/p VF arrest. ROSC estimated at > 45 minutes.   STUDIES:  ECHO 5/26: LVEF grossly normal, mild anteroseptal hypokinesis, RV size/function normal CT head 5/26>>> possible bilateral MCA obstruction, no edema, normal Gray-white junction   SIGNIFICANT EVENTS: 4/26: initiated cooling   SUBJECTIVE: no acute events overnight  VITAL SIGNS: Temp:  [89.6 F (32 C)-93 F (33.9 C)] 92.5 F (33.6 C) (05/27 1100) Pulse Rate:  [42-63] 63 (05/27 0846) Resp:  [24] 24 (05/27 1045) BP: (109-171)/(80-114) 162/104 mmHg (05/27 1000) SpO2:  [98 %-100 %] 100 % (05/27 1100) Arterial Line BP: (110-178)/(70-105) 163/97 mmHg (05/27 1100) FiO2 (%):  [50 %] 50 % (05/27 0846) Weight:  [67.6 kg (149 lb 0.5 oz)] 67.6 kg (149 lb 0.5 oz) (05/27 0400) HEMODYNAMICS: CVP:  [3 mmHg-6 mmHg] 3 mmHg VENTILATOR SETTINGS: Vent Mode:  [-] PRVC FiO2 (%):  [50 %] 50 % Set Rate:  [24 bmp] 24 bmp Vt Set:  [570 mL] 570 mL PEEP:  [5 cmH20] 5 cmH20 Plateau Pressure:  [16 cmH20-18 cmH20] 17 cmH20 INTAKE / OUTPUT:  Intake/Output Summary (Last 24 hours) at 01/22/15 1134 Last data filed at 01/22/15 1100  Gross per 24 hour  Intake 1711.68 ml  Output   1850 ml  Net -138.32 ml    PHYSICAL EXAMINATION:  Gen: sedated, paralyzed on vent HENT: NCAT ETT in place PULM: CTA B, vent supported breaths CV: RRR, no mgr, limited by cooling pads GI: BS+, soft, nontender Derm: no cyanosis or rash Neuro: sedated, paralyzed on vent   LABS:  CBC  Recent Labs Lab 01/21/15 0830  01/21/15 1434 01/21/15 1645 01/22/15 0410  WBC 26.7*  --   --   --  24.5*  HGB 15.8  < > 18.7* 18.0* 17.1*  HCT 47.3  < > 55.0* 53.0* 48.9  PLT 177  --   --   --  172  < > =  values in this interval not displayed. Coag's  Recent Labs Lab 01/21/15 0830 01/21/15 1642  APTT 50* 101*  INR 1.40 1.31   BMET  Recent Labs Lab 01/22/15 0010 01/22/15 0410 01/22/15 0820  NA 135 137 137  K 3.9 3.8 3.4*  CL 111 110 112*  CO2 14* 15* 15*  BUN 16 14 12   CREATININE 0.79 0.70 0.59*  GLUCOSE 153* 119* 124*   Electrolytes  Recent Labs Lab 01/22/15 0010 01/22/15 0410 01/22/15 0820  CALCIUM 8.0* 8.1* 8.1*  MG  --   --  1.8   Sepsis Markers  Recent Labs Lab 01/21/15 0830 01/21/15 0839 01/22/15 0410  LATICACIDVEN  --  10.65*  --   PROCALCITON <0.10  --  3.14   ABG  Recent Labs Lab 01/21/15 0853  PHART 7.132*  PCO2ART 43.1  PO2ART 324.0*   Liver Enzymes No results for input(s): AST, ALT, ALKPHOS, BILITOT, ALBUMIN in the last 168 hours. Cardiac Enzymes  Recent Labs Lab 01/21/15 1452 01/21/15 1709 01/22/15 0233  TROPONINI 4.71* 6.24* 3.44*   Glucose  Recent Labs Lab 01/21/15 2026 01/21/15 2133 01/21/15 2229 01/22/15 0006 01/22/15 0408 01/22/15 0829  GLUCAP 125* 146* 158* 157* 132* 119*    Imaging CXR reviewed 5/27 >> diffuse hazy opacities, ETT in place, emphysema  likely   ASSESSMENT / PLAN:  NEUROLOGIC A:   Acute encephalopathy/COMA  s/p cardiac arrest. Worrisome for hypoxic injury given prolonged down time.  Seizure type activity (not witnessed by healthcare workers). Likely d/t VF and decreased cerebral perfusion  Concern for possible bilateral MCA occlusion on CT head, CTA may help define but considering prolonged down time (> 40 minutes) that in itself carries dismal prognosis so best approach is to continue hypothermia protocol P:   Hypothermia protocol  RAS not applicable given NMB gtt Will be RAS goal -2 after warming begins Nimbex/fentanyl and versed per protocol May need CT angiogram if neurologic exam improves F/U EEG from today  PULMONARY OETT 5/26>>> A: Acute cardiopulmonary arrest Acute pulmonary  edema R/o aspiration  P:   Full vent support Daily CXR See neuro section re: NMB and sedation  See ID section   CARDIOVASCULAR CVL right IJ CVL 5/26>>> A:  VF arrest  Abnormal trop but nml ECG: ? Primary VF vs non-cardiac etiology  Afib  Prolonged QTc P:  Hypothermia protocol F/u formal ECHO Heparin gtt and asa Further recs per cards. Will prob need EP eval if has cognitive recovery   RENAL A:   Lactic acidosis s/p cardiac arrest P:   COnt IVFs Monitor BMET and UOP Replace electrolytes as needed   GASTROINTESTINAL A:   No acute  P:   NPO PPI for SUP  HEMATOLOGIC A:   No acute issues P:  Trend CBC Transfuse per ICU protocol   INFECTIOUS A:   Aspiration pneumonitis? P:    Sputum 5/26>>>  start unasyn 5/26 >>>   ENDOCRINE A:   No acute   P:   Trend glucose    FAMILY  - Updates: 5/27 bedside by me  - Inter-disciplinary family meet or Palliative Care meeting due by: 6/2  My cc time 40 minutes  Roselie Awkward, MD [facility]M Pager: [phone] Cell: [phone] After 3pm or if no response, call [phone]

## 2015-01-22 NOTE — Progress Notes (Signed)
Called pharmacy about heparin rate/dose change and made them aware about increase oozing from CVL site still with thrombi pad. Pharmacist was going to come lay eyes. Will continue to monitor.

## 2015-01-22 NOTE — Progress Notes (Addendum)
ANTICOAGULATION CONSULT NOTE - Follow Up Consult  Pharmacy Consult for heparin Indication: chest pain/ACS  No Known Allergies  Patient Measurements: Height: 5\' 9"  (175.3 cm) Weight: 149 lb 0.5 oz (67.6 kg) IBW/kg (Calculated) : 70.7 Heparin Dosing Weight: 67kg  Vital Signs: Temp: 91 F (32.8 C) (05/27 1300) Temp Source: Core (Comment) (05/27 1300) BP: 111/59 mmHg (05/27 1212) Pulse Rate: 52 (05/27 1212)  Labs:  Recent Labs  01/21/15 0830  01/21/15 1434 01/21/15 1452 01/21/15 1642 01/21/15 1645 01/21/15 1709  01/22/15 0233 01/22/15 0410 01/22/15 0820 01/22/15 1230 01/22/15 1310  HGB 15.8  < > 18.7*  --   --  18.0*  --   --   --  17.1*  --   --   --   HCT 47.3  < > 55.0*  --   --  53.0*  --   --   --  48.9  --   --   --   PLT 177  --   --   --   --   --   --   --   --  172  --   --   --   APTT 50*  --   --   --  101*  --   --   --   --   --   --   --   --   LABPROT 17.3*  --   --   --  16.4*  --   --   --   --   --   --   --   --   INR 1.40  --   --   --  1.31  --   --   --   --   --   --   --   --   HEPARINUNFRC  --   --   --   --  0.24*  --   --   --   --  0.40  --   --  0.53  CREATININE 1.48*  < > 0.90  --   --  0.70  --   < >  --  0.70 0.59* 0.57*  --   TROPONINI 0.53*  --   --  4.71*  --   --  6.24*  --  3.44*  --   --   --   --   < > = values in this interval not displayed.  Estimated Creatinine Clearance: 104.5 mL/min (by C-G formula based on Cr of 0.57).  Assessment: Patient is a 54 year old male who was found down with about 10 min of no bystander CPR followed by about 45 minutes until ROSC. Potential patient had a STEMI, but EKG is not actively showing ST elevation. Undergoing hypothermia protocol - Rewarming began at 1045 this morning.  HL this AM is therapeutic at 0.4 on heparin 750 units/hr. Nurse reported that patient had oozing even through a thrombipad, so heparin rate was reduced to 700 units/hr. A 6-hr HL was therapeutic but trended up to 0.53. Per  nurse, patient still has some oozing from his catheter site but it has not worsened.  As patient is rewarming and expect him to begin clearing heparin quicker, will continue heparin at current rate.  Hgb 17.1, plts 172.    Goal of Therapy:  Heparin level 0.3-0.7 units/ml Monitor platelets by anticoagulation protocol: Yes   Plan:  Continue heparin at 700 units/hr Recheck HL in 4 hours as patient is rewarming @  1730 Follow up bleeding in am  Theron Arista, PharmD Clinical Pharmacist - Resident Pager: [phone] 5/27/20162:22 PM  Addendum:  HL this PM came back at 0.26. Pt is warming so it's not surprising. We'll increase rate to 850 units/hr and check 6 hr level  Onnie Boer, PharmD Pager: [phone] 01/22/2015 7:13 PM

## 2015-01-23 DIAGNOSIS — I214 Non-ST elevation (NSTEMI) myocardial infarction: Principal | ICD-10-CM

## 2015-01-23 LAB — GLUCOSE, CAPILLARY
GLUCOSE-CAPILLARY: 101 mg/dL — AB (ref 65–99)
GLUCOSE-CAPILLARY: 105 mg/dL — AB (ref 65–99)
GLUCOSE-CAPILLARY: 134 mg/dL — AB (ref 65–99)
Glucose-Capillary: 126 mg/dL — ABNORMAL HIGH (ref 65–99)
Glucose-Capillary: 139 mg/dL — ABNORMAL HIGH (ref 65–99)
Glucose-Capillary: 87 mg/dL (ref 65–99)

## 2015-01-23 LAB — BASIC METABOLIC PANEL WITH GFR
Anion gap: 11 (ref 5–15)
BUN: 15 mg/dL (ref 6–20)
CO2: 17 mmol/L — ABNORMAL LOW (ref 22–32)
Calcium: 8 mg/dL — ABNORMAL LOW (ref 8.9–10.3)
Chloride: 109 mmol/L (ref 101–111)
Creatinine, Ser: 0.97 mg/dL (ref 0.61–1.24)
GFR calc Af Amer: >60 mL/min
GFR calc non Af Amer: >60 mL/min
Glucose, Bld: 135 mg/dL — ABNORMAL HIGH (ref 65–99)
Potassium: 4.3 mmol/L (ref 3.5–5.1)
Sodium: 137 mmol/L (ref 135–145)

## 2015-01-23 LAB — BASIC METABOLIC PANEL
Anion gap: 8 (ref 5–15)
BUN: 13 mg/dL (ref 6–20)
CHLORIDE: 111 mmol/L (ref 101–111)
CO2: 18 mmol/L — ABNORMAL LOW (ref 22–32)
Calcium: 8.2 mg/dL — ABNORMAL LOW (ref 8.9–10.3)
Creatinine, Ser: 0.9 mg/dL (ref 0.61–1.24)
GFR calc Af Amer: >60 mL/min (ref >60)
GFR calc non Af Amer: >60 mL/min (ref >60)
Glucose, Bld: 107 mg/dL — ABNORMAL HIGH (ref 65–99)
Potassium: 5.1 mmol/L (ref 3.5–5.1)
SODIUM: 137 mmol/L (ref 135–145)

## 2015-01-23 LAB — HEPARIN LEVEL (UNFRACTIONATED)
Heparin Unfractionated: 0.33 [IU]/mL (ref 0.30–0.70)
Heparin Unfractionated: 0.31 IU/mL (ref 0.30–0.70)

## 2015-01-23 LAB — CBC
HCT: 41.3 % (ref 39.0–52.0)
Hemoglobin: 14.5 g/dL (ref 13.0–17.0)
MCH: 28 pg (ref 26.0–34.0)
MCHC: 35.1 g/dL (ref 30.0–36.0)
MCV: 79.9 fL (ref 78.0–100.0)
Platelets: 145 K/uL — ABNORMAL LOW (ref 150–400)
RBC: 5.17 MIL/uL (ref 4.22–5.81)
RDW: 13.8 % (ref 11.5–15.5)
WBC: 23 K/uL — ABNORMAL HIGH (ref 4.0–10.5)

## 2015-01-23 LAB — PROCALCITONIN: Procalcitonin: 2.28 ng/mL

## 2015-01-23 LAB — LACTIC ACID, PLASMA: LACTIC ACID, VENOUS: 2.3 mmol/L — AB (ref 0.5–2.0)

## 2015-01-23 MED ORDER — CETYLPYRIDINIUM CHLORIDE 0.05 % MT LIQD: 7.0000 mL | Freq: Two times a day (BID) | OROMUCOSAL | Status: DC

## 2015-01-23 MED ORDER — INSULIN ASPART 100 UNIT/ML ~~LOC~~ SOLN: 0.0000 [IU] | Freq: Every day | SUBCUTANEOUS | Status: DC

## 2015-01-23 MED ORDER — PROPOFOL 1000 MG/100ML IV EMUL
5.0000 ug/kg/min | INTRAVENOUS | Status: DC
  Administered 2015-01-23: 5 ug/kg/min via INTRAVENOUS
  Filled 2015-01-23: qty 100

## 2015-01-23 MED ORDER — SODIUM CHLORIDE 0.9 % IV SOLN
0.0000 ug/h | INTRAVENOUS | Status: DC
  Administered 2015-01-23: 200 ug/h via INTRAVENOUS
  Administered 2015-01-23: 25 ug/h via INTRAVENOUS
  Filled 2015-01-23: qty 50

## 2015-01-23 MED ORDER — INSULIN ASPART 100 UNIT/ML ~~LOC~~ SOLN: 0.0000 [IU] | Freq: Three times a day (TID) | SUBCUTANEOUS | Status: DC

## 2015-01-23 MED ORDER — CETYLPYRIDINIUM CHLORIDE 0.05 % MT LIQD
7.0000 mL | Freq: Two times a day (BID) | OROMUCOSAL | Status: DC
  Administered 2015-01-23 – 2015-01-24 (×3): 7 mL via OROMUCOSAL

## 2015-01-23 MED ORDER — ALPRAZOLAM 0.25 MG PO TABS
0.2500 mg | ORAL_TABLET | Freq: Once | ORAL | Status: AC
  Administered 2015-01-23: 0.25 mg via ORAL
  Filled 2015-01-23: qty 1

## 2015-01-23 MED ORDER — FENTANYL CITRATE (PF) 100 MCG/2ML IJ SOLN
12.5000 ug | INTRAMUSCULAR | Status: DC | PRN
  Administered 2015-01-23: 12.5 ug via INTRAVENOUS
  Administered 2015-01-24 – 2015-01-25 (×7): 25 ug via INTRAVENOUS
  Filled 2015-01-23 (×8): qty 2

## 2015-01-23 MED ORDER — FENTANYL CITRATE (PF) 100 MCG/2ML IJ SOLN
25.0000 ug | INTRAMUSCULAR | Status: DC | PRN
Start: 2015-01-23 — End: 2015-01-23
  Administered 2015-01-23: 100 ug via INTRAVENOUS
  Filled 2015-01-23: qty 2

## 2015-01-23 MED ORDER — ONDANSETRON HCL 4 MG/2ML IJ SOLN
4.0000 mg | Freq: Three times a day (TID) | INTRAMUSCULAR | Status: DC | PRN
  Administered 2015-01-23 – 2015-01-24 (×2): 4 mg via INTRAVENOUS
  Filled 2015-01-23 (×2): qty 2

## 2015-01-23 MED ORDER — LORAZEPAM 1 MG PO TABS
1.0000 mg | ORAL_TABLET | Freq: Once | ORAL | Status: AC
  Administered 2015-01-23: 1 mg via ORAL
  Filled 2015-01-23: qty 1

## 2015-01-23 MED ORDER — CHLORHEXIDINE GLUCONATE 0.12 % MT SOLN
15.0000 mL | Freq: Two times a day (BID) | OROMUCOSAL | Status: DC
  Administered 2015-01-24: 15 mL via OROMUCOSAL
  Filled 2015-01-23 (×5): qty 15

## 2015-01-23 NOTE — Progress Notes (Signed)
[facility] Progress Note Patient Name: [name] [name] DOB: [birth year] MRN: [id number]   Date of Service  01/23/2015  HPI/Events of Note  Anxiety not improved with Xanax PO.   eICU Interventions  Will try Ativan 1 mg PO now.      Intervention Category Minor Interventions: Agitation / anxiety - evaluation and management  Lysle Dingwall 01/23/2015, 9:32 PM

## 2015-01-23 NOTE — Progress Notes (Signed)
Patient sitting up in bed, agitated, stating pads are too hot and he needs water. Dr. Lake Bells made aware. New orders to discontinue normothermia, and allow patient to drink water as tolerated. Will continue to monitor closely.

## 2015-01-23 NOTE — Progress Notes (Signed)
[facility] Progress Note Patient Name: [name] [name] DOB: [birth year] MRN: [id number]   Date of Service  01/23/2015  HPI/Events of Note  Multiple requests: 1. Patient nauseated, 2. D/C CVP Q 4 hours, 3. Change Novolog SSI from Q 4 hours to AC/HS and 4. Request for antianxiety medication. Video assessment - patient does't look anxious. Urine output and BP adequate.   eICU Interventions  Will order: 1. Zofran IV. Monitor QTc interval. 2. D/C Q 4 hour CVP monitoring.  3. Novolog SSI changed from Q 4 hours to AC/HS. 4. No antianxiety therapy indicated at this time.     Intervention Category Intermediate Interventions: Hyperglycemia - evaluation and treatment Minor Interventions: Routine modifications to care plan (e.g. PRN medications for pain, fever)  Dotti Busey [place] 01/23/2015, 3:38 PM

## 2015-01-23 NOTE — Progress Notes (Signed)
PULMONARY / CRITICAL CARE MEDICINE   Name: [name] [name] MRN: [id number] DOB: [birth year]    ADMISSION DATE:  01/21/2015 CONSULTATION DATE:  5/26  REFERRING MD :  Mingo Amber  CHIEF COMPLAINT:  Post arrest   INITIAL PRESENTATION:  54 year old male admitted to [facility] 5/26 s/p VF arrest. ROSC estimated at > 45 minutes.   STUDIES:  ECHO 5/26: LVEF grossly normal, mild anteroseptal hypokinesis, RV size/function normal CT head 5/26>>> possible bilateral MCA obstruction, no edema, normal Gray-white junction Echo 5/27 : LVEF 35%, inferior septum dyskinetic, RV function mildly reduced  SIGNIFICANT EVENTS: 5/26: initiated cooling  5/28 re-warmed, interactive and following commands  SUBJECTIVE: rewarmed yesterday, interactive and following commands  VITAL SIGNS: Temp:  [90 F (32.2 C)-98.6 F (37 C)] 98.2 F (36.8 C) (05/28 0700) Pulse Rate:  [52-86] 82 (05/28 0327) Resp:  [23-28] 23 (05/28 0327) BP: (93-163)/(58-108) 93/61 mmHg (05/28 0400) SpO2:  [97 %-100 %] 100 % (05/28 0700) Arterial Line BP: (103-167)/(54-98) 110/55 mmHg (05/28 0700) FiO2 (%):  [50 %] 50 % (05/28 0400) HEMODYNAMICS: CVP:  [3 mmHg-5 mmHg] 5 mmHg VENTILATOR SETTINGS: Vent Mode:  [-] PRVC FiO2 (%):  [50 %] 50 % Set Rate:  [24 bmp] 24 bmp Vt Set:  [570 mL] 570 mL PEEP:  [5 cmH20] 5 cmH20 Plateau Pressure:  [16 cmH20-18 cmH20] 16 cmH20 INTAKE / OUTPUT:  Intake/Output Summary (Last 24 hours) at 01/23/15 0751 Last data filed at 01/23/15 0600  Gross per 24 hour  Intake 1570.26 ml  Output   1125 ml  Net 445.26 ml    PHYSICAL EXAMINATION:  Gen: awake on vent HENT: NCAT ETT in place PULM: CTA B, vent supported breaths CV: RRR, no mgr, limited by cooling pads GI: BS+, soft, nontender Derm: no cyanosis or rash Neuro: sleepy but arouses, follows commands, moves all four ext  LABS:  CBC  Recent Labs Lab 01/21/15 0830  01/21/15 1645 01/22/15 0410 01/23/15 0335  WBC 26.7*  --   --  24.5* 23.0*  HGB 15.8   < > 18.0* 17.1* 14.5  HCT 47.3  < > 53.0* 48.9 41.3  PLT 177  --   --  172 145*  < > = values in this interval not displayed. Coag's  Recent Labs Lab 01/21/15 0830 01/21/15 1642  APTT 50* 101*  INR 1.40 1.31   BMET  Recent Labs Lab 01/22/15 1920 01/22/15 2345 01/23/15 0335  NA 135 137 137  K 5.6* 5.1 4.3  CL 110 111 109  CO2 16* 18* 17*  BUN 12 13 15   CREATININE 0.68 0.90 0.97  GLUCOSE 105* 107* 135*   Electrolytes  Recent Labs Lab 01/22/15 0820  01/22/15 1920 01/22/15 2345 01/23/15 0335  CALCIUM 8.1*  < > 7.9* 8.2* 8.0*  MG 1.8  --   --   --   --   < > = values in this interval not displayed. Sepsis Markers  Recent Labs Lab 01/21/15 0830 01/21/15 0839 01/22/15 0410 01/23/15 0335  LATICACIDVEN  --  10.65*  --   --   PROCALCITON <0.10  --  3.14 2.28   ABG  Recent Labs Lab 01/21/15 0853  PHART 7.132*  PCO2ART 43.1  PO2ART 324.0*   Liver Enzymes No results for input(s): AST, ALT, ALKPHOS, BILITOT, ALBUMIN in the last 168 hours. Cardiac Enzymes  Recent Labs Lab 01/21/15 1452 01/21/15 1709 01/22/15 0233  TROPONINI 4.71* 6.24* 3.44*   Glucose  Recent Labs Lab 01/22/15 0408 01/22/15 0829 01/22/15 1244  01/22/15 1702 01/22/15 1919 01/22/15 2342  GLUCAP 132* 119* 123* 105* 112* 101*    Imaging CXR reviewed 5/27 >> diffuse hazy opacities, ETT in place, emphysema likely   ASSESSMENT / PLAN:  NEUROLOGIC A:   Acute encephalopathy/COMA  s/p cardiac arrest> now awake, following commands Exam not consistent with bilateral MCA thrombosis  P:   Stop sedation RASS goal 0 Try to extubate today  PULMONARY OETT 5/26>>> A: Acute pulmonary edema resolved ? Aspiration pneumonitis P:   SBT now Daily CXR See ID section   CARDIOVASCULAR CVL right IJ CVL 5/26>>> A:  VF arrest  Cardiomyopathy by echo 5/28 NSTEMI Afib > resolved Prolonged QTc P:  Repeat 12 lead EKG now Continue Heparin gtt and asa Further recs per cards. Needs  LHC and/or EP study  RENAL A:   Lactic acidosis s/p cardiac arrest > presumably resolved Non-anion gap acidosis P:   Repeat lactic acid now Monitor BMET and UOP Replace electrolytes as needed  GASTROINTESTINAL A:   No acute issues P:   NPO PPI for SUP  HEMATOLOGIC A:   No acute issues P:  Trend CBC Transfuse per ICU protocol   INFECTIOUS A:   Aspiration pneumonitis? CXR improving P:    Sputum 5/26>>>  unasyn 5/27 >>> f/u CXR 5/28, consider stopping   ENDOCRINE A:   No acute   P:   Trend glucose    FAMILY  - Updates: 5/28 bedside by me  - Inter-disciplinary family meet or Palliative Care meeting due by: 6/2  My cc time 40 minutes  Roselie Awkward, MD [place] P[facility]M Pager: [phone] Cell: [phone] After 3pm or if no response, call [phone]

## 2015-01-23 NOTE — Progress Notes (Addendum)
Wasted 45ml of versed and 213ml of fentanyl in sink. Witnessed by Julien Girt, RN and Bobbe Medico, RN.

## 2015-01-23 NOTE — Progress Notes (Signed)
Pt very agitated with breathing tube, follows command. Called Dr. Elsworth Soho, orders were to start with fentanyl gtt, pt still very agitated pulling at tube, orders then to start propofol gtt. Will continue to monitor closely.

## 2015-01-23 NOTE — Progress Notes (Signed)
[place] Progress Note Patient Name: [name] [name] DOB: Jan 23, 1961 MRN: [id number]   Date of Service  01/23/2015  HPI/Events of Note  Multiple issues: 1. Blood glucose = 105 on sensitive  Novolog SSI and 2. Family wants him to have something for sleep.   eICU Interventions  Will order: 1. D/C Lantus. 2. Xanax 0.25 PO at HS X 1 for sleep.      Intervention Category Intermediate Interventions: Hyperglycemia - evaluation and treatment Minor Interventions: Agitation / anxiety - evaluation and management  Sommer,Steven Eugene 01/23/2015, 8:07 PM

## 2015-01-23 NOTE — Procedures (Signed)
Extubation Procedure Note  Patient Details:   Name: [name] [name] DOB: 1960-11-25 MRN: [id number]   Airway Documentation:  Airway 8 mm (Active)  Secured at (cm) 26 cm 01/23/2015  3:27 AM  Measured From Lips 01/23/2015  3:27 AM  Secured Location Right 01/23/2015  3:27 AM  Secured By [employer] 01/23/2015  3:27 AM  Tube Holder Repositioned Yes 01/23/2015  3:27 AM  Cuff Pressure (cm H2O) 25 cm H2O 01/22/2015  7:47 PM  Site Condition Dry 01/22/2015  3:47 PM  Pt extubated to 4lpm [place], incentive done 1500cc's. No distress noted at this time.  Evaluation  O2 sats: stable throughout Complications: No apparent complications Patient did tolerate procedure well. Bilateral Breath Sounds: Clear Suctioning: Oral, Airway Yes  Donella Stade 01/23/2015, 9:14 AM

## 2015-01-23 NOTE — Progress Notes (Addendum)
Subjective:  Patient without know cardiac hx s/p out of hospital VF arrest on 01/21/15 without bystander CPR. He reportedly received 10rounds of Epi and multiple shocks with estimated 30-45 min to ROSC.  He was admitted to [facility] and cooling protocol initiated on 01/21/15.  Daughter and sister at bedside. Sedatives have ben turned off and patient is awake, following commands and responding to questions. He denies having pain, but is clearly uncomfortable with the presence of the ET tube.  Note:  Patient's chart flagged for potential chart merger.  Duplicate chart, med hx includes HCV and tobacco abuse.  Objective: Vital signs in last 24 hours: Filed Vitals:   01/23/15 0400 01/23/15 0500 01/23/15 0600 01/23/15 0700  BP: 93/61     Pulse:      Temp: 98.6 F (37 C)   98.2 F (36.8 C)  TempSrc: Core (Comment)   Core (Comment)  Resp:      Height:      Weight:      SpO2: 97% 99% 99% 100%   Weight change:   Intake/Output Summary (Last 24 hours) at 01/23/15 0802 Last data filed at 01/23/15 0700  Gross per 24 hour  Intake 1543.8 ml  Output    975 ml  Net  568.8 ml   General: awake, slightly drowsy, intermittently restless due to presnce of ET tube and makes attempts to pull it out. HEENT: ETT in place Cardiac: Regular, S1 and S2 heard, no rubs, murmurs or gallops Pulm: On vent, CTA anteriorly Abd: abd soft, nondistended Ext: DP pulses present, no edema, moving all extremities. Neuro: Responds to questions, mouths he is in the hospital, blinks eyes to commands, nods head to deny pain  Telemetry:  Sinus rhythm mostly, rates- 70-80s, one episode of NSVT.  Lab Results: Basic Metabolic Panel:  Recent Labs Lab 01/22/15 0820  01/22/15 2345 01/23/15 0335  NA 137  < > 137 137  K 3.4*  < > 5.1 4.3  CL 112*  < > 111 109  CO2 15*  < > 18* 17*  GLUCOSE 124*  < > 107* 135*  BUN 12  < > 13 15  CREATININE 0.59*  < > 0.90 0.97  CALCIUM 8.1*  < > 8.2* 8.0*  MG 1.8  --   --   --   < >  = values in this interval not displayed. CBC:  Recent Labs Lab 01/22/15 0410 01/23/15 0335  WBC 24.5* 23.0*  HGB 17.1* 14.5  HCT 48.9 41.3  MCV 80.8 79.9  PLT 172 145*   Cardiac Enzymes:  Recent Labs Lab 01/21/15 1452 01/21/15 1709 01/22/15 0233  TROPONINI 4.71* 6.24* 3.44*   CBG:  Recent Labs Lab 01/22/15 0408 01/22/15 0829 01/22/15 1244 01/22/15 1702 01/22/15 1919 01/22/15 2342  GLUCAP 132* 119* 123* 105* 112* 101*   Coagulation:  Recent Labs Lab 01/21/15 0830 01/21/15 1642  LABPROT 17.3* 16.4*  INR 1.40 1.31   Urine Drug Screen: Drugs of Abuse     Component Value Date/Time   LABOPIA NONE DETECTED 01/21/2015 0915   COCAINSCRNUR NONE DETECTED 01/21/2015 0915   LABBENZ POSITIVE* 01/21/2015 0915   AMPHETMU NONE DETECTED 01/21/2015 0915   THCU POSITIVE* 01/21/2015 0915   LABBARB NONE DETECTED 01/21/2015 0915    Studies/Results: Ct Head Wo Contrast  01/21/2015   ADDENDUM REPORT: 01/21/2015 11:20  ADDENDUM: These results were called by telephone at the time of interpretation on 01/21/2015 at 11:19 am to Dr. Gwendolyn Lima, who verbally acknowledged these results.  Electronically Signed   By: Lowella Grip III M.D.   On: 01/21/2015 11:20   01/21/2015   CLINICAL DATA:  Status post cardiac arrest  EXAM: CT HEAD WITHOUT CONTRAST  TECHNIQUE: Contiguous axial images were obtained from the base of the skull through the vertex without intravenous contrast.  COMPARISON:  None.  FINDINGS: The ventricles are normal in size and configuration. There is no intracranial mass, hemorrhage, extra-axial fluid collection, or midline shift. No focal gray-white compartment lesions are identified.  Both middle cerebral arteries show symmetric increased attenuation. Bony calvarium appears intact. The mastoid air cells are clear.  IMPRESSION: Increased attenuation is noted in both middle cerebral arteries. The significance of this symmetric finding is uncertain. Given the history, the  possibility of bilateral middle cerebral artery thrombosis must be of concern. This finding may well warrant CT or MR angiography to further assess. There is no intracranial edema. Gray-white compartments appear normal. No acute hemorrhage evident.  Message left for Dr. Gwendolyn Lima with respect to this report, Jan 21, 2015, 10:56 a.m.  Electronically Signed: By: Lowella Grip III M.D. On: 01/21/2015 10:56   Dg Chest Port 1 View  01/22/2015   CLINICAL DATA:  Acute respiratory failure with hypoxia  EXAM: PORTABLE CHEST - 1 VIEW  COMPARISON:  01/21/2015  FINDINGS: Support devices remain in stable position. Interval improvement in aeration with resolution of bilateral airspace opacities. No confluent opacities. Heart is normal size. No effusions or pneumothorax. No acute bony abnormality.  IMPRESSION: Interval resolution of bilateral airspace disease. Support devices stable.   Electronically Signed   By: Rolm Baptise M.D.   On:   01/21/15 Limited 2D ECHO: EF- 35%,  Apex and lateral walls move a little better than the other walls. The inferior septum is dyskinetic. The cavity size was normal. Wall thickness was increased in a pattern of mild LVH. The estimated ejection fraction was 35%. - Right ventricle: The cavity size was normal. Systolic function was mildly reduced.  01/21/15 EEG: Clinical Interpretation: This EEG is essentially a normal drowsy and sleep EEG though cannot rule out encephalopathy with this study. There was no seizure or seizure predisposition recorded on this study.   Medications: I have reviewed the patient's current medications. Scheduled Meds: . sodium chloride  2,000 mL Intravenous Once  . ampicillin-sulbactam (UNASYN) IV  3 g Intravenous Q6H  . antiseptic oral rinse  7 mL Mouth Rinse QID  . aspirin  81 mg Oral Daily  . chlorhexidine  15 mL Mouth Rinse BID  . cisatracurium  0.1 mg/kg Intravenous Once  . fentaNYL (SUBLIMAZE) injection  100 mcg Intravenous Once    . fentaNYL (SUBLIMAZE) injection  100 mcg Intravenous Once  . insulin aspart  2-6 Units Subcutaneous 6 times per day  . insulin glargine  10 Units Subcutaneous QHS  . midazolam  2 mg Intravenous Once  . pantoprazole (PROTONIX) IV  40 mg Intravenous QHS   Continuous Infusions: . sodium chloride 10 mL/hr at 01/23/15 0700  . sodium chloride Stopped (01/22/15 1815)  . dextrose    . fentaNYL infusion INTRAVENOUS 200 mcg/hr (01/23/15 0700)  . heparin 850 Units/hr (01/23/15 0700)  . midazolam (VERSED) infusion Stopped (01/22/15 2230)  . norepinephrine (LEVOPHED) Adult infusion    . propofol (DIPRIVAN) infusion Stopped (01/23/15 0730)   PRN Meds:.dextrose, etomidate, fentaNYL (SUBLIMAZE) injection, labetalol, midazolam, rocuronium   Assessment/Plan:   VF arrest: Patient neurologic status appears to be intact, despite initial poor prognosis- prolonged CPR- ~32mins, Lactic acid- ~  10 on admission, decorticate posturing, CT head showing possible Bilat MCA emboli. Tele so far has not showed irreg HR or atria fib. Trop 0.853-->4.71-->6.24-->3.44. EKG not consistent with ACS.  UDS positive for benzo and THC.  Limited ECHO shows mild anteroseptal hypokinesis. - EKG this am pending - continue heparin gtt   - follow-up complete 2D ECHO  Cardiomyopathy- EF- 35%, inferior septum dyskinetic. Rule out ischemic cardiomyopathy with Cath.  - Plan for Cath - Aspirin 81mg  daily, cont - Cont IV heparin - Consult EP for ICD - Consider starting low dose BB- Coreg- 3.125mg  daily - Consider starting Lipitor 40mg  daily - ACE inh after Cath  Acute encephalopathy: Resolving. CT head showed possible Bilat. CT reveals increased attenuation in both MCAs concerning for B/L MCA thrombosis. Recs for CTA head or MRI head. Concern for cardio embolic source. - per PCCM  Leukocytosis:  Elevated WBC- 23 today and PCT. On unasyn for possible aspiration PNA, chest xray reveals resolution of bilat airspace dx. - Per  PCCM.   LOS: 2 days   Ejiro Emokpae. IMTS PGY2- resident on CCU Service 01/23/2015, 8:02 AM    The patient was seen, examined and discussed with Bing Neighbors, MD and I agree with the above.   55 year old male, smoker who is s/p SCD at home with prolonged resuscitation, s/p hypothermia protocol, who neurologically recovered, plan to extubate today. NSTEMI peak troponin 6.24, now downtrending, echo shows LVEF 35% with regional wall motion abnormalities. We will plan for a left heart cath on Monday. Currently on ASA, heparin drip, hypotensive - no room for BB yet.   We will add lipitor once he is extubated.  Dorothy Spark 01/23/2015

## 2015-01-23 NOTE — Evaluation (Signed)
Clinical/Bedside Swallow Evaluation Patient Details  Name: [name] [name] MRN: [id number] Date of Birth: 27-Jun-1961  Today's Date: 01/23/2015 Time: SLP Start Time (ACUTE ONLY): 1142 SLP Stop Time (ACUTE ONLY): 1201 SLP Time Calculation (min) (ACUTE ONLY): 19 min  Past Medical History:  Past Medical History  Diagnosis Date  . Medical history non-contributory    Past Surgical History:  Past Surgical History  Procedure Laterality Date  . No past surgeries     HPI:      Assessment / Plan / Recommendation Clinical Impression  Pt eager for POs yet displayed impulsivity in sip size and rate of consumption. Despite impulsivity pts swallow appearing grossly within functional limits. No overt signs or symptoms of aspriation with any consistencies observed including puree, thin liquid, and regular consistencies. Education provided to patient and family regarding patients increased risk of aspiration secondary to recent extubation and decreased respiratory status.  Continue skilled swallow intervention for diet tolerance and implementation of safe swallow strategies.     Aspiration Risk  Moderate    Diet Recommendation Thin   Medication Administration: Whole meds with liquid Compensations: Small sips/bites;Slow rate;Follow solids with liquid    Other  Recommendations Oral Care Recommendations: Oral care BID   Follow Up Recommendations       Frequency and Duration min 2x/week  2 weeks   Pertinent Vitals/Pain     SLP Swallow Goals     Swallow Study Prior Functional Status       General Date of Onset: 01/21/15 Type of Study: Bedside swallow evaluation Diet Prior to this Study: NPO Temperature Spikes Noted: No Respiratory Status: Room air History of Recent Intubation: Yes Length of Intubations (days): 2 days Date extubated: 01/23/15 Behavior/Cognition: Impulsive;Alert;Lethargic/Drowsy Oral Cavity - Dentition:  (upper and lower dentures) Self-Feeding Abilities: Able to feed  self Patient Positioning: Upright in bed Baseline Vocal Quality: Low vocal intensity Volitional Cough: Strong Volitional Swallow: Able to elicit    Oral/Motor/Sensory Function Overall Oral Motor/Sensory Function: Appears within functional limits for tasks assessed Labial ROM: Within Functional Limits Labial Symmetry: Within Functional Limits Labial Strength: Within Functional Limits Lingual ROM: Within Functional Limits Lingual Symmetry: Within Functional Limits Lingual Strength: Within Functional Limits Facial ROM: Within Functional Limits   Ice Chips Ice chips: Within functional limits Presentation: Self Fed   Thin Liquid Thin Liquid: Within functional limits Presentation: Straw;Self Fed;Cup    Nectar Thick Nectar Thick Liquid: Not tested   Honey Thick Honey Thick Liquid: Not tested   Puree Puree: Within functional limits Presentation: Self Fed   Solid   GO   Arvil Chaco MA, CCC-SLP Acute Care Speech Language Pathologist    Solid: Within functional limits Presentation: [place] 01/23/2015,12:08 PM

## 2015-01-23 NOTE — Progress Notes (Signed)
ANTICOAGULATION CONSULT NOTE - Follow Up Consult  Pharmacy Consult for Heparin Indication: Chest pain / ACS  No Known Allergies  Patient Measurements: Height: 5\' 9"  (175.3 cm) Weight: 149 lb 0.5 oz (67.6 kg) IBW/kg (Calculated) : 70.7 Heparin Dosing Weight: 68 kg  Vital Signs: Temp: 96.6 F (35.9 C) (05/28 1144) Temp Source: Axillary (05/28 1144) BP: 127/91 mmHg (05/28 0800) Pulse Rate: 82 (05/28 0327)  Labs:  Recent Labs  01/21/15 0830  01/21/15 1452  01/21/15 1642 01/21/15 1645 01/21/15 1709  01/22/15 0233 01/22/15 0410  01/22/15 1310 01/22/15 1658  01/22/15 1920 01/22/15 2345 01/23/15 0335  HGB 15.8  < >  --   --   --  18.0*  --   --   --  17.1*  --   --   --   --   --   --  14.5  HCT 47.3  < >  --   --   --  53.0*  --   --   --  48.9  --   --   --   --   --   --  41.3  PLT 177  --   --   --   --   --   --   --   --  172  --   --   --   --   --   --  145*  APTT 50*  --   --   --  101*  --   --   --   --   --   --   --   --   --   --   --   --   LABPROT 17.3*  --   --   --  16.4*  --   --   --   --   --   --   --   --   --   --   --   --   INR 1.40  --   --   --  1.31  --   --   --   --   --   --   --   --   --   --   --   --   HEPARINUNFRC  --   --   --   < > 0.24*  --   --   --   --  0.40  --  0.53 0.26*  --   --   --  0.33  CREATININE 1.48*  < >  --   --   --  0.70  --   < >  --  0.70  < >  --   --   < > 0.68 0.90 0.97  TROPONINI 0.53*  --  4.71*  --   --   --  6.24*  --  3.44*  --   --   --   --   --   --   --   --   < > = values in this interval not displayed.  Estimated Creatinine Clearance: 86.1 mL/min (by C-G formula based on Cr of 0.97).   Assessment: Patient is a 54 year old male who was found down with about 10 min of no bystander CPR followed by about 45 minutes until ROSC. Potential patient had a STEMI, but EKG is not actively showing ST elevation. Underwent hypothermia protocol and rewarmed yesterday. HL this AM is therapeutic at 0.33 on heparin 850  units/hr and confirmatory was 0.31 around noon. No issues with infusion per nurse. Pt has known oozing from catheter site but currently not a problem. Hgb 14.5, plts 145.  Goal of Therapy:  Heparin level 0.3-0.7 units/ml Monitor platelets by anticoagulation protocol: Yes   Plan:  Increase heparin gtt slightly to 900 units/hr Monitor daily HL, CBC, s/s of bleed  Bretta Fees J 01/23/2015,12:23 PM

## 2015-01-23 NOTE — Progress Notes (Signed)
eLink Physician-Brief Progress Note Patient Name: Everest Brod DOB: 07/26/1961 MRN: [id number]   Date of Service  01/23/2015  HPI/Events of Note  Agitated off sedation, post rewarming, follows commands  eICU Interventions  Propofol + fent gtt Aim for SBts with goal extubation in am     Intervention Category Major Interventions: Delirium, psychosis, severe agitation - evaluation and management  ALVA,RAKESH V. 01/23/2015, 3:01 AM

## 2015-01-24 ENCOUNTER — Inpatient Hospital Stay (HOSPITAL_COMMUNITY): Payer: Self-pay

## 2015-01-24 DIAGNOSIS — I255 Ischemic cardiomyopathy: Secondary | ICD-10-CM

## 2015-01-24 DIAGNOSIS — J690 Pneumonitis due to inhalation of food and vomit: Secondary | ICD-10-CM | POA: Insufficient documentation

## 2015-01-24 LAB — CBC WITH DIFFERENTIAL/PLATELET
Basophils Absolute: 0 10*3/uL (ref 0.0–0.1)
Basophils Relative: 0 % (ref 0–1)
EOS ABS: 0 10*3/uL (ref 0.0–0.7)
EOS PCT: 0 % (ref 0–5)
HCT: 31.7 % — ABNORMAL LOW (ref 39.0–52.0)
Hemoglobin: 11.1 g/dL — ABNORMAL LOW (ref 13.0–17.0)
Lymphocytes Relative: 10 % — ABNORMAL LOW (ref 12–46)
Lymphs Abs: 1.9 10*3/uL (ref 0.7–4.0)
MCH: 28.2 pg (ref 26.0–34.0)
MCHC: 35 g/dL (ref 30.0–36.0)
MCV: 80.7 fL (ref 78.0–100.0)
Monocytes Absolute: 1.3 10*3/uL — ABNORMAL HIGH (ref 0.1–1.0)
Monocytes Relative: 7 % (ref 3–12)
NEUTROS ABS: 15.5 10*3/uL — AB (ref 1.7–7.7)
Neutrophils Relative %: 82 % — ABNORMAL HIGH (ref 43–77)
PLATELETS: 95 10*3/uL — AB (ref 150–400)
RBC: 3.93 MIL/uL — ABNORMAL LOW (ref 4.22–5.81)
RDW: 13.7 % (ref 11.5–15.5)
WBC: 18.8 10*3/uL — ABNORMAL HIGH (ref 4.0–10.5)

## 2015-01-24 LAB — BASIC METABOLIC PANEL
Anion gap: 6 (ref 5–15)
BUN: 8 mg/dL (ref 6–20)
CHLORIDE: 101 mmol/L (ref 101–111)
CO2: 24 mmol/L (ref 22–32)
Calcium: 8.1 mg/dL — ABNORMAL LOW (ref 8.9–10.3)
Creatinine, Ser: 0.65 mg/dL (ref 0.61–1.24)
GFR calc non Af Amer: >60 mL/min (ref >60)
GLUCOSE: 95 mg/dL (ref 65–99)
Potassium: 3.6 mmol/L (ref 3.5–5.1)
Sodium: 131 mmol/L — ABNORMAL LOW (ref 135–145)

## 2015-01-24 LAB — HEPARIN LEVEL (UNFRACTIONATED): HEPARIN UNFRACTIONATED: 0.26 [IU]/mL — AB (ref 0.30–0.70)

## 2015-01-24 LAB — GLUCOSE, CAPILLARY
GLUCOSE-CAPILLARY: 111 mg/dL — AB (ref 65–99)
Glucose-Capillary: 99 mg/dL (ref 65–99)
Glucose-Capillary: 111 mg/dL — ABNORMAL HIGH (ref 65–99)

## 2015-01-24 MED ORDER — METOPROLOL TARTRATE 12.5 MG HALF TABLET
12.5000 mg | ORAL_TABLET | Freq: Two times a day (BID) | ORAL | Status: DC
  Administered 2015-01-24 (×2): 12.5 mg via ORAL
  Filled 2015-01-24 (×4): qty 1

## 2015-01-24 MED ORDER — ACETAMINOPHEN 325 MG PO TABS
650.0000 mg | ORAL_TABLET | Freq: Four times a day (QID) | ORAL | Status: DC | PRN
Start: 2015-01-24 — End: 2015-01-25
  Administered 2015-01-24: 650 mg via ORAL
  Filled 2015-01-24: qty 2

## 2015-01-24 MED ORDER — NITROGLYCERIN 0.4 MG SL SUBL
SUBLINGUAL_TABLET | SUBLINGUAL | Status: AC
  Filled 2015-01-24: qty 1

## 2015-01-24 MED ORDER — HALOPERIDOL LACTATE 5 MG/ML IJ SOLN
5.0000 mg | Freq: Four times a day (QID) | INTRAMUSCULAR | Status: DC | PRN
  Filled 2015-01-24 (×2): qty 1

## 2015-01-24 MED ORDER — ATORVASTATIN CALCIUM 80 MG PO TABS
80.0000 mg | ORAL_TABLET | Freq: Every day | ORAL | Status: DC
  Administered 2015-01-24: 80 mg via ORAL
  Filled 2015-01-24 (×2): qty 1

## 2015-01-24 MED ORDER — NITROGLYCERIN 0.4 MG SL SUBL
SUBLINGUAL_TABLET | SUBLINGUAL | Status: AC
  Administered 2015-01-24: 0.4 mg
  Filled 2015-01-24: qty 1

## 2015-01-24 MED ORDER — ZOLPIDEM TARTRATE 5 MG PO TABS
5.0000 mg | ORAL_TABLET | Freq: Once | ORAL | Status: AC
  Administered 2015-01-24: 5 mg via ORAL
  Filled 2015-01-24: qty 1

## 2015-01-24 NOTE — Evaluation (Signed)
Physical Therapy Evaluation Patient Details Name: [name] [name] MRN: [id number] DOB: 05/10/1961 Today's Date: 01/24/2015   History of Present Illness  Patient without know cardiac hx s/p out of hospital VF arrest on 01/21/15 without bystander CPR. He reportedly received 10rounds of Epi and multiple shocks with estimated 30-45 min to ROSC. He was admitted to [facility] and cooling protocol initiated on 01/21/15. Extubated 5/28; On Pt eval pt showing behaviors consistent with confused-agitated level of cognition; CT report stating "the possibility of bilateral middle cerebral artery thrombosis must be of concern" -- also perhaps a period of brain anoxia during CPR?  Clinical Impression   Pt admitted with above diagnosis. Pt currently with functional limitations due to the deficits listed below (see PT Problem List).  Pt will benefit from skilled PT to increase their independence and safety with mobility to allow discharge to the venue listed below.    Will require further assessment of balance and gait, as well as problem-solving, when he is better able to participate; Unfortunately, given his impulsive and agitated behaviors, unable to get a good read of his vitals response to activity; did note that his BP was quite elevated a few miniutes after session in bed.     Follow Up Recommendations Other (comment) (remains to be determined, depending on progress)    Equipment Recommendations  Other (comment) (Will continue to follow and update with recs prn)    Recommendations for Other Services OT consult;Speech consult (for cognition and ADLs)     Precautions / Restrictions Precautions Precautions: Fall Precaution Comments: Careful watch of lines and leads in session (Pt became quite agitated during PT eval) Restrictions Weight Bearing Restrictions: No      Mobility  Bed Mobility Overal bed mobility: Needs Assistance Bed Mobility: Supine to Sit;Sit to Supine     Supine to sit: Min  guard Sit to supine: Min guard   General bed mobility comments: Pt moving overall well, though with grimace, likely related to CPR/musculoskeletal chest pain; Assist needed for safety, lines and leads  Transfers Overall transfer level: Needs assistance Equipment used: 1 person hand held assist;None Transfers: Sit to/from [facility] Sit to Stand: Min guard Stand pivot transfers: Min guard       General transfer comment: Pt showing enough power to stand without physical assist; general unsteadiness initially; Marked decr safety, moving without respect to lines and leads  Ambulation/Gait Ambulation/Gait assistance: Min guard Ambulation Distance (Feet): 5 Feet Assistive device: None       General Gait Details: Able to take steps, however displaying quite agitated behaviors, pushing staff, unsafe, wiothout regard for safety with lines, etc.  Stairs            Wheelchair Mobility    Modified Rankin (Stroke Patients Only)       Balance                                             Pertinent Vitals/Pain Pain Assessment:  (mostly reports of nausea)    Home Living Family/patient expects to be discharged to:: Private residence Living Arrangements: Spouse/significant other Available Help at Discharge: Family;Other (Comment) (Will need more info re: how much assist is available) Type of Home: House Home Access: Stairs to enter   Entrance Stairs-Number of Steps:  (needs to be determined) Home Layout: One level Home Equipment: None      Prior  Function Level of Independence: Independent               Hand Dominance        Extremity/Trunk Assessment   Upper Extremity Assessment: Overall WFL for tasks assessed (though with chest pain related to CPR)           Lower Extremity Assessment: Overall WFL for tasks assessed;Difficult to assess due to impaired cognition         Communication   Communication: Other  (comment);Receptive difficulties (Agitated; not sure how much information he took in)  Cognition Arousal/Alertness: Awake/alert Behavior During Therapy: Agitated;Impulsive Overall Cognitive Status: Impaired/Different from baseline Area of Impairment: Attention;Memory;Safety/judgement;Awareness;Problem solving   Current Attention Level: Sustained Memory: Decreased short-term memory   Safety/Judgement: Decreased awareness of safety   Problem Solving: Difficulty sequencing General Comments: Impulsively standing and removing nasal cannula; Once in chair, pt became nauseated, then quite agitated, moving quickly back to bed and sitting on various lines/leads, including a-line; Unfotunately, the request that he stand to get lines organized triggered more agitation, and he impulsively stood, pushed at staff to get to chair without regard for lines    General Comments General comments (skin integrity, edema, etc.): Will require further assessment of balance and gait, as well as problem-solving, as he is better able to participate; Unfortunately, given his impulsive and agitated behaviors, unable to get a good read of his vitals response to activity; did note that his BP was quite elevated a few miniutes after session in bed    Exercises        Assessment/Plan    PT Assessment Patient needs continued PT services  PT Diagnosis Acute pain;Difficulty walking   PT Problem List Decreased range of motion;Decreased activity tolerance;Decreased balance;Decreased mobility;Decreased cognition;Decreased knowledge of use of DME;Decreased safety awareness;Decreased knowledge of precautions;Pain  PT Treatment Interventions DME instruction;Gait training;Stair training;Functional mobility training;Therapeutic activities;Therapeutic exercise;Balance training;Cognitive remediation;Patient/family education   PT Goals (Current goals can be found in the Care Plan section) Acute Rehab PT Goals Patient Stated Goal: did  not state PT Goal Formulation: With family Time For Goal Achievement: 02/07/15 Potential to Achieve Goals: Fair Additional Goals Additional Goal #1: Pt will progress to displaying  behaviors more consistent with confused appropriate level of cognition    Frequency Min 3X/week   Barriers to discharge   Will need more info re: available assist at home    Co-evaluation               End of Session Equipment Utilized During Treatment: Oxygen (multiple staff assist appreciated!) Activity Tolerance: Treatment limited secondary to agitation Patient left: in bed;with call bell/phone within reach;with nursing/sitter in room Nurse Communication: Mobility status         Time: 1914-7829 PT Time Calculation (min) (ACUTE ONLY): 14 min   Charges:   PT Evaluation $Initial PT Evaluation Tier I: 1 Procedure     PT G CodesQuin Hoop 01/24/2015, 10:33 AM  Roney Marion, Ali Chukson Pager [phone] Office [phone]

## 2015-01-24 NOTE — Progress Notes (Signed)
Speech Language Pathology Treatment: Dysphagia  Patient Details Name: [name] [name] MRN: [id number] DOB: Feb 25, 1961 Today's Date: 01/24/2015 Time: 1884-1660 SLP Time Calculation (min) (ACUTE ONLY): 8 min  Assessment / Plan / Recommendation Clinical Impression  Pt demonstrates impulsivity and briefly sustained attention impacting ability to participate with SLP. Several trials of thin liquids provided with no sign of aspiration, though pt could not encouraged to consume solids. Per eval note, pt able to masticate regualr soldis. He did not eat breakfast, though suspect this may be due to cognitive impairment and inability to sustain attention to function tasks. Will sign off for swallow, pt could benefit from SLP cognitive linguistic eval given concern for anoxic brain injury following prolonged down time.     HPI     Pertinent Vitals Pain Assessment:  (mostly reports of nausea)  SLP Plan  All goals met    Recommendations Diet recommendations: Regular;Thin liquid Medication Administration: Whole meds with liquid Supervision: Full supervision/cueing for compensatory strategies Compensations: Small sips/bites;Slow rate;Follow solids with liquid              Oral Care Recommendations: Oral care BID Plan: All goals met    GO    [name] Baltimore, MA CCC-SLP [phone]  [name] [name] 01/24/2015, 11:16 AM

## 2015-01-24 NOTE — Progress Notes (Signed)
[facility] Progress Note Patient Name: [name] [name] DOB: [birth year] MRN: [id number]   Date of Service  01/24/2015  HPI/Events of Note  Patient requests sleeping aide.   eICU Interventions  Will order Ambien 5 mg PO at HS X 1.      Intervention Category Minor Interventions: Routine modifications to care plan (e.g. PRN medications for pain, fever)  Sommer,Steven Eugene 01/24/2015, 9:37 PM

## 2015-01-24 NOTE — Progress Notes (Signed)
[facility] Progress Note  Subjective:  Patient without know cardiac hx s/p out of hospital VF arrest on 01/21/15 without bystander CPR. He reportedly received 10rounds of Epi and multiple shocks with estimated 30-45 min to ROSC.  He was admitted to [facility] and cooling protocol initiated on 01/21/15.  He was successfully extubated on 01/23/15.  No acute events overnight. He was seen and examined this morning.  He has chest wall tenderness.  He attempted to eat some breakfast this AM.  Note:  Patient's chart flagged for potential chart merger.  Med hx includes HCV and tobacco abuse.  Objective: Vital signs in last 24 hours: Filed Vitals:   01/24/15 0300 01/24/15 0400 01/24/15 0500 01/24/15 0600  BP:      Pulse:      Temp:      TempSrc:      Resp:      Height:      Weight:      SpO2: 95% 95% 93% 92%   Weight change:   Intake/Output Summary (Last 24 hours) at 01/24/15 0808 Last data filed at 01/24/15 0436  Gross per 24 hour  Intake 1374.54 ml  Output   2610 ml  Net -1235.46 ml   General: resting in bed in NAD HEENT: [place]/AT Cardiac: RRR, no rubs, murmurs or gallops; cental chest TTP; 2+ radial and PT B/L, 1+ DP on right, 2+ DP on left Pulm: clear to auscultation bilaterally, moving normal volumes of air Abd: soft, nontender, nondistended, BS present Ext: warm and well perfused, no pedal edema Neuro: alert and oriented X3, moving extremities spontaneously, responding appropriately, following commands  Telemetry:  NSR 70s . sodium chloride  2,000 mL Intravenous Once  . ampicillin-sulbactam (UNASYN) IV  3 g Intravenous Q6H  . antiseptic oral rinse  7 mL Mouth Rinse q12n4p  . aspirin  81 mg Oral Daily  . chlorhexidine  15 mL Mouth Rinse BID  . cisatracurium  0.1 mg/kg Intravenous Once  . fentaNYL (SUBLIMAZE) injection  100 mcg Intravenous Once  . insulin aspart  0-5 Units Subcutaneous QHS  . insulin aspart  0-9 Units Subcutaneous TID WC  . midazolam  2 mg Intravenous Once   . pantoprazole (PROTONIX) IV  40 mg Intravenous QHS   Lab Results: Basic Metabolic Panel:  Recent Labs Lab 01/22/15 0820  01/23/15 0335 01/24/15 0400  NA 137  < > 137 131*  K 3.4*  < > 4.3 3.6  CL 112*  < > 109 101  CO2 15*  < > 17* 24  GLUCOSE 124*  < > 135* 95  BUN 12  < > 15 8  CREATININE 0.59*  < > 0.97 0.65  CALCIUM 8.1*  < > 8.0* 8.1*  MG 1.8  --   --   --   < > = values in this interval not displayed. CBC:  Recent Labs Lab 01/23/15 0335 01/24/15 0400  WBC 23.0* 18.8*  NEUTROABS  --  15.5*  HGB 14.5 11.1*  HCT 41.3 31.7*  MCV 79.9 80.7  PLT 145* 95*   Cardiac Enzymes:  Recent Labs Lab 01/21/15 1452 01/21/15 1709 01/22/15 0233  TROPONINI 4.71* 6.24* 3.44*   CBG:  Recent Labs Lab 01/22/15 2342 01/23/15 0338 01/23/15 0803 01/23/15 1146 01/23/15 1550 01/23/15 2118  GLUCAP 101* 126* 139* 134* 105* 87   Coagulation:  Recent Labs Lab 01/21/15 0830 01/21/15 1642  LABPROT 17.3* 16.4*  INR 1.40 1.31   Urine Drug Screen: Drugs of Abuse  Component Value Date/Time   LABOPIA NONE DETECTED 01/21/2015 0915   COCAINSCRNUR NONE DETECTED 01/21/2015 0915   LABBENZ POSITIVE* 01/21/2015 0915   AMPHETMU NONE DETECTED 01/21/2015 0915   THCU POSITIVE* 01/21/2015 0915   LABBARB NONE DETECTED 01/21/2015 0915    Studies/Results: Ct Head Wo Contrast  01/21/2015   ADDENDUM REPORT: 01/21/2015 11:20  ADDENDUM: These results were called by telephone at the time of interpretation on 01/21/2015 at 11:19 am to Dr. Gwendolyn Lima, who verbally acknowledged these results.   Electronically Signed   By: Lowella Grip III M.D.   On: 01/21/2015 11:20   01/21/2015   CLINICAL DATA:  Status post cardiac arrest  EXAM: CT HEAD WITHOUT CONTRAST  TECHNIQUE: Contiguous axial images were obtained from the base of the skull through the vertex without intravenous contrast.  COMPARISON:  None.  FINDINGS: The ventricles are normal in size and configuration. There is no intracranial  mass, hemorrhage, extra-axial fluid collection, or midline shift. No focal gray-white compartment lesions are identified.  Both middle cerebral arteries show symmetric increased attenuation. Bony calvarium appears intact. The mastoid air cells are clear.  IMPRESSION: Increased attenuation is noted in both middle cerebral arteries. The significance of this symmetric finding is uncertain. Given the history, the possibility of bilateral middle cerebral artery thrombosis must be of concern. This finding may well warrant CT or MR angiography to further assess. There is no intracranial edema. Gray-white compartments appear normal. No acute hemorrhage evident.  Message left for Dr. Gwendolyn Lima with respect to this report, Jan 21, 2015, 10:56 a.m.  Electronically Signed: By: Lowella Grip III M.D. On: 01/21/2015 10:56   Dg Chest Port 1 View  01/22/2015   CLINICAL DATA:  Acute respiratory failure with hypoxia  EXAM: PORTABLE CHEST - 1 VIEW  COMPARISON:  01/21/2015  FINDINGS: Support devices remain in stable position. Interval improvement in aeration with resolution of bilateral airspace opacities. No confluent opacities. Heart is normal size. No effusions or pneumothorax. No acute bony abnormality.  IMPRESSION: Interval resolution of bilateral airspace disease. Support devices stable.   Electronically Signed   By: Rolm Baptise M.D.   On:   01/21/15 Limited 2D ECHO: EF- 35%,  Apex and lateral walls move a little better than the other walls. The inferior septum is dyskinetic. The cavity size was normal. Wall thickness was increased in a pattern of mild LVH. The estimated ejection fraction was 35%. - Right ventricle: The cavity size was normal. Systolic function was mildly reduced.  01/21/15 EEG: Clinical Interpretation: This EEG is essentially a normal drowsy and sleep EEG though cannot rule out encephalopathy with this study. There was no seizure or seizure predisposition recorded on this study.    01/22/15 2D ECHO (complete): Study Conclusions  - Procedure narrative: Transthoracic echocardiography. Image quality was adequate. The study was technically difficult. Intravenous contrast (Definity) was administered. - Left ventricle: Wall motion is decreased since study of 01/21/2015. Apex and lateral walls move a little better than the other walls. The inferior septum is dyskinetic. The cavity size was normal. Wall thickness was increased in a pattern of mild LVH. The estimated ejection fraction was 35%. - Right ventricle: The cavity size was normal. Systolic function was mildly reduced.  Medications: I have reviewed the patient's current medications. Scheduled Meds: . sodium chloride  2,000 mL Intravenous Once  . ampicillin-sulbactam (UNASYN) IV  3 g Intravenous Q6H  . antiseptic oral rinse  7 mL Mouth Rinse q12n4p  . aspirin  81 mg  Oral Daily  . chlorhexidine  15 mL Mouth Rinse BID  . cisatracurium  0.1 mg/kg Intravenous Once  . fentaNYL (SUBLIMAZE) injection  100 mcg Intravenous Once  . insulin aspart  0-5 Units Subcutaneous QHS  . insulin aspart  0-9 Units Subcutaneous TID WC  . midazolam  2 mg Intravenous Once  . pantoprazole (PROTONIX) IV  40 mg Intravenous QHS   Continuous Infusions: . sodium chloride Stopped (01/23/15 1900)  . sodium chloride Stopped (01/22/15 1815)  . heparin 1,100 Units/hr (01/24/15 0654)  . norepinephrine (LEVOPHED) Adult infusion    . propofol (DIPRIVAN) infusion Stopped (01/23/15 0730)   PRN Meds:.etomidate, fentaNYL (SUBLIMAZE) injection, labetalol, ondansetron (ZOFRAN) IV, rocuronium   Assessment/Plan:   VF arrest: Patient neurologic status appears to be intact despite initial poor prognosis given prolonged CPR- ~74mins, lactic acidosis, decorticate posturing on admission.  Trop 0.853-->4.71-->6.24-->3.44 and EKG not consistent with ACS.  2D ECHO was technically difficult, reveals increased LV wall motion (compared to 05/26  limited ECHO), dyskinetic inferior septum, mild LVH, EF 35% (full conclusion above).  Chest wall pain likely due to compressions. - continue heparin gtt and ASA - add low dose BB and lipitor today - LHC planned for 01/26/15; depending on results may need to contact EP for ICD vs temporary LifeVest  Cardiomyopathy:  EF 35%, inferior septum dyskinetic.  - LHC planned for 01/26/15; depending on results may need to contact EP for ICD vs temporary LifeVest - continue ASA  - adding BB and statin as above - plan to start ACEI after Cath  Acute encephalopathy: Resolving. CT revealed increased attenuation in both MCAs concerning for B/L MCA thrombosis however this is unlikely given remarkable neurologic recovery. - per primary  Leukocytosis:  WBC improving (down to 18.8).  On Unasyn for possible aspiration PNA.  AM CXR pending. - continue to monitor CBC - per primary  Thrombocytopenia:  Plt 177K on 05/26, now 95K.  4T score for HIT is 2 which indicates a low probability (<5%) of HIT. - continue to monitor CBC and for signs of bleeding - per primary    LOS: 3 days   Duwaine Maxin DO IMTS PGY2 on CCU Service 01/24/2015, 8:08 AM   The patient was seen, examined and discussed with Duwaine Maxin DO and I agree with the above.   54 year old male, smoker who is s/p SCD at home with prolonged resuscitation, s/p hypothermia protocol, who neurologically recovered, plan to extubate yesterday with full neurologic recovery. NSTEMI peak troponin 6.24, now downtrending, echo shows LVEF 35% with regional wall motion abnormalities. We will plan for a left heart cath on Tuesday. Currently on ASA, heparin drip, we will add low dose metorprolol 12.5 mg po BID and atorvastatin 80 mg po daily. We will add lipitor once he is extubated.  Dorothy Spark 01/24/2015

## 2015-01-24 NOTE — Progress Notes (Addendum)
[name] [name] f or Heparin Indication: NSTEMI  No Known Allergies  Patient Measurements: Height: 5\' 9"  (175.3 cm) Weight: 151 lb 0.2 oz (68.5 kg) IBW/kg (Calculated) : 70.7 Heparin Dosing Weight: 68 kg  Vital Signs: Temp: 97.7 F (36.5 C) (05/28 2329) Temp Source: Oral (05/28 2329)  Labs:  Recent Labs  01/21/15 0830  01/21/15 1452  01/21/15 1642  01/21/15 1709  01/22/15 0233 01/22/15 0410  01/22/15 2345 01/23/15 0335 01/23/15 1145 01/24/15 0400  HGB 15.8  < >  --   --   --   < >  --   --   --  17.1*  --   --  14.5  --  11.1*  HCT 47.3  < >  --   --   --   < >  --   --   --  48.9  --   --  41.3  --  31.7*  PLT 177  --   --   --   --   --   --   --   --  172  --   --  145*  --  PENDING  APTT 50*  --   --   --  101*  --   --   --   --   --   --   --   --   --   --   LABPROT 17.3*  --   --   --  16.4*  --   --   --   --   --   --   --   --   --   --   INR 1.40  --   --   --  1.31  --   --   --   --   --   --   --   --   --   --   HEPARINUNFRC  --   --   --   < > 0.24*  --   --   --   --  0.40  < >  --  0.33 0.31 <0.10*  CREATININE 1.48*  < >  --   --   --   < >  --   < >  --  0.70  < > 0.90 0.97  --  0.65  TROPONINI 0.53*  --  4.71*  --   --   --  6.24*  --  3.44*  --   --   --   --   --   --   < > = values in this interval not displayed.  Estimated Creatinine Clearance: 105.8 mL/min (by C-G formula based on Cr of 0.65).   Assessment: 54 y.o. male with NSTEMI s/p cardiac arrest/hypothermia, awaiting cath, for heparin  Goal of Therapy:  Heparin level 0.3-0.7 units/ml Monitor platelets by anticoagulation protocol: Yes   Plan:  Increase Heparin 1100 units/hr Check heparin level in 8 hours  [name], [name] Curb 01/24/2015,6:45 AM  ADDENDUM:  HL came back still subtherapeutic at 0.26. Hgb down to 11.1, plts 95 (was 172 on 5/26) No issues per nurse.  Plan: Increase heparin gtt to 1250 units/hr Check HL at 2130 Monitor daily HL,  CBC, s/s of bleed  ADDENDUM:  Heparin stopped per MD

## 2015-01-24 NOTE — Progress Notes (Signed)
PULMONARY / CRITICAL CARE MEDICINE   Name: [name] [name] MRN: [id number] DOB: 30-Aug-1960    ADMISSION DATE:  01/21/2015 CONSULTATION DATE:  5/26  REFERRING MD :  Mingo Amber  CHIEF COMPLAINT:  Post arrest   INITIAL PRESENTATION:  54 year old male admitted to [facility] 5/26 s/p VF arrest. ROSC estimated at > 45 minutes.   STUDIES:  ECHO 5/26: LVEF grossly normal, mild anteroseptal hypokinesis, RV size/function normal CT head 5/26>>> possible bilateral MCA obstruction, no edema, normal Gray-white junction Echo 5/27 : LVEF 35%, inferior septum dyskinetic, RV function mildly reduced  SIGNIFICANT EVENTS: 5/26: initiated cooling  5/28 re-warmed, interactive and following commands  SUBJECTIVE: extubated, angry with staff, combative, swearing  VITAL SIGNS: Temp:  [96.6 F (35.9 C)-98 F (36.7 C)] 98 F (36.7 C) (05/29 0800) Resp:  [15-23] 20 (05/28 2300) BP: (142-171)/(82-103) 142/97 mmHg (05/28 1600) SpO2:  [92 %-100 %] 97 % (05/29 0900) Arterial Line BP: (123-191)/(67-111) 166/101 mmHg (05/29 0900) Weight:  [68.5 kg (151 lb 0.2 oz)] 68.5 kg (151 lb 0.2 oz) (05/29 0100) HEMODYNAMICS: CVP:  [6 mmHg] 6 mmHg VENTILATOR SETTINGS:   INTAKE / OUTPUT:  Intake/Output Summary (Last 24 hours) at 01/24/15 0950 Last data filed at 01/24/15 0900  Gross per 24 hour  Intake 1432.24 ml  Output   3710 ml  Net -2277.76 ml    PHYSICAL EXAMINATION:  Gen: awake, walking around room throwing things and spitting at staff HENT: NCAT  PULM: CTA B,  CV: RRR, no mgr GI: BS+, soft, nontender Derm: no cyanosis or rash Neuro: A&Ox4, maew Psyche: angry, combative  LABS:  CBC  Recent Labs Lab 01/22/15 0410 01/23/15 0335 01/24/15 0400  WBC 24.5* 23.0* 18.8*  HGB 17.1* 14.5 11.1*  HCT 48.9 41.3 31.7*  PLT 172 145* 95*   Coag's  Recent Labs Lab 01/21/15 0830 01/21/15 1642  APTT 50* 101*  INR 1.40 1.31   BMET  Recent Labs Lab 01/22/15 2345 01/23/15 0335 01/24/15 0400  NA 137 137  131*  K 5.1 4.3 3.6  CL 111 109 101  CO2 18* 17* 24  BUN 13 15 8   CREATININE 0.90 0.97 0.65  GLUCOSE 107* 135* 95   Electrolytes  Recent Labs Lab 01/22/15 0820  01/22/15 2345 01/23/15 0335 01/24/15 0400  CALCIUM 8.1*  < > 8.2* 8.0* 8.1*  MG 1.8  --   --   --   --   < > = values in this interval not displayed. Sepsis Markers  Recent Labs Lab 01/21/15 0830 01/21/15 0839 01/22/15 0410 01/23/15 0335 01/23/15 0851  LATICACIDVEN  --  10.65*  --   --  2.3*  PROCALCITON <0.10  --  3.14 2.28  --    ABG  Recent Labs Lab 01/21/15 0853  PHART 7.132*  PCO2ART 43.1  PO2ART 324.0*   Liver Enzymes No results for input(s): AST, ALT, ALKPHOS, BILITOT, ALBUMIN in the last 168 hours. Cardiac Enzymes  Recent Labs Lab 01/21/15 1452 01/21/15 1709 01/22/15 0233  TROPONINI 4.71* 6.24* 3.44*   Glucose  Recent Labs Lab 01/22/15 2342 01/23/15 0338 01/23/15 0803 01/23/15 1146 01/23/15 1550 01/23/15 2118  GLUCAP 101* 126* 139* 134* 105* 87    Imaging CXR reviewed 5/29 >> persistent infiltrates, improving however  ASSESSMENT / PLAN:  NEUROLOGIC A:   Acute encephalopathy/COMA  s/p cardiac arrest> resolved Agitation, anger, unclear if combative, does not appear to be withdrawing QTc OK for haldol Exam not consistent with bilateral MCA thrombosis  P:   PRN  haldol Have asked family to assist with patient's combativeness   PULMONARY OETT 5/26>>> A: Acute pulmonary edema resolved Aspiration pneumonitis P:   Incentive spirometry Monitor O2 saturation  CARDIOVASCULAR CVL right IJ CVL 5/26>>> A:  VF arrest  Cardiomyopathy by echo 5/28 NSTEMI Afib > resolved Prolonged QTc > resolved P:  Repeat 12 lead EKG daily Continue Heparin gtt and asa Further recs per cards Needs LHC and/or EP study  RENAL A:   Lactic acidosis s/p cardiac arrest > presumably resolved Non-anion gap acidosis P:   Repeat lactic acid now Monitor BMET and UOP Replace electrolytes  as needed  GASTROINTESTINAL A:   No acute issues P:   Diet as tolerated D/c PPI  HEMATOLOGIC A:   No acute issues P:  Trend CBC Transfuse per ICU protocol   INFECTIOUS A:   Aspiration pneumonitia P:    Sputum 5/26>>>  unasyn 5/27 >>> plan 5-7 days   ENDOCRINE A:   No acute   P:   Trend glucose    FAMILY  - Updates: 5/29 bedside by me  - Inter-disciplinary family meet or Palliative Care meeting due by: 6/2  Roselie Awkward, MD [facility] Pager: [phone] Cell: [phone] After 3pm or if no response, call [phone]

## 2015-01-25 ENCOUNTER — Inpatient Hospital Stay (HOSPITAL_COMMUNITY)
Admission: AD | Admit: 2015-01-25 | Discharge: 2015-01-26 | Discharge status: Against Medical Advice | Payer: Self-pay | Source: Ambulatory Visit | Attending: Critical Care Medicine | Admitting: Critical Care Medicine

## 2015-01-25 ENCOUNTER — Inpatient Hospital Stay (HOSPITAL_COMMUNITY): Admission: RE | Admit: 2015-01-25 | Payer: Self-pay | Source: Ambulatory Visit

## 2015-01-25 ENCOUNTER — Inpatient Hospital Stay (HOSPITAL_COMMUNITY): Payer: Self-pay

## 2015-01-25 DIAGNOSIS — Z5329 Procedure and treatment not carried out because of patient's decision for other reasons: Secondary | ICD-10-CM | POA: Diagnosis present

## 2015-01-25 DIAGNOSIS — R451 Restlessness and agitation: Secondary | ICD-10-CM | POA: Diagnosis not present

## 2015-01-25 DIAGNOSIS — I429 Cardiomyopathy, unspecified: Secondary | ICD-10-CM

## 2015-01-25 DIAGNOSIS — E785 Hyperlipidemia, unspecified: Secondary | ICD-10-CM

## 2015-01-25 DIAGNOSIS — I255 Ischemic cardiomyopathy: Secondary | ICD-10-CM | POA: Diagnosis present

## 2015-01-25 DIAGNOSIS — I469 Cardiac arrest, cause unspecified: Secondary | ICD-10-CM | POA: Diagnosis present

## 2015-01-25 DIAGNOSIS — I214 Non-ST elevation (NSTEMI) myocardial infarction: Principal | ICD-10-CM | POA: Diagnosis present

## 2015-01-25 DIAGNOSIS — J189 Pneumonia, unspecified organism: Secondary | ICD-10-CM

## 2015-01-25 DIAGNOSIS — I4901 Ventricular fibrillation: Secondary | ICD-10-CM

## 2015-01-25 DIAGNOSIS — R4182 Altered mental status, unspecified: Secondary | ICD-10-CM | POA: Diagnosis present

## 2015-01-25 DIAGNOSIS — Z5320 Procedure and treatment not carried out because of patient's decision for unspecified reasons: Secondary | ICD-10-CM

## 2015-01-25 DIAGNOSIS — I213 ST elevation (STEMI) myocardial infarction of unspecified site: Secondary | ICD-10-CM | POA: Insufficient documentation

## 2015-01-25 DIAGNOSIS — R0789 Other chest pain: Secondary | ICD-10-CM | POA: Diagnosis present

## 2015-01-25 HISTORY — DX: Cardiac arrest, cause unspecified: I46.9

## 2015-01-25 LAB — CBC WITH DIFFERENTIAL/PLATELET
BASOS ABS: 0 10*3/uL (ref 0.0–0.1)
Basophils Relative: 0 % (ref 0–1)
EOS ABS: 0 10*3/uL (ref 0.0–0.7)
Eosinophils Relative: 0 % (ref 0–5)
HEMATOCRIT: 34 % — AB (ref 39.0–52.0)
Hemoglobin: 11.6 g/dL — ABNORMAL LOW (ref 13.0–17.0)
LYMPHS ABS: 2.1 10*3/uL (ref 0.7–4.0)
Lymphocytes Relative: 16 % (ref 12–46)
MCH: 27.6 pg (ref 26.0–34.0)
MCHC: 34.1 g/dL (ref 30.0–36.0)
MCV: 81 fL (ref 78.0–100.0)
MONO ABS: 1.6 10*3/uL — AB (ref 0.1–1.0)
Monocytes Relative: 12 % (ref 3–12)
NEUTROS ABS: 9.5 10*3/uL — AB (ref 1.7–7.7)
NEUTROS PCT: 72 % (ref 43–77)
Platelets: 134 10*3/uL — ABNORMAL LOW (ref 150–400)
RBC: 4.2 MIL/uL — AB (ref 4.22–5.81)
RDW: 13.4 % (ref 11.5–15.5)
WBC: 13.2 10*3/uL — ABNORMAL HIGH (ref 4.0–10.5)

## 2015-01-25 LAB — CBC
HEMATOCRIT: 33 % — AB (ref 39.0–52.0)
HEMOGLOBIN: 11.3 g/dL — AB (ref 13.0–17.0)
MCH: 27.5 pg (ref 26.0–34.0)
MCHC: 34.2 g/dL (ref 30.0–36.0)
MCV: 80.3 fL (ref 78.0–100.0)
Platelets: 125 10*3/uL — ABNORMAL LOW (ref 150–400)
RBC: 4.11 MIL/uL — AB (ref 4.22–5.81)
RDW: 13.4 % (ref 11.5–15.5)
WBC: 16.1 10*3/uL — ABNORMAL HIGH (ref 4.0–10.5)

## 2015-01-25 LAB — COMPREHENSIVE METABOLIC PANEL
ALT: 136 U/L — ABNORMAL HIGH (ref 17–63)
ANION GAP: 4 — AB (ref 5–15)
AST: 396 U/L — ABNORMAL HIGH (ref 15–41)
Albumin: 3 g/dL — ABNORMAL LOW (ref 3.5–5.0)
Alkaline Phosphatase: 64 U/L (ref 38–126)
BUN: 8 mg/dL (ref 6–20)
CO2: 28 mmol/L (ref 22–32)
Calcium: 8.1 mg/dL — ABNORMAL LOW (ref 8.9–10.3)
Chloride: 102 mmol/L (ref 101–111)
Creatinine, Ser: 0.76 mg/dL (ref 0.61–1.24)
GFR calc Af Amer: >60 mL/min (ref >60)
GFR calc non Af Amer: >60 mL/min (ref >60)
Glucose, Bld: 121 mg/dL — ABNORMAL HIGH (ref 65–99)
POTASSIUM: 3.3 mmol/L — AB (ref 3.5–5.1)
Sodium: 134 mmol/L — ABNORMAL LOW (ref 135–145)
Total Bilirubin: 0.8 mg/dL (ref 0.3–1.2)
Total Protein: 6 g/dL — ABNORMAL LOW (ref 6.5–8.1)

## 2015-01-25 LAB — HEPARIN LEVEL (UNFRACTIONATED): Heparin Unfractionated: 0.17 IU/mL — ABNORMAL LOW (ref 0.30–0.70)

## 2015-01-25 MED ORDER — SODIUM CHLORIDE 0.9 % IJ SOLN
3.0000 mL | Freq: Two times a day (BID) | INTRAMUSCULAR | Status: DC
  Administered 2015-01-25 (×2): 3 mL via INTRAVENOUS

## 2015-01-25 MED ORDER — ONDANSETRON HCL 4 MG PO TABS: 4.0000 mg | ORAL_TABLET | Freq: Four times a day (QID) | ORAL | Status: DC | PRN

## 2015-01-25 MED ORDER — SODIUM CHLORIDE 0.9 % WEIGHT BASED INFUSION
3.0000 mL/kg/h | INTRAVENOUS | Status: DC
  Administered 2015-01-26: 3 mL/kg/h via INTRAVENOUS

## 2015-01-25 MED ORDER — ACETAMINOPHEN 325 MG PO TABS
650.0000 mg | ORAL_TABLET | Freq: Four times a day (QID) | ORAL | Status: DC | PRN
  Administered 2015-01-25 – 2015-01-26 (×2): 650 mg via ORAL
  Filled 2015-01-25 (×2): qty 2

## 2015-01-25 MED ORDER — ACETAMINOPHEN 650 MG RE SUPP: 650.0000 mg | Freq: Four times a day (QID) | RECTAL | Status: DC | PRN

## 2015-01-25 MED ORDER — ASPIRIN EC 81 MG PO TBEC
81.0000 mg | DELAYED_RELEASE_TABLET | Freq: Every day | ORAL | Status: DC
  Administered 2015-01-26: 81 mg via ORAL
  Filled 2015-01-25 (×3): qty 1

## 2015-01-25 MED ORDER — INSULIN ASPART 100 UNIT/ML ~~LOC~~ SOLN
0.0000 [IU] | Freq: Three times a day (TID) | SUBCUTANEOUS | Status: DC
  Administered 2015-01-26: 3 [IU] via SUBCUTANEOUS

## 2015-01-25 MED ORDER — HEPARIN BOLUS VIA INFUSION
4000.0000 [IU] | Freq: Once | INTRAVENOUS | Status: AC
  Administered 2015-01-25: 4000 [IU] via INTRAVENOUS
  Filled 2015-01-25: qty 4000

## 2015-01-25 MED ORDER — SODIUM CHLORIDE 0.9 % IV SOLN: 250.0000 mL | INTRAVENOUS | Status: DC | PRN

## 2015-01-25 MED ORDER — SODIUM CHLORIDE 0.9 % WEIGHT BASED INFUSION: 1.0000 mL/kg/h | INTRAVENOUS | Status: DC

## 2015-01-25 MED ORDER — HEPARIN SODIUM (PORCINE) 5000 UNIT/ML IJ SOLN: 5000.0000 [IU] | Freq: Three times a day (TID) | INTRAMUSCULAR | Status: DC

## 2015-01-25 MED ORDER — SODIUM CHLORIDE 0.9 % IJ SOLN: 3.0000 mL | Freq: Two times a day (BID) | INTRAMUSCULAR | Status: DC

## 2015-01-25 MED ORDER — INSULIN ASPART 100 UNIT/ML ~~LOC~~ SOLN: 0.0000 [IU] | Freq: Every day | SUBCUTANEOUS | Status: DC

## 2015-01-25 MED ORDER — SENNOSIDES-DOCUSATE SODIUM 8.6-50 MG PO TABS: 1.0000 | ORAL_TABLET | Freq: Every evening | ORAL | Status: DC | PRN

## 2015-01-25 MED ORDER — ASPIRIN 81 MG PO CHEW
81.0000 mg | CHEWABLE_TABLET | ORAL | Status: AC
  Administered 2015-01-25: 81 mg via ORAL

## 2015-01-25 MED ORDER — POTASSIUM CHLORIDE 10 MEQ/50ML IV SOLN
10.0000 meq | INTRAVENOUS | Status: AC
  Administered 2015-01-25 – 2015-01-26 (×3): 10 meq via INTRAVENOUS
  Filled 2015-01-25 (×3): qty 50

## 2015-01-25 MED ORDER — HEPARIN (PORCINE) IN NACL 100-0.45 UNIT/ML-% IJ SOLN
1400.0000 [IU]/h | INTRAMUSCULAR | Status: DC
  Administered 2015-01-25 (×2): 1250 [IU]/h via INTRAVENOUS
  Administered 2015-01-26: 1400 [IU]/h via INTRAVENOUS
  Filled 2015-01-25 (×3): qty 250

## 2015-01-25 MED ORDER — SODIUM CHLORIDE 0.9 % IJ SOLN: 3.0000 mL | INTRAMUSCULAR | Status: DC | PRN

## 2015-01-25 MED ORDER — ONDANSETRON HCL 4 MG/2ML IJ SOLN: 4.0000 mg | Freq: Four times a day (QID) | INTRAMUSCULAR | Status: DC | PRN

## 2015-01-25 MED ORDER — METOPROLOL TARTRATE 12.5 MG HALF TABLET
12.5000 mg | ORAL_TABLET | Freq: Two times a day (BID) | ORAL | Status: DC
  Administered 2015-01-25 – 2015-01-26 (×3): 12.5 mg via ORAL
  Filled 2015-01-25 (×5): qty 1

## 2015-01-25 MED ORDER — ATORVASTATIN CALCIUM 80 MG PO TABS
80.0000 mg | ORAL_TABLET | Freq: Every day | ORAL | Status: DC
  Administered 2015-01-25: 80 mg via ORAL
  Filled 2015-01-25 (×2): qty 1

## 2015-01-25 MED ORDER — HALOPERIDOL LACTATE 5 MG/ML IJ SOLN: 5.0000 mg | Freq: Four times a day (QID) | INTRAMUSCULAR | Status: DC | PRN

## 2015-01-25 MED ORDER — AMOXICILLIN-POT CLAVULANATE 875-125 MG PO TABS
1.0000 | ORAL_TABLET | Freq: Two times a day (BID) | ORAL | Status: DC
  Administered 2015-01-25 (×2): 1 via ORAL
  Filled 2015-01-25 (×4): qty 1

## 2015-01-25 NOTE — Progress Notes (Signed)
Came to see patient on AM rounds; per nursing, patient became agitated last PM and this AM. He left the hospital AMA in his gown and with IJ in place early this AM. Nursing tells me they discussed risk of leaving including sudden cardiac death. Family also apparently tried to persuade patient to stay. Security and police called but patient left prior to their arrival. Security and police are searching for him now. Based on history, if found, he should be evaluated by psychiatry to see if he is competent to make his own decisions. He remains high risk for recurrent cardiac events given recent history. Note all events occurred prior to my arrival and I was not on call or involved with any decisions or interactions last PM [place] [facility]

## 2015-01-25 NOTE — Discharge Summary (Signed)
PCCM  This pt became agitated at walked out of the unit at 0640 hours.  On call PCCM MD notified at [phone]. The pt left with R IJ line in place.  [employer] notified .  Pt was brought back to hospital by the family with IV line still in place at 1030AM 01/25/2015.  The pt was off campus and at home for 4 hours.    I will notified [facility] cardiology to see this pt.    We will need to d/c and readmit this pt due to the absence of time off the unit.  Mariel Sleet Beeper  [phone]  Cell  [phone]  If no response or cell goes to voicemail, call beeper [phone]

## 2015-01-25 NOTE — Progress Notes (Signed)
[facility] Progress Note Patient Name: Verlin Uher DOB: July 29, 1961 MRN: [id number]   Date of Service  01/25/2015  HPI/Events of Note  K+ = 3.4 and Creatinine = 0.76.  eICU Interventions  Will replete K+.     Intervention Category Intermediate Interventions: Electrolyte abnormality - evaluation and management  Sommer,Steven Eugene 01/25/2015, 8:11 PM

## 2015-01-25 NOTE — Progress Notes (Addendum)
Patient complaint of chest pain.  Nitro SL given, EKG done and Dr. Emmit Alexanders is made aware.  Patient stated that his chest is sore.  I asked him as to the location and he was holding his sternal area.  Informed him that EMS performed CPR on him and showed him that he has an abrasion to his chest.  He nodded and went to bed.

## 2015-01-25 NOTE — Progress Notes (Addendum)
RN discussed with pt and family the risk of leaving hospital with IJ intact. Discuss risk of sudden cardiac death, air embolism leading to a stroke, infection, and bleeding from central line.  Pt was extremely agitated and aggressive. Pt was disinterested in education toward risk of leaving AMA with IJ intact. He stated, "I don't care, leave me alone."  Family expressed concern for pt and was supportive.    Alessandra Grout, RN

## 2015-01-25 NOTE — Progress Notes (Signed)
0422: RN and NT in room to take vitals/draw AM labs. Patient comfortable and resting but also c/o 5/10 mid chest pain. Fentanyl 25mg  given. Central line dressing changed. Wife sitting with patient on bed and very affectionate with each other. No complaints or concerns. Lights off and patient calm with eyes closed.   1700: Wife ran to nurses station stating patient "was very agitated and I have to leave the room." RN asked what happened for him to get agitated and wife states "he woke up and wants to leave." RN asks nursing staff to page security.  Upon arrival to room, leads off, patient sitting on side of bed stating he "can't have the procedure done because I have a bad head cold." Also states "I will come back for the procedure when I'm over this cold" and that he had to go home to "walk the dogs." Attempted to calm patient down and explain the importance of staying at the hospital because he "is very sick and will not make it back for the procedure if he leaves." RN explained to patient that he cannot leave the hospital with the central line in place and that he needed to sit down until the doctor could come and talk to him.  Attempted to educate on risks associated with leaving AMA with IJ in place and also sudden cardiac death.  Patient verbally aggressive and cursing towards nursing staff and yelling in his wife's face, demanding that she give him the car keys. Wife refuses to give patient keys. Patient walking down the hall and stating "I am leaving." RN continuously tells patient that he cannot leave the hospital. Refusing to sign AMA paperwork. Patient takes a left out of the unit, 2 Central, goes down the stairs and exits the building. 2 RN's and wife outside with patient telling him he cannot leave the hospital. Patient not listening to anyone and continues to walk down the street away from the hospital. Wife runs to go get her car. Security and [employer] outside and obtaining patient  information from nursing staff.   [phone]: On call PCCM MD notified and is aware that the patient has left and still has a central line in place. Security and [employer] for patient.

## 2015-01-25 NOTE — Progress Notes (Addendum)
Family has been in the hospital all day long but they stayed at the waiting room.  They come at the bedside, one at a time.  At one time, his mother was at the bedside and patient started hollering and calling for nurse.  Went to the room and patient started cussing at his mom and I encouraged the mom to go out.  I spoke with the patient and he stated that they all lied to him.  They don't tell him as to what is wrong with him.  I explained to the patient that if he had some questions regarding his medical condition at this time, he can ask me and I will answer all his questions.  Patient was appreciative with this conversation and went to bed.

## 2015-01-25 NOTE — Progress Notes (Addendum)
ANTICOAGULATION CONSULT NOTE - Initial Consult   Pharmacy Consult for heparin Indication: ACS/CP  No Known Allergies  Patient Measurements:  Vital Signs: Temp: 98.4 F (36.9 C) (05/30 0422) Temp Source: Oral (05/30 0422) BP: 163/92 mmHg (05/30 0422) Pulse Rate: 73 (05/30 0422)  Labs:  Recent Labs  01/22/15 2345  01/23/15 0335 01/23/15 1145 01/24/15 0400 01/24/15 1350 01/25/15 0430  HGB  --   < > 14.5  --  11.1*  --  11.3*  HCT  --   --  41.3  --  31.7*  --  33.0*  PLT  --   --  145*  --  95*  --  125*  HEPARINUNFRC  --   --  0.33 0.31 <0.10* 0.26*  --   CREATININE 0.90  --  0.97  --  0.65  --   --   < > = values in this interval not displayed.  Estimated Creatinine Clearance: 104.1 mL/min (by C-G formula based on Cr of 0.65).  Assessment: 28 yom presented today after leaving AMA earlier today. To resume heparin drip for anticoagulation. Last level was low and rate increased to 1250 units/hr but a level was never checked at this rate. H/H slightly low and plts are low as well. No bleeding noted.          Goal of Therapy:  Heparin level 0.3-0.7 units/ml Monitor platelets by anticoagulation protocol: Yes   Plan:  - Heparin bolus 4000 units IV x 1 - Heparin gtt 1250 units/hr (based on dosing from yesterday) - Check a 6 hour heparin level - Daily heparin level and CBC  Rumbarger, Rande Lawman 01/25/2015,2:22 PM    Addendum -Heparin level is subtherapeutic -Increase rate to 1400 units/hr -Next HL with am labs   Harvel Quale 01/25/2015 10:49 PM

## 2015-01-25 NOTE — Progress Notes (Signed)
I went to the waiting room to talk to the family members and explained to them that patient seems upset when they are hovering at him.  Prior for me talking to the family, I observed that the chairs were too close to the patient's bed.  Explained to them that they can stay in the room and just stay in the corner for him to know that you are there to support him.  They all verbalized understanding.

## 2015-01-25 NOTE — Progress Notes (Signed)
Subjective: The patient left AMA today - felt he was here for too long. He was brought back, had IJ line in place when left. He denies chest pain or SOB.  Note:  Patient's chart flagged for potential chart merger.  Med hx includes HCV and tobacco abuse.  Objective: Vital signs in last 24 hours: There were no vitals filed for this visit. Weight change:  No intake or output data in the 24 hours ending 01/25/15 1258 General: resting in bed in NAD HEENT: [place]/AT Cardiac: RRR, no rubs, murmurs or gallops; cental chest TTP; 2+ radial and PT B/L, 1+ DP on right, 2+ DP on left Pulm: clear to auscultation bilaterally, moving normal volumes of air Abd: soft, nontender, nondistended, BS present Ext: warm and well perfused, no pedal edema Neuro: alert and oriented X3, moving extremities spontaneously, responding appropriately, following commands  Telemetry:  NSR 70s  Lab Results: Basic Metabolic Panel:  Recent Labs Lab 01/22/15 0820  01/23/15 0335 01/24/15 0400  NA 137  < > 137 131*  K 3.4*  < > 4.3 3.6  CL 112*  < > 109 101  CO2 15*  < > 17* 24  GLUCOSE 124*  < > 135* 95  BUN 12  < > 15 8  CREATININE 0.59*  < > 0.97 0.65  CALCIUM 8.1*  < > 8.0* 8.1*  MG 1.8  --   --   --   < > = values in this interval not displayed. CBC:  Recent Labs Lab 01/24/15 0400 01/25/15 0430  WBC 18.8* 16.1*  NEUTROABS 15.5*  --   HGB 11.1* 11.3*  HCT 31.7* 33.0*  MCV 80.7 80.3  PLT 95* 125*   Cardiac Enzymes:  Recent Labs Lab 01/21/15 1452 01/21/15 1709 01/22/15 0233  TROPONINI 4.71* 6.24* 3.44*   CBG:  Recent Labs Lab 01/23/15 1146 01/23/15 1550 01/23/15 2118 01/24/15 0746 01/24/15 1121 01/24/15 2026  GLUCAP 134* 105* 87 99 111* 111*   Coagulation:  Recent Labs Lab 01/21/15 0830 01/21/15 1642  LABPROT 17.3* 16.4*  INR 1.40 1.31   Urine Drug Screen: Drugs of Abuse     Component Value Date/Time   LABOPIA NONE DETECTED 01/21/2015 0915   COCAINSCRNUR NONE DETECTED  01/21/2015 0915   LABBENZ POSITIVE* 01/21/2015 0915   AMPHETMU NONE DETECTED 01/21/2015 0915   THCU POSITIVE* 01/21/2015 0915   LABBARB NONE DETECTED 01/21/2015 0915    Studies/Results: Ct Head Wo Contrast  01/21/2015   ADDENDUM REPORT: 01/21/2015 11:20  ADDENDUM: These results were called by telephone at the time of interpretation on 01/21/2015 at 11:19 am to Dr. Gwendolyn Lima, who verbally acknowledged these results.   Electronically Signed   By: Lowella Grip III M.D.   On: 01/21/2015 11:20   01/21/2015   CLINICAL DATA:  Status post cardiac arrest  EXAM: CT HEAD WITHOUT CONTRAST  TECHNIQUE: Contiguous axial images were obtained from the base of the skull through the vertex without intravenous contrast.  COMPARISON:  None.  FINDINGS: The ventricles are normal in size and configuration. There is no intracranial mass, hemorrhage, extra-axial fluid collection, or midline shift. No focal gray-white compartment lesions are identified.  Both middle cerebral arteries show symmetric increased attenuation. Bony calvarium appears intact. The mastoid air cells are clear.  IMPRESSION: Increased attenuation is noted in both middle cerebral arteries. The significance of this symmetric finding is uncertain. Given the history, the possibility of bilateral middle cerebral artery thrombosis must be of concern. This finding may well  warrant CT or MR angiography to further assess. There is no intracranial edema. Gray-white compartments appear normal. No acute hemorrhage evident.  Message left for Dr. Gwendolyn Lima with respect to this report, Jan 21, 2015, 10:56 a.m.  Electronically Signed: By: Lowella Grip III M.D. On: 01/21/2015 10:56   Dg Chest Port 1 View  01/22/2015   CLINICAL DATA:  Acute respiratory failure with hypoxia  EXAM: PORTABLE CHEST - 1 VIEW  COMPARISON:  01/21/2015  FINDINGS: Support devices remain in stable position. Interval improvement in aeration with resolution of bilateral airspace opacities.  No confluent opacities. Heart is normal size. No effusions or pneumothorax. No acute bony abnormality.  IMPRESSION: Interval resolution of bilateral airspace disease. Support devices stable.   Electronically Signed   By: Rolm Baptise M.D.   On:   01/21/15 Limited 2D ECHO: EF- 35%,  Apex and lateral walls move a little better than the other walls. The inferior septum is dyskinetic. The cavity size was normal. Wall thickness was increased in a pattern of mild LVH. The estimated ejection fraction was 35%. - Right ventricle: The cavity size was normal. Systolic function was mildly reduced.  01/21/15 EEG: Clinical Interpretation: This EEG is essentially a normal drowsy and sleep EEG though cannot rule out encephalopathy with this study. There was no seizure or seizure predisposition recorded on this study.   01/22/15 2D ECHO (complete): Study Conclusions  - Procedure narrative: Transthoracic echocardiography. Image quality was adequate. The study was technically difficult. Intravenous contrast (Definity) was administered. - Left ventricle: Wall motion is decreased since study of 01/21/2015. Apex and lateral walls move a little better than the other walls. The inferior septum is dyskinetic. The cavity size was normal. Wall thickness was increased in a pattern of mild LVH. The estimated ejection fraction was 35%. - Right ventricle: The cavity size was normal. Systolic function was mildly reduced.  Medications: I have reviewed the patient's current medications. Scheduled Meds:  Continuous Infusions:  PRN Meds:.   Assessment/Plan:   VF arrest: Patient neurologic status appears to be intact despite initial poor prognosis given prolonged CPR- ~67mins, lactic acidosis, decorticate posturing on admission.  Trop 0.853-->4.71-->6.24-->3.44 and EKG not consistent with ACS.  2D ECHO was technically difficult, reveals increased LV wall motion (compared to 05/26 limited ECHO),  dyskinetic inferior septum, mild LVH, EF 35% (full conclusion above).  Chest wall pain likely due to compressions. - continue heparin gtt and ASA - add low dose BB and lipitor today - LHC planned for 01/26/15; depending on results may need to contact EP for ICD vs temporary LifeVest  Cardiomyopathy:  EF 35%, inferior septum dyskinetic.  - LHC planned for 01/26/15; depending on results may need to contact EP for ICD vs temporary LifeVest - continue ASA  - adding BB and statin as above - plan to start ACEI after Cath  Acute encephalopathy: Resolving. CT revealed increased attenuation in both MCAs concerning for B/L MCA thrombosis however this is unlikely given remarkable neurologic recovery. - per primary  Leukocytosis:  WBC improving (down to 18.8).  On Unasyn for possible aspiration PNA.  AM CXR pending. - continue to monitor CBC - per primary  Thrombocytopenia:  Plt 177K on 05/26, now 95K.  4T score for HIT is 2 which indicates a low probability (<5%) of HIT. - continue to monitor CBC and for signs of bleeding - per primary   54 year old male, smoker who is s/p SCD at home with prolonged resuscitation, s/p hypothermia protocol, who  neurologically recovered, plan to extubate yesterday with full neurologic recovery. NSTEMI peak troponin 6.24, now downtrending, echo shows LVEF 35% with regional wall motion abnormalities. We will plan for a left heart cath on Tuesday. Currently on ASA, heparin drip, we will add low dose metorprolol 12.5 mg po BID and atorvastatin 80 mg po daily.  The patient left AMA this am, he has h/o possible bipolar disorder and h/o etoh abuse. Pulmonary critical care will for psychiatry consult for evaluation of competency.    Dorothy Spark 01/25/2015

## 2015-01-25 NOTE — H&P (Addendum)
PULMONARY / CRITICAL CARE MEDICINE   Name: [name] [name] MRN: [id number] DOB: [birth year]    ADMISSION DATE:  01/25/15   REFERRING MD :  Mingo Amber  CHIEF COMPLAINT:  Post arrest   INITIAL PRESENTATION:  54 year old male admitted to [facility] 5/26 s/p VF arrest. ROSC estimated at > 45 minutes.  Left AMA 5/30 with RIJ in place . [employer] contacted . Pt returned with family .  PCCM to readmit since pt out of facility >4hr.   STUDIES:  ECHO 5/26: LVEF grossly normal, mild anteroseptal hypokinesis, RV size/function normal CT head 5/26>>> possible bilateral MCA obstruction, no edema, normal Gray-white junction Echo 5/27 : LVEF 35%, inferior septum dyskinetic, RV function mildly reduced  SIGNIFICANT EVENTS: 5/26: initiated cooling  5/28 re-warmed, interactive and following commands 5/30 Left AMA with R.IJ and returned 4 hr later    HISTORY OF PRESENT ILLNESS:   54 year old male w/ remote h/o cocaine abuse. Per family had been doing heavy outdoor work the day prior. Then the am on 5/26 reported cc: left shoulder pain which he initially felt was d/t heavy work the day prior. Family then reports they walked back into room with him on floor w/ seizure like activity. EMS was called. No bystander CPR performed. On arrival rhythm was VF. ROSC estimated at 45 minutes w/ transient pulse return during this time. On arrival to ER he was over-breathing vent, had decorticate posturing. PCCM   Admitted 5/26 . He was placed on cooling protocol and rewarmed on 5/28. He had improved mentation and following commands . CXR was initially suspious for aspiration PNA , cxr improved . He was placed on Unasyn IV. Sputum cx is pending . CT head showed no acute process , there questionable bilateral MCA obstruction with exam was not consistent with this. Echo showed CM and mild anteroseptal hypokinesis . He was continued on Hep drip and cards consider possible Left heart cath.   Pt with angry outburst and combativeness, he left AMA on  5/30 early with RIJ CVL in place. Family and [employer] were contacted and pt returned to hospital around 4 hours later with aggreement to stay for further evaluation and treatment .   Pt out of hospital 4hours , had to d/c and readmit this patient.   PAST MEDICAL HISTORY :   has a past medical history of Medical history non-contributory.  has past surgical history that includes No past surgeries. Prior to Admission medications   Not on File   No Known Allergies  FAMILY HISTORY:  has no family status information on file.  SOCIAL HISTORY:  reports that he has been smoking Cigarettes.  He has a 35 pack-year smoking history. He does not have any smokeless tobacco history on file.  REVIEW OF SYSTEMS:  unable  VITAL SIGNS: Temp:  [97.9 F (36.6 C)-98.5 F (36.9 C)] 98.4 F (36.9 C) (05/30 0422) Pulse Rate:  [71-90] 73 (05/30 0422) Resp:  [18-22] 18 (05/30 0422) BP: (132-163)/(73-95) 163/92 mmHg (05/30 0422) SpO2:  [94 %-98 %] 97 % (05/30 0422) HEMODYNAMICS: CV stable   ON RA   INTAKE / OUTPUT: No intake or output data in the 24 hours ending 01/25/15 1258  PHYSICAL EXAMINATION: GEN: A/Ox3;  , NAD, well nourished  poor disease insight but not agitated.  HEENT:  [place]/AT,  EACs-clear, TMs-wnl, NOSE-clear, THROAT-clear, no lesions, no postnasal drip or exudate noted.   NECK:  Supple w/ fair ROM; no JVD; normal carotid impulses w/o bruits; no thyromegaly or  nodules palpated; no lymphadenopathy.  RESP  Clear  P & A; w/o, wheezes/ rales/ or rhonchi.no accessory muscle use, no dullness to percussion  CARD:  RRR, no m/r/g  , no peripheral edema, pulses intact, no cyanosis or clubbing.  GI:   Soft & nt; nml bowel sounds; no organomegaly or masses detected.  Musco: Warm bil, no deformities or joint swelling noted.   Neuro: alert, no focal deficits noted.    Skin: Warm, no lesions or rashes   LABS:  CBC  Recent Labs Lab 01/23/15 0335 01/24/15 0400 01/25/15 0430  WBC 23.0* 18.8*  16.1*  HGB 14.5 11.1* 11.3*  HCT 41.3 31.7* 33.0*  PLT 145* 95* 125*   Coag's  Recent Labs Lab 01/21/15 0830 01/21/15 1642  APTT 50* 101*  INR 1.40 1.31   BMET  Recent Labs Lab 01/22/15 2345 01/23/15 0335 01/24/15 0400  NA 137 137 131*  K 5.1 4.3 3.6  CL 111 109 101  CO2 18* 17* 24  BUN 13 15 8   CREATININE 0.90 0.97 0.65  GLUCOSE 107* 135* 95   Electrolytes  Recent Labs Lab 01/22/15 0820  01/22/15 2345 01/23/15 0335 01/24/15 0400  CALCIUM 8.1*  < > 8.2* 8.0* 8.1*  MG 1.8  --   --   --   --   < > = values in this interval not displayed. Sepsis Markers  Recent Labs Lab 01/21/15 0830 01/21/15 0839 01/22/15 0410 01/23/15 0335 01/23/15 0851  LATICACIDVEN  --  10.65*  --   --  2.3*  PROCALCITON <0.10  --  3.14 2.28  --    ABG  Recent Labs Lab 01/21/15 0853  PHART 7.132*  PCO2ART 43.1  PO2ART 324.0*   Liver Enzymes No results for input(s): AST, ALT, ALKPHOS, BILITOT, ALBUMIN in the last 168 hours. Cardiac Enzymes  Recent Labs Lab 01/21/15 1452 01/21/15 1709 01/22/15 0233  TROPONINI 4.71* 6.24* 3.44*   Glucose  Recent Labs Lab 01/23/15 1146 01/23/15 1550 01/23/15 2118 01/24/15 0746 01/24/15 1121 01/24/15 2026  GLUCAP 134* 105* 87 99 111* 111*    Imaging No results found.   ASSESSMENT / PLAN:  PULMONARY OETT 5/26>>>5/28 A: Acute cardiopulmonary arrest>extubated 5/28  Acute Pulmonary edema >resolved  Aspiration pneumonitis resolved off ABX P:   O2 for sat >90% IS   CARDIOVASCULAR CVL right IJ CVL 5/26>>> A:  VF arrest>completed hypothermia protocol  Cardiomyopathy by echo 5/28 NSTEMI Afib > resolved Prolonged QTc > resolved P:   Heparin gtt and asa   Cards plans [facility] 5/31  RENAL A:   Lactic acidosis s/p cardiac arrest>resolved     P:   Monitor BMET and UOP    GASTROINTESTINAL A:   No acute  P:   Diet as tolerated   HEMATOLOGIC A:   No acute  P:  Trend CBC Transfuse per ICU protocol      INFECTIOUS A:   R/o aspiration PNA Note CXR 5/29 was CLEAR P:    Sputum 5/26>>>  Unasyn 5/27 >>5/29 augmentin 5/30>>  ENDOCRINE A:   No acute   P:   Trend glucose   NEUROLOGIC A:   Acute encephalopathy/COMA s/p cardiac arrest> resolved Agitation and combative behavior ? withdral  Exam not consistent with bilateral MCA thrombosis   P:  Plan to have psych eval for competency Limit visitation to immediate family     FAMILY  - Updates: wife and son in attendance.   We asked TR[facility] to assume medical care in AM 01/26/15  Mariel Sleet Beeper  [phone]  Cell  [phone]  If no response or cell goes to voicemail, call beeper [phone]   01/25/2015, 12:58 PM

## 2015-01-25 NOTE — Progress Notes (Signed)
Pt sister called and spoke with nurse. Family was informed of their right to request pt be committed for a psych evaluation and to contact [facility] police department. The risk of sudden cardiac death, air embolism leading to stroke, infection, and bleeding was discussed. The sister said family would contact the police but was concerned if we would treat the pt if he returned. I informed the sister that we would treat him and wanted him to return to the hospital. The sister stated that she would call us back and give [place] updates.   Delena Serve

## 2015-01-25 NOTE — Care Management Note (Signed)
Case Management Note  Patient Details  Name: Johnathyn Viscomi MRN: [id number] Date of Birth: 27-Nov-1960  Subjective/Objective:     Readmit after ama               Action/Plan:lives w fam   Expected Discharge Date:                  Expected Discharge Plan:  Home/Self Care  In-House Referral:     Discharge planning Services     Post Acute Care Choice:    Choice offered to:     DME Arranged:    DME Agency:     HH Arranged:    [place] Agency:     Status of Service:     Medicare Important Message Given:    Date Medicare IM Given:    Medicare IM give by:    Date Additional Medicare IM Given:    Additional Medicare Important Message give by:     If discussed at [place] of Stay Meetings, dates discussed:    Additional Comments: ur review done. Will place zoll lifevest form on shadow chart in case needed.  Lacretia Leigh, RN 01/25/2015, 1:56 PM
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
## 2015-01-25 NOTE — Progress Notes (Signed)
Stepson came earlier this morning to pick up his portable TV and he stated that patient will be back at 1200 after he goes to sleep.  I just spoke with the [facility], and informed of this conversation.

## 2015-01-25 NOTE — Progress Notes (Signed)
Patient came back from home i street clothes with family and [facility] is made aware and I also spoke with Ms. Duard Brady regarding this situation.  Patient in this situation will be discharge and be consider as direct admit.     Spoke with the wife, Lynelle Smoke, she stated that she wants to have a limited guests 3 guests: Tammy, pt's stepson and his daughter.

## 2015-01-26 ENCOUNTER — Emergency Department (HOSPITAL_COMMUNITY): Payer: Self-pay

## 2015-01-26 ENCOUNTER — Inpatient Hospital Stay (HOSPITAL_COMMUNITY)
Admission: EM | Admit: 2015-01-26 | Discharge: 2015-01-30 | DRG: 245 | Disposition: A | Discharge status: Home / Self Care | Payer: Self-pay | Attending: Internal Medicine | Admitting: Internal Medicine

## 2015-01-26 ENCOUNTER — Encounter (HOSPITAL_COMMUNITY)
Admission: AD | Discharge status: Against Medical Advice | Payer: Self-pay | Source: Ambulatory Visit | Attending: Critical Care Medicine

## 2015-01-26 ENCOUNTER — Encounter (HOSPITAL_COMMUNITY): Payer: Self-pay | Admitting: General Practice

## 2015-01-26 ENCOUNTER — Encounter (HOSPITAL_COMMUNITY): Payer: Self-pay | Admitting: Emergency Medicine

## 2015-01-26 DIAGNOSIS — Z959 Presence of cardiac and vascular implant and graft, unspecified: Secondary | ICD-10-CM

## 2015-01-26 DIAGNOSIS — I469 Cardiac arrest, cause unspecified: Secondary | ICD-10-CM | POA: Diagnosis present

## 2015-01-26 DIAGNOSIS — F4312 Post-traumatic stress disorder, chronic: Secondary | ICD-10-CM | POA: Diagnosis present

## 2015-01-26 DIAGNOSIS — F17200 Nicotine dependence, unspecified, uncomplicated: Secondary | ICD-10-CM | POA: Diagnosis present

## 2015-01-26 DIAGNOSIS — Z811 Family history of alcohol abuse and dependence: Secondary | ICD-10-CM

## 2015-01-26 DIAGNOSIS — Z5329 Procedure and treatment not carried out because of patient's decision for other reasons: Secondary | ICD-10-CM | POA: Diagnosis present

## 2015-01-26 DIAGNOSIS — I255 Ischemic cardiomyopathy: Secondary | ICD-10-CM

## 2015-01-26 DIAGNOSIS — R4182 Altered mental status, unspecified: Secondary | ICD-10-CM

## 2015-01-26 DIAGNOSIS — I214 Non-ST elevation (NSTEMI) myocardial infarction: Secondary | ICD-10-CM

## 2015-01-26 DIAGNOSIS — Z8674 Personal history of sudden cardiac arrest: Secondary | ICD-10-CM

## 2015-01-26 DIAGNOSIS — F17210 Nicotine dependence, cigarettes, uncomplicated: Secondary | ICD-10-CM | POA: Diagnosis present

## 2015-01-26 DIAGNOSIS — R079 Chest pain, unspecified: Secondary | ICD-10-CM | POA: Diagnosis present

## 2015-01-26 DIAGNOSIS — F4310 Post-traumatic stress disorder, unspecified: Secondary | ICD-10-CM | POA: Diagnosis present

## 2015-01-26 DIAGNOSIS — Z79899 Other long term (current) drug therapy: Secondary | ICD-10-CM

## 2015-01-26 DIAGNOSIS — F1010 Alcohol abuse, uncomplicated: Secondary | ICD-10-CM | POA: Diagnosis present

## 2015-01-26 DIAGNOSIS — I4901 Ventricular fibrillation: Principal | ICD-10-CM | POA: Diagnosis present

## 2015-01-26 DIAGNOSIS — Z5320 Procedure and treatment not carried out because of patient's decision for unspecified reasons: Secondary | ICD-10-CM

## 2015-01-26 DIAGNOSIS — I10 Essential (primary) hypertension: Secondary | ICD-10-CM | POA: Diagnosis present

## 2015-01-26 DIAGNOSIS — F1290 Cannabis use, unspecified, uncomplicated: Secondary | ICD-10-CM | POA: Diagnosis present

## 2015-01-26 DIAGNOSIS — Z825 Family history of asthma and other chronic lower respiratory diseases: Secondary | ICD-10-CM

## 2015-01-26 DIAGNOSIS — R0789 Other chest pain: Secondary | ICD-10-CM | POA: Diagnosis present

## 2015-01-26 DIAGNOSIS — E876 Hypokalemia: Secondary | ICD-10-CM | POA: Diagnosis present

## 2015-01-26 DIAGNOSIS — R911 Solitary pulmonary nodule: Secondary | ICD-10-CM | POA: Diagnosis present

## 2015-01-26 DIAGNOSIS — Z8249 Family history of ischemic heart disease and other diseases of the circulatory system: Secondary | ICD-10-CM

## 2015-01-26 DIAGNOSIS — B1920 Unspecified viral hepatitis C without hepatic coma: Secondary | ICD-10-CM | POA: Diagnosis present

## 2015-01-26 HISTORY — PX: CARDIAC CATHETERIZATION: SHX172

## 2015-01-26 LAB — GLUCOSE, CAPILLARY
GLUCOSE-CAPILLARY: 105 mg/dL — AB (ref 65–99)
Glucose-Capillary: 119 mg/dL — ABNORMAL HIGH (ref 65–99)
Glucose-Capillary: 98 mg/dL (ref 65–99)
Glucose-Capillary: 143 mg/dL — ABNORMAL HIGH (ref 65–99)
Glucose-Capillary: 186 mg/dL — ABNORMAL HIGH (ref 65–99)

## 2015-01-26 LAB — CBC
HCT: 37.3 % — ABNORMAL LOW (ref 39.0–52.0)
HEMATOCRIT: 34.8 % — AB (ref 39.0–52.0)
Hemoglobin: 11.9 g/dL — ABNORMAL LOW (ref 13.0–17.0)
Hemoglobin: 13.1 g/dL (ref 13.0–17.0)
MCH: 27.6 pg (ref 26.0–34.0)
MCH: 28.2 pg (ref 26.0–34.0)
MCHC: 34.2 g/dL (ref 30.0–36.0)
MCHC: 35.1 g/dL (ref 30.0–36.0)
MCV: 80.7 fL (ref 78.0–100.0)
MCV: 80.4 fL (ref 78.0–100.0)
Platelets: 152 10*3/uL (ref 150–400)
Platelets: 165 10*3/uL (ref 150–400)
RBC: 4.31 MIL/uL (ref 4.22–5.81)
RBC: 4.64 MIL/uL (ref 4.22–5.81)
RDW: 13.3 % (ref 11.5–15.5)
RDW: 13.5 % (ref 11.5–15.5)
WBC: 14.3 10*3/uL — ABNORMAL HIGH (ref 4.0–10.5)
WBC: 13.2 10*3/uL — ABNORMAL HIGH (ref 4.0–10.5)

## 2015-01-26 LAB — BASIC METABOLIC PANEL
ANION GAP: 6 (ref 5–15)
Anion gap: 9 (ref 5–15)
BUN: 10 mg/dL (ref 6–20)
BUN: 9 mg/dL (ref 6–20)
CO2: 25 mmol/L (ref 22–32)
CO2: 23 mmol/L (ref 22–32)
CREATININE: 0.73 mg/dL (ref 0.61–1.24)
Calcium: 8.1 mg/dL — ABNORMAL LOW (ref 8.9–10.3)
Calcium: 8.7 mg/dL — ABNORMAL LOW (ref 8.9–10.3)
Chloride: 104 mmol/L (ref 101–111)
Chloride: 105 mmol/L (ref 101–111)
Creatinine, Ser: 0.72 mg/dL (ref 0.61–1.24)
GFR calc non Af Amer: >60 mL/min (ref >60)
GLUCOSE: 116 mg/dL — AB (ref 65–99)
Glucose, Bld: 102 mg/dL — ABNORMAL HIGH (ref 65–99)
POTASSIUM: 3.7 mmol/L (ref 3.5–5.1)
Potassium: 3.4 mmol/L — ABNORMAL LOW (ref 3.5–5.1)
Sodium: 135 mmol/L (ref 135–145)
Sodium: 137 mmol/L (ref 135–145)

## 2015-01-26 LAB — I-STAT TROPONIN, ED: Troponin i, poc: 0.13 ng/mL (ref 0.00–0.08)

## 2015-01-26 LAB — BRAIN NATRIURETIC PEPTIDE: B Natriuretic Peptide: 140.1 pg/mL — ABNORMAL HIGH (ref 0.0–100.0)

## 2015-01-26 LAB — HEPARIN LEVEL (UNFRACTIONATED): Heparin Unfractionated: 0.28 IU/mL — ABNORMAL LOW (ref 0.30–0.70)

## 2015-01-26 SURGERY — LEFT HEART CATH AND CORONARY ANGIOGRAPHY
Anesthesia: LOCAL

## 2015-01-26 MED ORDER — FENTANYL CITRATE (PF) 100 MCG/2ML IJ SOLN
INTRAMUSCULAR | Status: DC | PRN
  Administered 2015-01-26: 50 ug via INTRAVENOUS

## 2015-01-26 MED ORDER — MIDAZOLAM HCL 2 MG/2ML IJ SOLN
INTRAMUSCULAR | Status: AC
  Filled 2015-01-26: qty 2

## 2015-01-26 MED ORDER — SODIUM CHLORIDE 0.9 % WEIGHT BASED INFUSION
3.0000 mL/kg/h | INTRAVENOUS | Status: AC
Start: 2015-01-26 — End: 2015-01-26

## 2015-01-26 MED ORDER — NITROGLYCERIN 0.4 MG SL SUBL
SUBLINGUAL_TABLET | SUBLINGUAL | Status: AC
  Filled 2015-01-26: qty 1

## 2015-01-26 MED ORDER — HYDRALAZINE HCL 20 MG/ML IJ SOLN
10.0000 mg | Freq: Four times a day (QID) | INTRAMUSCULAR | Status: DC | PRN
  Administered 2015-01-26: 10 mg via INTRAVENOUS
  Filled 2015-01-26: qty 1

## 2015-01-26 MED ORDER — LIDOCAINE HCL (PF) 1 % IJ SOLN
INTRAMUSCULAR | Status: AC
  Filled 2015-01-26: qty 30

## 2015-01-26 MED ORDER — FENTANYL CITRATE (PF) 100 MCG/2ML IJ SOLN
INTRAMUSCULAR | Status: AC
  Filled 2015-01-26: qty 2

## 2015-01-26 MED ORDER — SODIUM CHLORIDE 0.9 % IJ SOLN: 3.0000 mL | Freq: Two times a day (BID) | INTRAMUSCULAR | Status: DC

## 2015-01-26 MED ORDER — MIDAZOLAM HCL 2 MG/2ML IJ SOLN
INTRAMUSCULAR | Status: DC | PRN
  Administered 2015-01-26: 2 mg via INTRAVENOUS

## 2015-01-26 MED ORDER — HEPARIN SODIUM (PORCINE) 5000 UNIT/ML IJ SOLN: 5000.0000 [IU] | Freq: Three times a day (TID) | INTRAMUSCULAR | Status: DC

## 2015-01-26 MED ORDER — SODIUM CHLORIDE 0.9 % IV SOLN: 250.0000 mL | INTRAVENOUS | Status: DC | PRN

## 2015-01-26 MED ORDER — SODIUM CHLORIDE 0.9 % IJ SOLN: 3.0000 mL | INTRAMUSCULAR | Status: DC | PRN

## 2015-01-26 MED ORDER — HEPARIN (PORCINE) IN NACL 2-0.9 UNIT/ML-% IJ SOLN
INTRAMUSCULAR | Status: AC
  Filled 2015-01-26: qty 1500

## 2015-01-26 MED ORDER — VERAPAMIL HCL 2.5 MG/ML IV SOLN
INTRAVENOUS | Status: AC
  Filled 2015-01-26: qty 2

## 2015-01-26 MED ORDER — NITROGLYCERIN 1 MG/10 ML FOR IR/CATH LAB
INTRA_ARTERIAL | Status: AC
  Filled 2015-01-26: qty 10

## 2015-01-26 MED ORDER — IOHEXOL 350 MG/ML SOLN
INTRAVENOUS | Status: DC | PRN
  Administered 2015-01-26: 80 mL via INTRA_ARTERIAL

## 2015-01-26 MED ORDER — HEPARIN (PORCINE) IN NACL 100-0.45 UNIT/ML-% IJ SOLN
1500.0000 [IU]/h | INTRAMUSCULAR | Status: DC
  Filled 2015-01-26: qty 250

## 2015-01-26 SURGICAL SUPPLY — 14 items
CATH INFINITI 5FR ANG PIGTAIL (CATHETERS) IMPLANT
CATH INFINITI 5FR MULTPACK ANG (CATHETERS) ×2 IMPLANT
CATH OPTITORQUE TIG 4.0 5F (CATHETERS) IMPLANT
DEVICE RAD COMP TR BAND LRG (VASCULAR PRODUCTS) IMPLANT
DEVICE WIRE ANGIOSEAL 6FR (Vascular Products) ×2 IMPLANT
GLIDESHEATH SLEND A-KIT 6F 22G (SHEATH) ×2 IMPLANT
KIT HEART LEFT (KITS) ×2 IMPLANT
PACK CARDIAC CATHETERIZATION (CUSTOM PROCEDURE TRAY) ×2 IMPLANT
SHEATH PINNACLE 5F 10CM (SHEATH) ×2 IMPLANT
SYR MEDRAD MARK V 150ML (SYRINGE) ×2 IMPLANT
TRANSDUCER W/STOPCOCK (MISCELLANEOUS) ×2 IMPLANT
TUBING CIL FLEX 10 FLL-RA (TUBING) ×2 IMPLANT
WIRE EMERALD 3MM-J .035X150CM (WIRE) IMPLANT
WIRE SAFE-T 1.5MM-J .035X260CM (WIRE) ×2 IMPLANT

## 2015-01-26 NOTE — Discharge Summary (Signed)
Physician Discharge Summary  [name] [name] MPN:[id number] DOB: 1960/09/14 DOA: 01/25/2015  PCP: No primary care provider on file.  Admit date: 01/25/2015 Discharge date: 01/26/2015  Time spent: 0 minutes  Recommendations for Outpatient Follow-up: VF arrest:  -5/31 S/P cardiac catheterization patient left AMA  Cardiomyopathy: - EF 35%, inferior septum dyskinetic. patient left AMA  Acute encephalopathy:  Resolved patient left AMA.   Leukocytosis: -Patient left AMA   Thrombocytopenia:  -Patient left AMA   Discharge Diagnoses:  Principal Problem:   Altered mental status Active Problems:   Cardiac arrest   Ischemic cardiomyopathy   Restlessness and agitation   NSTEMI (non-ST elevated myocardial infarction)   Left against medical advice   Discharge Condition: Unknown  Diet recommendation: -Patient left [facility]  Filed Weights   01/25/15 1338 01/26/15 0457  Weight: 66.543 kg (146 lb 11.2 oz) 65.7 kg (144 lb 13.5 oz)    History of present illness:  54 year old male w/ remote h/o cocaine abuse, smoking abuse.   Per family had been doing heavy outdoor work the day prior. Then the am on 5/26 reported cc: left shoulder pain which he initially felt was d/t heavy work the day prior. Family then reports they walked back into room with him on floor w/ seizure like activity. EMS was called. No bystander CPR performed. On arrival rhythm was VF. ROSC estimated at 45 minutes w/ transient pulse return during this time. On arrival to ER he was over-breathing vent, had decorticate posturing. PCCM Admitted 5/26 . He was placed on cooling protocol and rewarmed on 5/28. He had improved mentation and following commands . CXR was initially suspious for aspiration PNA , cxr improved . He was placed on Unasyn IV. Sputum cx is pending . CT head showed no acute process , there questionable bilateral MCA obstruction with exam was not consistent with this. Echo showed CM and mild anteroseptal hypokinesis  . He was continued on Hep drip and cards consider possible Left heart cath. Pt with angry outburst and combativeness, he left AMA on 5/30 early with RIJ CVL in place. Family and [employer] were contacted and pt returned to hospital around 4 hours later with aggreement to stay for further evaluation and treatment .   Pt out of hospital 4hours , had to d/c and readmit this patient. -Patient left AMA     Discharge Exam: Filed Vitals:   01/26/15 0956 01/26/15 1001 01/26/15 1133 01/26/15 1605  BP: 133/91 142/93 166/105 148/89  Pulse: 61 63 72 69  Temp:   98.2 F (36.8 C) 98.8 F (37.1 C)  TempSrc:   Oral Oral  Resp: 17 4 22 24   Height:      Weight:      SpO2: 100% 100% 99% 97%    General:  Cardiovascular:  Respiratory:   Discharge Instructions     Medication List    ASK your doctor about these medications        aspirin EC 81 MG tablet  Take 81 mg by mouth daily.     MULTIVITAMIN ADULTS 50+ Tabs  Take 1 tablet by mouth daily.       No Known Allergies    The results of significant diagnostics from this hospitalization (including imaging, microbiology, ancillary and laboratory) are listed below for reference.    Significant Diagnostic Studies: Ct Head Wo Contrast  01/21/2015   ADDENDUM REPORT: 01/21/2015 11:20  ADDENDUM: These results were called by telephone at the time of interpretation on 01/21/2015 at 11:19 am  to Dr. Gwendolyn Lima, who verbally acknowledged these results.   Electronically Signed   By: Lowella Grip III M.D.   On: 01/21/2015 11:20   01/21/2015   CLINICAL DATA:  Status post cardiac arrest  EXAM: CT HEAD WITHOUT CONTRAST  TECHNIQUE: Contiguous axial images were obtained from the base of the skull through the vertex without intravenous contrast.  COMPARISON:  None.  FINDINGS: The ventricles are normal in size and configuration. There is no intracranial mass, hemorrhage, extra-axial fluid collection, or midline shift. No focal gray-white compartment lesions  are identified.  Both middle cerebral arteries show symmetric increased attenuation. Bony calvarium appears intact. The mastoid air cells are clear.  IMPRESSION: Increased attenuation is noted in both middle cerebral arteries. The significance of this symmetric finding is uncertain. Given the history, the possibility of bilateral middle cerebral artery thrombosis must be of concern. This finding may well warrant CT or MR angiography to further assess. There is no intracranial edema. Gray-white compartments appear normal. No acute hemorrhage evident.  Message left for Dr. Gwendolyn Lima with respect to this report, Jan 21, 2015, 10:56 a.m.  Electronically Signed: By: Lowella Grip III M.D. On: 01/21/2015 10:56   Portable Chest 1 View  01/25/2015   CLINICAL DATA:  Cough and chest pain today.  EXAM: PORTABLE CHEST - 1 VIEW  COMPARISON:  Single view of the chest 01/24/2015 and 01/22/2015.  FINDINGS: Right IJ catheter remains in place. There is a very small left pleural effusion and mild subsegmental atelectasis in the left lung base. The right lung is clear. No pneumothorax.  IMPRESSION: Small left pleural effusion and mild atelectasis in left lung base.   Electronically Signed   By: Inge Rise M.D.   On: 01/25/2015 14:33   Dg Chest Port 1 View  01/24/2015   CLINICAL DATA:  Acute respiratory failure with hypoxia. Follow-up exam.  EXAM: PORTABLE CHEST - 1 VIEW  COMPARISON:  01/22/2015  FINDINGS: Lungs remain hyperexpanded. There is mild basilar opacity likely atelectasis. There is mild bilateral interstitial thickening that predominate centrally, without substantial change from the most recent prior study allowing for differences in patient positioning and technique. No frank lung consolidation. No gross pneumothorax on this supine study.  No pneumothorax.  Endotracheal tube and nasogastric tube have been removed. Right internal jugular central venous line is stable in well positioned.  IMPRESSION: 1. No  change in lung aeration. There may be mild residual interstitial edema, but no evidence of airspace edema. There is underlying COPD. 2. Remaining support apparatus is well positioned.   Electronically Signed   By: Lajean Manes M.D.   On: 01/24/2015 08:53   Dg Chest Port 1 View  01/22/2015   CLINICAL DATA:  Acute respiratory failure with hypoxia  EXAM: PORTABLE CHEST - 1 VIEW  COMPARISON:  01/21/2015  FINDINGS: Support devices remain in stable position. Interval improvement in aeration with resolution of bilateral airspace opacities. No confluent opacities. Heart is normal size. No effusions or pneumothorax. No acute bony abnormality.  IMPRESSION: Interval resolution of bilateral airspace disease. Support devices stable.   Electronically Signed   By: Rolm Baptise M.D.   On: 01/22/2015 13:44   Dg Chest Port 1 View  01/21/2015   CLINICAL DATA:  Status post central line placement  EXAM: PORTABLE CHEST - 1 VIEW  COMPARISON:  01/21/2015  FINDINGS: Cardiac shadow is stable. An endotracheal tube, nasogastric catheter and right jugular central line are noted in satisfactory position. No pneumothorax is identified. Persistent  infiltrative changes are noted within both lungs. No new focal abnormality is seen.  IMPRESSION: Status post right central line placement without evidence of pneumothorax.  The remainder of the exam is stable.   Electronically Signed   By: Inez Catalina M.D.   On: 01/21/2015 10:21   Dg Chest Portable 1 View  01/21/2015   CLINICAL DATA:  Hypoxia  EXAM: PORTABLE CHEST - 1 VIEW  COMPARISON:  April 01, 2013  FINDINGS: Endotracheal tube tip is 1.4 cm above the carina. Nasogastric tube tip and side port are below the diaphragm. No pneumothorax. There is airspace consolidation throughout the right upper lobe. Lungs elsewhere clear. Heart size and pulmonary vascularity are normal. No adenopathy. No focal bone lesions evident.  IMPRESSION: Tube positions as described without pneumothorax. Right upper  lobe airspace consolidation. Differential considerations include pneumonia, contusion/hemorrhage, or possible aspiration.   Electronically Signed   By: Lowella Grip III M.D.   On: 01/21/2015 09:02    Microbiology: Recent Results (from the past 240 hour(s))  MRSA PCR Screening     Status: None   Collection Time: 01/21/15 11:17 AM  Result Value Ref Range Status   MRSA by PCR NEGATIVE NEGATIVE Final    Comment:        The GeneXpert MRSA Assay (FDA approved for NASAL specimens only), is one component of a comprehensive MRSA colonization surveillance program. It is not intended to diagnose MRSA infection nor to guide or monitor treatment for MRSA infections.      Labs: Basic Metabolic Panel:  Recent Labs Lab 01/22/15 0820  01/22/15 2345 01/23/15 0335 01/24/15 0400 01/25/15 1600 01/26/15 0501  NA 137  < > 137 137 131* 134* 135  K 3.4*  < > 5.1 4.3 3.6 3.3* 3.7  CL 112*  < > 111 109 101 102 104  CO2 15*  < > 18* 17* 24 28 25   GLUCOSE 124*  < > 107* 135* 95 121* 102*  BUN 12  < > 13 15 8 8 10   CREATININE 0.59*  < > 0.90 0.97 0.65 0.76 0.72  CALCIUM 8.1*  < > 8.2* 8.0* 8.1* 8.1* 8.1*  MG 1.8  --   --   --   --   --   --   < > = values in this interval not displayed. Liver Function Tests:  Recent Labs Lab 01/25/15 1600  AST 396*  ALT 136*  ALKPHOS 64  BILITOT 0.8  PROT 6.0*  ALBUMIN 3.0*   No results for input(s): LIPASE, AMYLASE in the last 168 hours. No results for input(s): AMMONIA in the last 168 hours. CBC:  Recent Labs Lab 01/23/15 0335 01/24/15 0400 01/25/15 0430 01/25/15 1600 01/26/15 0501  WBC 23.0* 18.8* 16.1* 13.2* 14.3*  NEUTROABS  --  15.5*  --  9.5*  --   HGB 14.5 11.1* 11.3* 11.6* 11.9*  HCT 41.3 31.7* 33.0* 34.0* 34.8*  MCV 79.9 80.7 80.3 81.0 80.7  PLT 145* 95* 125* 134* 152   Cardiac Enzymes:  Recent Labs Lab 01/21/15 0830 01/21/15 1452 01/21/15 1709 01/22/15 0233  TROPONINI 0.53* 4.71* 6.24* 3.44*   BNP: BNP (last 3  results) No results for input(s): BNP in the last 8760 hours.  ProBNP (last 3 results) No results for input(s): PROBNP in the last 8760 hours.  CBG:  Recent Labs Lab 01/25/15 1338 01/25/15 1708 01/25/15 2227 01/26/15 0833 01/26/15 1214  GLUCAP 119* 98 143* 105* 186*       Signed:  Vicente Serene  Sherral Hammers, MD [facility] [phone] pager

## 2015-01-26 NOTE — Progress Notes (Signed)
ANTICOAGULATION CONSULT NOTE - Follow Up Consult  Pharmacy Consult for Heparin Indication: chest pain/ACS  No Known Allergies  Patient Measurements: Height: 5\' 9"  (175.3 cm) Weight: 144 lb 13.5 oz (65.7 kg) IBW/kg (Calculated) : 70.7  Vital Signs: Temp: 98 F (36.7 C) (05/31 0330) Temp Source: Oral (05/31 0330) BP: 152/93 mmHg (05/31 0615) Pulse Rate: 87 (05/31 0615)  Labs:  Recent Labs  01/24/15 0400 01/24/15 1350 01/25/15 0430 01/25/15 1600 01/25/15 2200 01/26/15 0501  HGB 11.1*  --  11.3* 11.6*  --  11.9*  HCT 31.7*  --  33.0* 34.0*  --  34.8*  PLT 95*  --  125* 134*  --  152  HEPARINUNFRC <0.10* 0.26*  --   --  0.17* 0.28*  CREATININE 0.65  --   --  0.76  --  0.72    Estimated Creatinine Clearance: 101.5 mL/min (by C-G formula based on Cr of 0.72).   Medications:  Heparin @ 1400 units/hr  Assessment: 53yom continues on heparin for possible ACS with plans for cath today. Heparin level is slightly below goal. CBC stable. No bleeding reported.  Goal of Therapy:  Heparin level 0.3-0.7 units/ml Monitor platelets by anticoagulation protocol: Yes   Plan:  1) Increase heparin to 1500 units/hr 2) Follow up after cath  Deboraha Sprang 01/26/2015,8:17 AM

## 2015-01-26 NOTE — Interval H&P Note (Signed)
History and Physical Interval Note:  01/26/2015 8:51 AM  [name] [name]  has presented today for surgery, with the diagnosis of Cardiac Arrest / NSTEMI, abnormal Echocardiogram.  The various methods of treatment have been discussed with the patient and family. After consideration of risks, benefits and other options for treatment, the patient has consented to   Procedure(s): Left Heart Cath and Coronary Angiography (N/A) with Possible Percutaneous Coronary Intervention as a surgical intervention .    The patient's history has been reviewed, patient examined, no change in status, stable for surgery.  I have reviewed the patient's chart and labs.  Questions were answered to the patient's satisfaction.     [name], [name]  Cath Lab Visit (complete for each Cath Lab visit)  Clinical Evaluation Leading to the Procedure:   ACS: Yes.    Non-ACS:    Anginal Classification: No Symptoms  Anti-ischemic medical therapy: Minimal Therapy (1 class of medications)  Non-Invasive Test Results: Equivocal test results - Reduced LVEF on Echo  Prior CABG: No previous CABG

## 2015-01-26 NOTE — Progress Notes (Signed)
Called to room by nurse tech, patient complaining of 10/10 chest pain, went to get patient NTG tablet, asked nurse tech to do EKG.  BP 148/8, HR 74, O2 sat 98.

## 2015-01-26 NOTE — ED Provider Notes (Signed)
CSN: [id number]     Arrival date & time 01/26/15  2106 History   First MD Initiated Contact with Patient 01/26/15 2125     Chief Complaint  Patient presents with  . Chest Pain   [name] [name] is a 54 y.o. male with a history of a recent cardiac arrest with ROSC on 01/21/15 who presents to the ED complaining of chest pain after he left the hospital AMA today. He is back tonight for readmission per request of hospitalist. The patient is complaining of substernal chest pain that is worse with palpation since he woke up from his cardiac arrest. He also reports feeling anxious because he is not sure why he had a cardiac arrest. He reports he left AMA today after he had a rude nurse on the floor. He reports he is ready to stay and be readmitted. He denies fevers, chills, illicit drug use, abdominal pain, nausea, vomiting, leg pain, leg swelling, headaches or rashes.  (Consider location/radiation/quality/duration/timing/severity/associated sxs/prior Treatment) HPI  Past Medical History  Diagnosis Date  . PULMONARY NODULE 12/21/2009    Stable 42mm per repeat CXR 4/11  . Hepatitis C     S/p completing treatment at [facility]  . PTSD (post-traumatic stress disorder)     Found deceased brother- resolved per pt  . Tobacco abuse     1 ppd x 2 ys. Quit 04/22/12  . IV drug abuse     Quit. Used in the 63s  . Depression     Resolved- pt contributes to previous drug use  . Anemia   . Substance abuse   . Hemorrhoids    Past Surgical History  Procedure Laterality Date  . Fracture surgery Left     In high school  . Cyst removal neck  2011  . Multiple tooth extractions  1999  . Wisdom tooth extraction     Family History  Problem Relation Age of Onset  . Hyperlipidemia Mother   . Hypertension Mother   . COPD Mother   . Colon polyps Mother 37  . Heart disease Father     Decease from MI at 69yo  . Cancer Maternal Grandfather     Colon cancer, deceased  . Alcohol abuse Maternal Grandfather   . Colon  cancer Maternal Grandfather 70  . Esophageal cancer Neg Hx   . Rectal cancer Neg Hx    History  Substance Use Topics  . Smoking status: Current Every Day Smoker -- 0.50 packs/day for 29 years    Types: Cigarettes  . Smokeless tobacco: Never Used     Comment: Stopped x 3-4 days  . Alcohol Use: 3.6 oz/week    6 Cans of beer per week    Review of Systems  Constitutional: Negative for fever and chills.  HENT: Negative for congestion and sore throat.   Eyes: Negative for visual disturbance.  Respiratory: Positive for shortness of breath. Negative for cough and wheezing.   Cardiovascular: Positive for chest pain. Negative for palpitations and leg swelling.  Gastrointestinal: Negative for nausea, vomiting, abdominal pain and diarrhea.  Genitourinary: Negative for dysuria.  Musculoskeletal: Negative for back pain and neck pain.  Skin: Negative for rash.  Neurological: Negative for dizziness, light-headedness and headaches.      Allergies  Review of patient's allergies indicates no known allergies.  Home Medications   Prior to Admission medications   Medication Sig Start Date End Date Taking? Authorizing Provider  aspirin 81 MG tablet Take 81 mg by mouth daily.  Yes Historical Provider, MD  ibuprofen (ADVIL,MOTRIN) 200 MG tablet Take 200 mg by mouth every 6 (six) hours as needed for pain.   Yes Historical Provider, MD  Multiple Vitamin (MULTIVITAMIN WITH MINERALS) TABS tablet Take 1 tablet by mouth daily.   Yes Historical Provider, MD  nitroGLYCERIN (NITROSTAT) 0.4 MG SL tablet Place 0.4 mg under the tongue every 5 (five) minutes as needed for chest pain.   Yes Historical Provider, MD   BP 145/85 mmHg  Pulse 75  Temp(Src) 98.4 F (36.9 C) (Oral)  Resp 25  SpO2 97% Physical Exam  Constitutional: He is oriented to person, place, and time. He appears well-developed and well-nourished. No distress.  HENT:  Head: Normocephalic and atraumatic.  Mouth/Throat: Oropharynx is clear  and moist.  Eyes: Conjunctivae are normal. Pupils are equal, round, and reactive to light. Right eye exhibits no discharge. Left eye exhibits no discharge.  Neck: Normal range of motion. Neck supple. No JVD present. No tracheal deviation present.  Cardiovascular: Normal rate, regular rhythm, normal heart sounds and intact distal pulses.  Exam reveals no gallop and no friction rub.   No murmur heard. Bilateral radial, posterior tibialis and dorsalis pedis pulses are intact.    Pulmonary/Chest: Effort normal and breath sounds normal. No respiratory distress. He has no wheezes. He has no rales. He exhibits tenderness.  Anterior chest wall tenderness to palpation.  Abdominal: Soft. He exhibits no distension. There is no tenderness.  Musculoskeletal: He exhibits no edema.  No lower extremity edema or tenderness.  Lymphadenopathy:    He has no cervical adenopathy.  Neurological: He is alert and oriented to person, place, and time. No cranial nerve deficit. Coordination normal.  Skin: Skin is warm and dry. No rash noted. He is not diaphoretic. No erythema. No pallor.  Psychiatric: His behavior is normal. His mood appears anxious.  Patient appears slightly anxious.  Nursing note and vitals reviewed.   ED Course  Procedures (including critical care time) Labs Review Labs Reviewed  CBC - Abnormal; Notable for the following:    WBC 13.2 (*)    HCT 37.3 (*)    All other components within normal limits  BASIC METABOLIC PANEL - Abnormal; Notable for the following:    Potassium 3.4 (*)    Glucose, Bld 116 (*)    Calcium 8.7 (*)    All other components within normal limits  BRAIN NATRIURETIC PEPTIDE - Abnormal; Notable for the following:    B Natriuretic Peptide 140.1 (*)    All other components within normal limits  I-STAT TROPOININ, ED - Abnormal; Notable for the following:    Troponin i, poc 0.13 (*)    All other components within normal limits    Imaging Review Dg Chest 2  View  01/26/2015   CLINICAL DATA:  Chest pain for 5 days, heart catheterization yesterday  EXAM: CHEST  2 VIEW  COMPARISON:  04/01/2013  FINDINGS: Cardiac shadow is within normal limits. Minimal left basilar atelectasis is seen. No sizable effusion or focal infiltrate is noted. No acute bony abnormality is seen.  IMPRESSION: Mild left basilar atelectasis.   Electronically Signed   By: Inez Catalina M.D.   On: 01/26/2015 21:54     EKG Interpretation   Date/Time:  Tuesday Jan 26 2015 21:13:48 EDT Ventricular Rate:  69 PR Interval:  128 QRS Duration: 96 QT Interval:  392 QTC Calculation: 420 R Axis:   60 Text Interpretation:  Normal sinus rhythm Nonspecific ST and T wave  abnormality Abnormal ECG No significant change since last tracing  Confirmed by [facility]  MD, CHRISTOPHER [phone]) on 01/26/2015 9:32:10 PM      Filed Vitals:   01/26/15 2230 01/26/15 2245 01/26/15 2300 01/26/15 2315  BP: 143/87 152/90 156/81 145/85  Pulse: 67 75 74 75  Temp:      TempSrc:      Resp: 9 24 25 25   SpO2: 98% 99% 99% 97%     MDM   Meds given in ED:  Medications - No data to display  New Prescriptions   No medications on file    Final diagnoses:  Chest pain, unspecified chest pain type   This  is a 54 y.o. male with a history of a recent cardiac arrest with ROSC on 01/21/15 who presents to the ED complaining of chest pain after he left the hospital AMA today. He is back tonight for readmission per request of hospitalist. The patient is complaining of substernal chest pain that is worse with palpation since he woke up from his cardiac arrest. He also reports feeling anxious because he is not sure why he had a cardiac arrest. Patient had a cardiac cath today that was clean. Patient is due to see the electrophysiologist but he left AMA prior to doing this. He currently complains of chest pain and some slight shortness of breath intermittently. He reports this chest pain has been constant since he awoke from  his cardiac arrest. He denies any worsening chest pain. The patient's troponin is elevated at 0.13 however the patient had a recent cardiac arrest and 4 days ago his troponin was greater than 4.0. On exam the patient is afebrile and nontoxic appearing. He has tenderness to palpation of his anterior chest wall that reproduces his chest pain. His chest x-ray shows mild left basilar atelectasis. His EKG shows no significant change from his last tracing.  Will consult hospitalist for readmission to finish his work up for his cardiac arrest. Consulted Dr. Humphrey Rolls who accepted this patient for admission. The patient is in agreement with admission.   This patient was discussed with Dr. Betsey Holiday who agrees with assessment and plan.     Waynetta Pean, PA-C 01/27/15 [id number]  Orpah Greek, MD 01/30/15 1020

## 2015-01-26 NOTE — Progress Notes (Signed)
Heard patient yelling from room, went in room to check on patient, still complaining of chest pain 10/10. Instructed patient to please quite down we have other patients on floor as well as family members.  Patient started yelling "I am the patient, you have to do what I say", said few curse words saying he wanted to sign out AMA, jerking his gown and monitor off.  Hewitt Shorts, NT in room during the episode, came out of room called security for assistance, also called Dr Ellyn Hack informed him that patient wanted to sign out AMA, consult in computer for IV team to come remove central line.

## 2015-01-26 NOTE — H&P (View-Only) (Signed)
Subjective: The patient left AMA today - felt he was here for too long. He was brought back, had IJ line in place when left. He denies chest pain or SOB.  Note:  Patient's chart flagged for potential chart merger.  Med hx includes HCV and tobacco abuse.  Objective: Vital signs in last 24 hours: There were no vitals filed for this visit. Weight change:  No intake or output data in the 24 hours ending 01/25/15 1258 General: resting in bed in NAD HEENT: [place]/AT Cardiac: RRR, no rubs, murmurs or gallops; cental chest TTP; 2+ radial and PT B/L, 1+ DP on right, 2+ DP on left Pulm: clear to auscultation bilaterally, moving normal volumes of air Abd: soft, nontender, nondistended, BS present Ext: warm and well perfused, no pedal edema Neuro: alert and oriented X3, moving extremities spontaneously, responding appropriately, following commands  Telemetry:  NSR 70s  Lab Results: Basic Metabolic Panel:  Recent Labs Lab 01/22/15 0820  01/23/15 0335 01/24/15 0400  NA 137  < > 137 131*  K 3.4*  < > 4.3 3.6  CL 112*  < > 109 101  CO2 15*  < > 17* 24  GLUCOSE 124*  < > 135* 95  BUN 12  < > 15 8  CREATININE 0.59*  < > 0.97 0.65  CALCIUM 8.1*  < > 8.0* 8.1*  MG 1.8  --   --   --   < > = values in this interval not displayed. CBC:  Recent Labs Lab 01/24/15 0400 01/25/15 0430  WBC 18.8* 16.1*  NEUTROABS 15.5*  --   HGB 11.1* 11.3*  HCT 31.7* 33.0*  MCV 80.7 80.3  PLT 95* 125*   Cardiac Enzymes:  Recent Labs Lab 01/21/15 1452 01/21/15 1709 01/22/15 0233  TROPONINI 4.71* 6.24* 3.44*   CBG:  Recent Labs Lab 01/23/15 1146 01/23/15 1550 01/23/15 2118 01/24/15 0746 01/24/15 1121 01/24/15 2026  GLUCAP 134* 105* 87 99 111* 111*   Coagulation:  Recent Labs Lab 01/21/15 0830 01/21/15 1642  LABPROT 17.3* 16.4*  INR 1.40 1.31   Urine Drug Screen: Drugs of Abuse     Component Value Date/Time   LABOPIA NONE DETECTED 01/21/2015 0915   COCAINSCRNUR NONE DETECTED  01/21/2015 0915   LABBENZ POSITIVE* 01/21/2015 0915   AMPHETMU NONE DETECTED 01/21/2015 0915   THCU POSITIVE* 01/21/2015 0915   LABBARB NONE DETECTED 01/21/2015 0915    Studies/Results: Ct Head Wo Contrast  01/21/2015   ADDENDUM REPORT: 01/21/2015 11:20  ADDENDUM: These results were called by telephone at the time of interpretation on 01/21/2015 at 11:19 am to Dr. Gwendolyn Lima, who verbally acknowledged these results.   Electronically Signed   By: Lowella Grip III M.D.   On: 01/21/2015 11:20   01/21/2015   CLINICAL DATA:  Status post cardiac arrest  EXAM: CT HEAD WITHOUT CONTRAST  TECHNIQUE: Contiguous axial images were obtained from the base of the skull through the vertex without intravenous contrast.  COMPARISON:  None.  FINDINGS: The ventricles are normal in size and configuration. There is no intracranial mass, hemorrhage, extra-axial fluid collection, or midline shift. No focal gray-white compartment lesions are identified.  Both middle cerebral arteries show symmetric increased attenuation. Bony calvarium appears intact. The mastoid air cells are clear.  IMPRESSION: Increased attenuation is noted in both middle cerebral arteries. The significance of this symmetric finding is uncertain. Given the history, the possibility of bilateral middle cerebral artery thrombosis must be of concern. This finding may well  warrant CT or MR angiography to further assess. There is no intracranial edema. Gray-white compartments appear normal. No acute hemorrhage evident.  Message left for Dr. Gwendolyn Lima with respect to this report, Jan 21, 2015, 10:56 a.m.  Electronically Signed: By: Lowella Grip III M.D. On: 01/21/2015 10:56   Dg Chest Port 1 View  01/22/2015   CLINICAL DATA:  Acute respiratory failure with hypoxia  EXAM: PORTABLE CHEST - 1 VIEW  COMPARISON:  01/21/2015  FINDINGS: Support devices remain in stable position. Interval improvement in aeration with resolution of bilateral airspace opacities.  No confluent opacities. Heart is normal size. No effusions or pneumothorax. No acute bony abnormality.  IMPRESSION: Interval resolution of bilateral airspace disease. Support devices stable.   Electronically Signed   By: Rolm Baptise M.D.   On:   01/21/15 Limited 2D ECHO: EF- 35%,  Apex and lateral walls move a little better than the other walls. The inferior septum is dyskinetic. The cavity size was normal. Wall thickness was increased in a pattern of mild LVH. The estimated ejection fraction was 35%. - Right ventricle: The cavity size was normal. Systolic function was mildly reduced.  01/21/15 EEG: Clinical Interpretation: This EEG is essentially a normal drowsy and sleep EEG though cannot rule out encephalopathy with this study. There was no seizure or seizure predisposition recorded on this study.   01/22/15 2D ECHO (complete): Study Conclusions  - Procedure narrative: Transthoracic echocardiography. Image quality was adequate. The study was technically difficult. Intravenous contrast (Definity) was administered. - Left ventricle: Wall motion is decreased since study of 01/21/2015. Apex and lateral walls move a little better than the other walls. The inferior septum is dyskinetic. The cavity size was normal. Wall thickness was increased in a pattern of mild LVH. The estimated ejection fraction was 35%. - Right ventricle: The cavity size was normal. Systolic function was mildly reduced.  Medications: I have reviewed the patient's current medications. Scheduled Meds:  Continuous Infusions:  PRN Meds:.   Assessment/Plan:   VF arrest: Patient neurologic status appears to be intact despite initial poor prognosis given prolonged CPR- ~47mins, lactic acidosis, decorticate posturing on admission.  Trop 0.853-->4.71-->6.24-->3.44 and EKG not consistent with ACS.  2D ECHO was technically difficult, reveals increased LV wall motion (compared to 05/26 limited ECHO),  dyskinetic inferior septum, mild LVH, EF 35% (full conclusion above).  Chest wall pain likely due to compressions. - continue heparin gtt and ASA - add low dose BB and lipitor today - LHC planned for 01/26/15; depending on results may need to contact EP for ICD vs temporary LifeVest  Cardiomyopathy:  EF 35%, inferior septum dyskinetic.  - LHC planned for 01/26/15; depending on results may need to contact EP for ICD vs temporary LifeVest - continue ASA  - adding BB and statin as above - plan to start ACEI after Cath  Acute encephalopathy: Resolving. CT revealed increased attenuation in both MCAs concerning for B/L MCA thrombosis however this is unlikely given remarkable neurologic recovery. - per primary  Leukocytosis:  WBC improving (down to 18.8).  On Unasyn for possible aspiration PNA.  AM CXR pending. - continue to monitor CBC - per primary  Thrombocytopenia:  Plt 177K on 05/26, now 95K.  4T score for HIT is 2 which indicates a low probability (<5%) of HIT. - continue to monitor CBC and for signs of bleeding - per primary   54 year old male, smoker who is s/p SCD at home with prolonged resuscitation, s/p hypothermia protocol, who  neurologically recovered, plan to extubate yesterday with full neurologic recovery. NSTEMI peak troponin 6.24, now downtrending, echo shows LVEF 35% with regional wall motion abnormalities. We will plan for a left heart cath on Tuesday. Currently on ASA, heparin drip, we will add low dose metorprolol 12.5 mg po BID and atorvastatin 80 mg po daily.  The patient left AMA this am, he has h/o possible bipolar disorder and h/o etoh abuse. Pulmonary critical care will for psychiatry consult for evaluation of competency.    Dorothy Spark 01/25/2015

## 2015-01-26 NOTE — ED Notes (Signed)
Pt. reports central chest pain onset this week with mild SOB and dry cough , denies emesis or diaphoresis , pt. stated he signed out AMA today due to " rude " nurses on the floor.

## 2015-01-26 NOTE — Progress Notes (Signed)
Pt left AMA via wheelchair with wife , RIJ dsg D&I, rt groin site level 0.Dr Clydene Laming notified.

## 2015-01-26 NOTE — Progress Notes (Signed)
Received EP consult for VF arrest.    Psych eval noted.  Will wait until after psych eval complete to see patient. Will plan to see tomorrow.  Chanetta Marshall, NP 01/26/2015 11:04 AM

## 2015-01-26 NOTE — H&P (Signed)
[facility] History and Physical  [name] [name] ZOX:[id number] DOB: Aug 27, 1961 DOA: 01/25/2015  Referring physician: Malachy Moan, MD PCP: No primary care provider on file.   Chief Complaint: returns after leaving AMA  HPI: [name] [name] is a 54 y.o. male with recent cardiac arrest left AMA it appears twice. Patient was originally admitted on May 26 with a full arrest. Patient was resuscitated and admitted to the ICU under hypothermia protocol. He had a cardiac cath done which was done this morning and showed no CAD. He apparently left because he states he was being treated poorly. Patient was convinced by his family to come back to the hospital. Patient now returns for further evaluation. He has no complaints at this time other than the chest pain from his CPR. Patient was supposed to see the EP physician which has not been done yet.   Review of Systems:  Complete ROS performed and is unremarkable other than HPI  Past Medical History  Diagnosis Date  . Cardiac arrest 12/2014  . Hepatitis C     history    Past Surgical History  Procedure Laterality Date  . Cardiac catheterization  01/26/2015  . Cardiac catheterization N/A 01/26/2015    Procedure: Left Heart Cath and Coronary Angiography; Surgeon: Leonie Man, MD; Location: [facility]; Service: Cardiovascular; Laterality: N/A;   Social History:  reports that he has been smoking Cigarettes. He has a 35 pack-year smoking history. He has never used smokeless tobacco. He reports that he drinks alcohol. He reports that he uses illicit drugs ("Crack" cocaine and Marijuana).  No Known Allergies  History reviewed. No pertinent family history.   Prior to Admission medications   Medication Sig Start Date End Date Taking? Authorizing Provider  aspirin EC 81 MG tablet Take 81 mg by mouth daily.    Historical Provider, MD  Multiple Vitamins-Minerals (MULTIVITAMIN ADULTS  50+) TABS Take 1 tablet by mouth daily.    Historical Provider, MD   Physical Exam: Filed Vitals:   01/26/15 0956 01/26/15 1001 01/26/15 1133 01/26/15 1605  BP: 133/91 142/93 166/105 148/89  Pulse: 61 63 72 69  Temp:   98.2 F (36.8 C) 98.8 F (37.1 C)  TempSrc:   Oral Oral  Resp: 17 4 22 24   Height:      Weight:      SpO2: 100% 100% 99% 97%    Wt Readings from Last 3 Encounters:  01/26/15 65.7 kg (144 lb 13.5 oz)     General: Appears calm and comfortable  Eyes: PERRL, normal lids, irises & conjunctiva  ENT: grossly normal hearing, lips & tongue  Neck: no LAD, masses or thyromegaly  Cardiovascular: RRR, no m/r/g. No LE edema.  Respiratory: CTA bilaterally, no w/r/r. Normal respiratory effort.  Abdomen: soft, ntnd  Skin: no rash or induration seen on limited exam  Musculoskeletal: grossly normal tone BUE/BLE  Psychiatric: grossly normal mood and affect  Neurologic: grossly non-focal.          Labs on Admission:  Basic Metabolic Panel:  Last Labs      Recent Labs Lab 01/22/15 0820  01/22/15 2345 01/23/15 0335 01/24/15 0400 01/25/15 1600 01/26/15 0501  NA 137 < > 137 137 131* 134* 135  K 3.4* < > 5.1 4.3 3.6 3.3* 3.7  CL 112* < > 111 109 101 102 104  CO2 15* < > 18* 17* 24 28 25   GLUCOSE 124* < > 107* 135* 95 121* 102*  BUN 12 < >  13 15 8 8 10   CREATININE 0.59* < > 0.90 0.97 0.65 0.76 0.72  CALCIUM 8.1* < > 8.2* 8.0* 8.1* 8.1* 8.1*  MG 1.8 --  --  --  --  --  --   < > = values in this interval not displayed.   Liver Function Tests:  Last Labs      Recent Labs Lab 01/25/15 1600  AST 396*  ALT 136*  ALKPHOS 64  BILITOT 0.8  PROT 6.0*  ALBUMIN 3.0*      Last Labs     No results for input(s): LIPASE, AMYLASE in the last 168 hours.    Last Labs     No results for  input(s): AMMONIA in the last 168 hours.   CBC:  Last Labs      Recent Labs Lab 01/23/15 0335 01/24/15 0400 01/25/15 0430 01/25/15 1600 01/26/15 0501  WBC 23.0* 18.8* 16.1* 13.2* 14.3*  NEUTROABS --  15.5* --  9.5* --   HGB 14.5 11.1* 11.3* 11.6* 11.9*  HCT 41.3 31.7* 33.0* 34.0* 34.8*  MCV 79.9 80.7 80.3 81.0 80.7  PLT 145* 95* 125* 134* 152     Cardiac Enzymes:  Last Labs      Recent Labs Lab 01/21/15 0830 01/21/15 1452 01/21/15 1709 01/22/15 0233  TROPONINI 0.53* 4.71* 6.24* 3.44*      BNP (last 3 results)  Recent Labs (within last 365 days)    No results for input(s): BNP in the last 8760 hours.    ProBNP (last 3 results)  Recent Labs (within last 365 days)    No results for input(s): PROBNP in the last 8760 hours.    CBG:  Last Labs      Recent Labs Lab 01/25/15 1338 01/25/15 1708 01/25/15 2227 01/26/15 0833 01/26/15 1214  GLUCAP 119* 98 143* 105* 186*      Radiological Exams on Admission:  Imaging Results (Last 48 hours)    Portable Chest 1 View  01/25/2015 CLINICAL DATA: Cough and chest pain today. EXAM: PORTABLE CHEST - 1 VIEW COMPARISON: Single view of the chest 01/24/2015 and 01/22/2015. FINDINGS: Right IJ catheter remains in place. There is a very small left pleural effusion and mild subsegmental atelectasis in the left lung base. The right lung is clear. No pneumothorax. IMPRESSION: Small left pleural effusion and mild atelectasis in left lung base. Electronically Signed By: Inge Rise M.D. On: 01/25/2015 14:33     Assessment/Plan Principal Problem:  Cardiac arrest Active Problems:  Ischemic cardiomyopathy  Altered mental status  Restlessness and agitation  NSTEMI (non-ST elevated myocardial infarction)  Left against medical advice  Chest pain   1. Chest Pain -this is likely non-cardiac and related to his CPR -will get enzymes -pain  control as needed  2. S/P cardiac arrest -will need EP evaluation -they were to see the patient before he Left AMA will need to be called again  Will have pharmacy review his medications and restart as the patient has two charts. I have spoken with the pharmacist to reconcile his medications.     Code Status: Full Code (must indicate code status--if unknown or must be presumed, indicate so) DVT Prophylaxis:Heparin Family Communication: Mother (indicate person spoken with, if applicable, with phone number if by telephone) Disposition Plan: Home (indicate anticipated LOS)  Time spent: 23min  KHAN,SAADAT A [facility] Pager [phone]

## 2015-01-26 NOTE — Consult Note (Signed)
[facility] Face-to-Face Psychiatry Consult   Reason for Consult:  Capacity evaluation Referring Physician:  Dr. Joya Gaskins  Patient Identification: [name] [name] MRN:  [id number] Principal Diagnosis: Altered mental status Diagnosis:   Patient Active Problem List   Diagnosis Date Noted  . NSTEMI (non-ST elevated myocardial infarction) [I21.4] 01/26/2015  . Ischemic cardiomyopathy [I25.5] 01/25/2015  . Altered mental status [R41.82] 01/25/2015  . Restlessness and agitation [R45.1] 01/25/2015  . STEMI (ST elevation myocardial infarction) [I21.3] 01/25/2015  . Cardiac arrest [I46.9] 01/21/2015    Total Time spent with patient: 1 hour  Subjective:   [name] [name] is a 54 y.o. male patient admitted with cardiac arrest.  HPI: [name] [name] is a 54 years old male seen face-to-face for the psychiatric evaluation, and reviewed available medical records and case discussed with the patient and his wife who was at bedside. Patient reported he has successfully completed cardiac catheterization this morning and then he was informed he has no blockages. Patient reported having a shoulder pain which lead to seizure and then unresponsive. Patient received extensive CPR before came to the emergency department. Reportedly patient suffered with the cardiac arrest and revived. Patient reported he was thankful to the god, and willing to follow up with the recommendations provided to him. Patient endorses history of polysubstance abuse including alcohol tobacco and cocaine. Patient reportedly IV cocaine use in many years ago and recently using cocaine once in a while last use was about a month ago, alcohol drinks 8 cans of beer every week or 2 weeks. He also smokes tobacco one and half packs per day the patient reportedly has a daughter and 2 grandchildren and his wife has a 2 sons and 5 grandchildren. Patient has a supportive family members. Patient has no previous history of acute psychiatric hospitalization patient denies  current suicidal/homicidal ideation intention or plan. Patient seems to have intact cognition including orientation, concentration, memory and language functions.    Medical History:54 year old male w/ remote h/o cocaine abuse. Per family had been doing heavy outdoor work the day prior. Then the am on 5/26 reported cc: left shoulder pain which he initially felt was d/t heavy work the day prior. Family then reports they walked back into room with him on floor w/ seizure like activity. EMS was called. No bystander CPR performed. On arrival rhythm was VF. ROSC estimated at 45 minutes w/ transient pulse return during this time. On arrival to ER he was over-breathing vent, had decorticate posturing. PCCM Admitted 5/26 . He was placed on cooling protocol and rewarmed on 5/28. He had improved mentation and following commands . CXR was initially suspious for aspiration PNA , cxr improved . He was placed on Unasyn IV. Sputum cx is pending . CT head showed no acute process , there questionable bilateral MCA obstruction with exam was not consistent with this. Echo showed CM and mild anteroseptal hypokinesis . He was continued on Hep drip and cards consider possible Left heart cath. Pt with angry outburst and combativeness, he left AMA on 5/30 early with RIJ CVL in place. Family and [employer] were contacted and pt returned to hospital around 4 hours later with aggreement to stay for further evaluation and treatment .     Past Medical History:  Past Medical History  Diagnosis Date  . Medical history non-contributory     Past Surgical History  Procedure Laterality Date  . No past surgeries     Family History: No family history on file. Social History:  History  Alcohol Use: Not on file     History  Drug Use Not on file    History   Social History  . Marital Status: Married    Spouse Name: N/A  . Number of Children: N/A  . Years of Education: N/A   Social History Main Topics  . Smoking status: Current  Every Day Smoker -- 1.00 packs/day for 35 years    Types: Cigarettes  . Smokeless tobacco: Not on file  . Alcohol Use: Not on file  . Drug Use: Not on file  . Sexual Activity: Yes   Other Topics Concern  . Not on file   Social History Narrative  . No narrative on file   Additional Social History:                          Allergies:  No Known Allergies  Labs:  Results for orders placed or performed during the hospital encounter of 01/25/15 (from the past 48 hour(s))  Comprehensive metabolic panel     Status: Abnormal   Collection Time: 01/25/15  4:00 PM  Result Value Ref Range   Sodium 134 (L) 135 - 145 mmol/L   Potassium 3.3 (L) 3.5 - 5.1 mmol/L   Chloride 102 101 - 111 mmol/L   CO2 28 22 - 32 mmol/L   Glucose, Bld 121 (H) 65 - 99 mg/dL   BUN 8 6 - 20 mg/dL   Creatinine, Ser 0.76 0.61 - 1.24 mg/dL   Calcium 8.1 (L) 8.9 - 10.3 mg/dL   Total Protein 6.0 (L) 6.5 - 8.1 g/dL   Albumin 3.0 (L) 3.5 - 5.0 g/dL   AST 396 (H) 15 - 41 U/L   ALT 136 (H) 17 - 63 U/L   Alkaline Phosphatase 64 38 - 126 U/L   Total Bilirubin 0.8 0.3 - 1.2 mg/dL   GFR calc non Af Amer >60 >60 mL/min   GFR calc Af Amer >60 >60 mL/min    Comment: (NOTE) The eGFR has been calculated using the CKD EPI equation. This calculation has not been validated in all clinical situations. eGFR's persistently <60 mL/min signify possible Chronic Kidney Disease.    Anion gap 4 (L) 5 - 15  CBC WITH DIFFERENTIAL     Status: Abnormal   Collection Time: 01/25/15  4:00 PM  Result Value Ref Range   WBC 13.2 (H) 4.0 - 10.5 K/uL   RBC 4.20 (L) 4.22 - 5.81 MIL/uL   Hemoglobin 11.6 (L) 13.0 - 17.0 g/dL   HCT 34.0 (L) 39.0 - 52.0 %   MCV 81.0 78.0 - 100.0 fL   MCH 27.6 26.0 - 34.0 pg   MCHC 34.1 30.0 - 36.0 g/dL   RDW 13.4 11.5 - 15.5 %   Platelets 134 (L) 150 - 400 K/uL   Neutrophils Relative % 72 43 - 77 %   Neutro Abs 9.5 (H) 1.7 - 7.7 K/uL   Lymphocytes Relative 16 12 - 46 %   Lymphs Abs 2.1 0.7 - 4.0  K/uL   Monocytes Relative 12 3 - 12 %   Monocytes Absolute 1.6 (H) 0.1 - 1.0 K/uL   Eosinophils Relative 0 0 - 5 %   Eosinophils Absolute 0.0 0.0 - 0.7 K/uL   Basophils Relative 0 0 - 1 %   Basophils Absolute 0.0 0.0 - 0.1 K/uL  Heparin level (unfractionated)     Status: Abnormal   Collection Time: 01/25/15 10:00 PM  Result Value  Ref Range   Heparin Unfractionated 0.17 (L) 0.30 - 0.70 IU/mL    Comment:        IF HEPARIN RESULTS ARE BELOW EXPECTED VALUES, AND PATIENT DOSAGE HAS BEEN CONFIRMED, SUGGEST FOLLOW UP TESTING OF ANTITHROMBIN III LEVELS.   Basic metabolic panel     Status: Abnormal   Collection Time: 01/26/15  5:01 AM  Result Value Ref Range   Sodium 135 135 - 145 mmol/L   Potassium 3.7 3.5 - 5.1 mmol/L   Chloride 104 101 - 111 mmol/L   CO2 25 22 - 32 mmol/L   Glucose, Bld 102 (H) 65 - 99 mg/dL   BUN 10 6 - 20 mg/dL   Creatinine, Ser 0.72 0.61 - 1.24 mg/dL   Calcium 8.1 (L) 8.9 - 10.3 mg/dL   GFR calc non Af Amer >60 >60 mL/min   GFR calc Af Amer >60 >60 mL/min    Comment: (NOTE) The eGFR has been calculated using the CKD EPI equation. This calculation has not been validated in all clinical situations. eGFR's persistently <60 mL/min signify possible Chronic Kidney Disease.    Anion gap 6 5 - 15  CBC     Status: Abnormal   Collection Time: 01/26/15  5:01 AM  Result Value Ref Range   WBC 14.3 (H) 4.0 - 10.5 K/uL   RBC 4.31 4.22 - 5.81 MIL/uL   Hemoglobin 11.9 (L) 13.0 - 17.0 g/dL   HCT 34.8 (L) 39.0 - 52.0 %   MCV 80.7 78.0 - 100.0 fL   MCH 27.6 26.0 - 34.0 pg   MCHC 34.2 30.0 - 36.0 g/dL   RDW 13.3 11.5 - 15.5 %   Platelets 152 150 - 400 K/uL  Heparin level (unfractionated)     Status: Abnormal   Collection Time: 01/26/15  5:01 AM  Result Value Ref Range   Heparin Unfractionated 0.28 (L) 0.30 - 0.70 IU/mL    Comment:        IF HEPARIN RESULTS ARE BELOW EXPECTED VALUES, AND PATIENT DOSAGE HAS BEEN CONFIRMED, SUGGEST FOLLOW UP TESTING OF ANTITHROMBIN  III LEVELS.   Glucose, capillary     Status: Abnormal   Collection Time: 01/26/15 12:14 PM  Result Value Ref Range   Glucose-Capillary 186 (H) 65 - 99 mg/dL    Vitals: Blood pressure 166/105, pulse 72, temperature 98.2 F (36.8 C), temperature source Oral, resp. rate 22, height 5' 9"  (1.753 m), weight 65.7 kg (144 lb 13.5 oz), SpO2 99 %.  Risk to Self: Is patient at risk for suicide?: No Risk to Others:   Prior Inpatient Therapy:   Prior Outpatient Therapy:    Current Facility-Administered Medications  Medication Dose Route Frequency Provider Last Rate Last Dose  . 0.9 %  sodium chloride infusion  250 mL Intravenous PRN Leonie Man, MD      . 0.9% sodium chloride infusion  3 mL/kg/hr Intravenous Continuous Leonie Man, MD 197.1 mL/hr at 01/26/15 1027 3 mL/kg/hr at 01/26/15 1027  . acetaminophen (TYLENOL) tablet 650 mg  650 mg Oral Q6H PRN Melvenia Needles, NP   650 mg at 01/26/15 0647   Or  . acetaminophen (TYLENOL) suppository 650 mg  650 mg Rectal Q6H PRN Tammy S Parrett, NP      . amoxicillin-clavulanate (AUGMENTIN) 875-125 MG per tablet 1 tablet  1 tablet Oral Q12H Elsie Stain, MD   1 tablet at 01/25/15 2244  . aspirin EC tablet 81 mg  81 mg Oral Daily Tammy S  Parrett, NP   81 mg at 01/26/15 0834  . atorvastatin (LIPITOR) tablet 80 mg  80 mg Oral q1800 Tammy S Parrett, NP   80 mg at 01/25/15 1705  . haloperidol lactate (HALDOL) injection 5 mg  5 mg Intramuscular Q6H PRN Tammy S Parrett, NP      . heparin injection 5,000 Units  5,000 Units Subcutaneous 3 times per day Leonie Man, MD      . hydrALAZINE (APRESOLINE) injection 10 mg  10 mg Intravenous Q6H PRN Brett Canales, PA-C   10 mg at 01/26/15 1234  . insulin aspart (novoLOG) injection 0-15 Units  0-15 Units Subcutaneous TID WC Melvenia Needles, NP   3 Units at 01/26/15 1232  . insulin aspart (novoLOG) injection 0-5 Units  0-5 Units Subcutaneous QHS Tammy S Parrett, NP      . metoprolol tartrate (LOPRESSOR) tablet  12.5 mg  12.5 mg Oral BID Tammy S Parrett, NP   12.5 mg at 01/26/15 1232  . ondansetron (ZOFRAN) tablet 4 mg  4 mg Oral Q6H PRN Tammy S Parrett, NP       Or  . ondansetron (ZOFRAN) injection 4 mg  4 mg Intravenous Q6H PRN Tammy S Parrett, NP      . senna-docusate (Senokot-S) tablet 1 tablet  1 tablet Oral QHS PRN Tammy S Parrett, NP      . sodium chloride 0.9 % injection 3 mL  3 mL Intravenous Q12H Tammy S Parrett, NP   3 mL at 01/25/15 2228  . sodium chloride 0.9 % injection 3 mL  3 mL Intravenous Q12H Leonie Man, MD      . sodium chloride 0.9 % injection 3 mL  3 mL Intravenous PRN Leonie Man, MD        Musculoskeletal: Strength & Muscle Tone: decreased Gait & Station: unable to stand Patient leans: N/A  Psychiatric Specialty Exam: Physical Exam  ROS patient complains about breathing difficulties, generalized chest pain and weakness. No Fever-chills, No Headache, No changes with Vision or hearing, reports vertigo No problems swallowing food or Liquids, No Chest pain, Cough or Shortness of Breath, No Abdominal pain, No Nausea or Vommitting, Bowel movements are regular, No Blood in stool or Urine, No dysuria, No new skin rashes or bruises, No new joints pains-aches,  No new weakness, tingling, numbness in any extremity, No recent weight gain or loss, No polyuria, polydypsia or polyphagia,   A full 10 point Review of Systems was done, except as stated above, all other Review of Systems were negative.  Blood pressure 166/105, pulse 72, temperature 98.2 F (36.8 C), temperature source Oral, resp. rate 22, height 5' 9"  (1.753 m), weight 65.7 kg (144 lb 13.5 oz), SpO2 99 %.Body mass index is 21.38 kg/(m^2).  General Appearance: Casual  Eye Contact::  Good  Speech:  Clear and Coherent  Volume:  Normal  Mood:  Anxious  Affect:  Appropriate and Congruent  Thought Process:  Coherent and Goal Directed  Orientation:  Full (Time, Place, and Person)  Thought Content:  WDL   Suicidal Thoughts:  No  Homicidal Thoughts:  No  Memory:  Immediate;   Good Recent;   Good  Judgement:  Intact  Insight:  Good  Psychomotor Activity:  Decreased  Concentration:  Good  Recall:  Good  Fund of Knowledge:Good  Language: Good  Akathisia:  Negative  Handed:  Right  AIMS (if indicated):     Assets:  Communication Skills Desire for Improvement Financial  Resources/Insurance Housing Intimacy Leisure Time Resilience Social Support Heritage manager  ADL's:  Intact  Cognition: WNL  Sleep:      Medical Decision Making: New problem, with additional work up planned, Review of Psycho-Social Stressors (1), Review or order clinical lab tests (1), Review or order medicine tests (1), Review of Medication Regimen & Side Effects (2) and Review of New Medication or Change in Dosage (2)  Treatment Plan Summary: Daily contact with patient to assess and evaluate symptoms and progress in treatment and Medication management  Plan: Patient meet criteria for capacity to make his own medical decision and living arrangements. Patient is willing to follow up with the recommendations provided to him Leanor Kail - offer cessation treatment Alcohol abuse - substance abuse counseling Cocaine abuse -substance abuse counseling  Patient does not meet criteria for psychiatric inpatient admission. Supportive therapy provided about ongoing stressors.  Refer to the psychiatric social service for possible outpatient substance abuse treatment Appreciate psychiatric consultation and we sign off at this time Please contact [phone] or [phone] if needs further assistance   Disposition: Patient can be discharged to home, refer to the outpatient medication management and medically stable.  Kizzy Olafson,JANARDHAHA R. 01/26/2015 12:43 PM

## 2015-01-26 NOTE — Progress Notes (Signed)
Security arrived, also called house coverage Denyse Amass to notify what is going on  [id number] team arrived to removed central line, house coverage also arrived.

## 2015-01-26 NOTE — Progress Notes (Signed)
  Psych consult received for capacity evaluation today. Patient is currently in Cath lab. Psych will follow him later or May call us when patient is available for the evaluation.    [name] [name],[name] R. 01/26/2015 10:45 AM

## 2015-01-27 ENCOUNTER — Encounter (HOSPITAL_COMMUNITY): Payer: Self-pay | Admitting: General Practice

## 2015-01-27 ENCOUNTER — Telehealth: Payer: Self-pay | Admitting: Internal Medicine

## 2015-01-27 ENCOUNTER — Encounter (HOSPITAL_COMMUNITY): Payer: Self-pay | Admitting: Emergency Medicine

## 2015-01-27 DIAGNOSIS — F17200 Nicotine dependence, unspecified, uncomplicated: Secondary | ICD-10-CM

## 2015-01-27 DIAGNOSIS — I469 Cardiac arrest, cause unspecified: Secondary | ICD-10-CM

## 2015-01-27 DIAGNOSIS — R079 Chest pain, unspecified: Secondary | ICD-10-CM

## 2015-01-27 DIAGNOSIS — F1210 Cannabis abuse, uncomplicated: Secondary | ICD-10-CM

## 2015-01-27 DIAGNOSIS — I428 Other cardiomyopathies: Secondary | ICD-10-CM

## 2015-01-27 DIAGNOSIS — B1920 Unspecified viral hepatitis C without hepatic coma: Secondary | ICD-10-CM

## 2015-01-27 LAB — CBC
HCT: 34.9 % — ABNORMAL LOW (ref 39.0–52.0)
HCT: 34.4 % — ABNORMAL LOW (ref 39.0–52.0)
HEMOGLOBIN: 12.1 g/dL — AB (ref 13.0–17.0)
HEMOGLOBIN: 11.7 g/dL — AB (ref 13.0–17.0)
MCH: 27.7 pg (ref 26.0–34.0)
MCH: 27.3 pg (ref 26.0–34.0)
MCHC: 34.7 g/dL (ref 30.0–36.0)
MCHC: 34 g/dL (ref 30.0–36.0)
MCV: 79.9 fL (ref 78.0–100.0)
MCV: 80.2 fL (ref 78.0–100.0)
PLATELETS: 173 10*3/uL (ref 150–400)
Platelets: 171 10*3/uL (ref 150–400)
RBC: 4.37 MIL/uL (ref 4.22–5.81)
RBC: 4.29 MIL/uL (ref 4.22–5.81)
RDW: 13.3 % (ref 11.5–15.5)
RDW: 13.6 % (ref 11.5–15.5)
WBC: 11.9 10*3/uL — AB (ref 4.0–10.5)
WBC: 11.2 10*3/uL — ABNORMAL HIGH (ref 4.0–10.5)

## 2015-01-27 LAB — TROPONIN I
TROPONIN I: 0.1 ng/mL — AB (ref <0.031)
Troponin I: 0.07 ng/mL — ABNORMAL HIGH (ref <0.031)
Troponin I: 0.06 ng/mL — ABNORMAL HIGH (ref <0.031)

## 2015-01-27 LAB — COMPREHENSIVE METABOLIC PANEL
ALK PHOS: 53 U/L (ref 38–126)
ALT: 105 U/L — ABNORMAL HIGH (ref 17–63)
AST: 147 U/L — ABNORMAL HIGH (ref 15–41)
Albumin: 2.9 g/dL — ABNORMAL LOW (ref 3.5–5.0)
Anion gap: 10 (ref 5–15)
BILIRUBIN TOTAL: 0.5 mg/dL (ref 0.3–1.2)
BUN: 9 mg/dL (ref 6–20)
CO2: 22 mmol/L (ref 22–32)
Calcium: 8.4 mg/dL — ABNORMAL LOW (ref 8.9–10.3)
Chloride: 106 mmol/L (ref 101–111)
Creatinine, Ser: 0.72 mg/dL (ref 0.61–1.24)
GFR calc Af Amer: >60 mL/min (ref >60)
GFR calc non Af Amer: >60 mL/min (ref >60)
Glucose, Bld: 141 mg/dL — ABNORMAL HIGH (ref 65–99)
POTASSIUM: 3.4 mmol/L — AB (ref 3.5–5.1)
SODIUM: 138 mmol/L (ref 135–145)
Total Protein: 5.5 g/dL — ABNORMAL LOW (ref 6.5–8.1)

## 2015-01-27 LAB — CREATININE, SERUM
Creatinine, Ser: 0.7 mg/dL (ref 0.61–1.24)
GFR calc non Af Amer: >60 mL/min (ref >60)

## 2015-01-27 LAB — GLUCOSE, CAPILLARY: Glucose-Capillary: 86 mg/dL (ref 65–99)

## 2015-01-27 MED ORDER — NITROGLYCERIN 0.4 MG SL SUBL: 0.4000 mg | SUBLINGUAL_TABLET | SUBLINGUAL | Status: DC | PRN

## 2015-01-27 MED ORDER — VITAMIN B-1 100 MG PO TABS
100.0000 mg | ORAL_TABLET | Freq: Every day | ORAL | Status: DC
  Administered 2015-01-27 – 2015-01-30 (×4): 100 mg via ORAL
  Filled 2015-01-27 (×4): qty 1

## 2015-01-27 MED ORDER — SODIUM CHLORIDE 0.9 % IV SOLN
INTRAVENOUS | Status: DC
  Administered 2015-01-27: 02:00:00 via INTRAVENOUS

## 2015-01-27 MED ORDER — ACETAMINOPHEN 325 MG PO TABS
650.0000 mg | ORAL_TABLET | Freq: Four times a day (QID) | ORAL | Status: DC | PRN
  Administered 2015-01-28: 650 mg via ORAL
  Filled 2015-01-27: qty 2

## 2015-01-27 MED ORDER — POLYETHYLENE GLYCOL 3350 17 G PO PACK
17.0000 g | PACK | Freq: Every day | ORAL | Status: DC | PRN
  Filled 2015-01-27 (×2): qty 1

## 2015-01-27 MED ORDER — ADULT MULTIVITAMIN W/MINERALS CH
1.0000 | ORAL_TABLET | Freq: Every day | ORAL | Status: DC
  Administered 2015-01-27 – 2015-01-30 (×3): 1 via ORAL
  Filled 2015-01-27 (×4): qty 1

## 2015-01-27 MED ORDER — ASPIRIN 81 MG PO TABS: 81.0000 mg | ORAL_TABLET | Freq: Every day | ORAL | Status: DC

## 2015-01-27 MED ORDER — ACETAMINOPHEN 650 MG RE SUPP: 650.0000 mg | Freq: Four times a day (QID) | RECTAL | Status: DC | PRN

## 2015-01-27 MED ORDER — MORPHINE SULFATE 2 MG/ML IJ SOLN
1.0000 mg | INTRAMUSCULAR | Status: DC | PRN
  Administered 2015-01-27 – 2015-01-29 (×6): 1 mg via INTRAVENOUS
  Filled 2015-01-27 (×4): qty 1

## 2015-01-27 MED ORDER — ASPIRIN 81 MG PO CHEW
81.0000 mg | CHEWABLE_TABLET | Freq: Every day | ORAL | Status: DC
  Administered 2015-01-27 – 2015-01-30 (×4): 81 mg via ORAL
  Filled 2015-01-27 (×4): qty 1

## 2015-01-27 MED ORDER — SODIUM CHLORIDE 0.9 % IJ SOLN
3.0000 mL | Freq: Two times a day (BID) | INTRAMUSCULAR | Status: DC
  Administered 2015-01-27 – 2015-01-30 (×7): 3 mL via INTRAVENOUS

## 2015-01-27 MED ORDER — FLECAINIDE ACETATE 100 MG PO TABS
300.0000 mg | ORAL_TABLET | Freq: Once | ORAL | Status: AC
  Administered 2015-01-27: 300 mg via ORAL
  Filled 2015-01-27: qty 3

## 2015-01-27 MED ORDER — ONDANSETRON HCL 4 MG/2ML IJ SOLN: 4.0000 mg | Freq: Four times a day (QID) | INTRAMUSCULAR | Status: DC | PRN

## 2015-01-27 MED ORDER — MORPHINE SULFATE 2 MG/ML IJ SOLN
INTRAMUSCULAR | Status: AC
  Filled 2015-01-27: qty 1

## 2015-01-27 MED ORDER — FOLIC ACID 1 MG PO TABS
1.0000 mg | ORAL_TABLET | Freq: Every day | ORAL | Status: DC
  Administered 2015-01-27 – 2015-01-30 (×4): 1 mg via ORAL
  Filled 2015-01-27 (×4): qty 1

## 2015-01-27 MED ORDER — ONDANSETRON HCL 4 MG PO TABS: 4.0000 mg | ORAL_TABLET | Freq: Four times a day (QID) | ORAL | Status: DC | PRN

## 2015-01-27 MED ORDER — ENOXAPARIN SODIUM 40 MG/0.4ML ~~LOC~~ SOLN
40.0000 mg | SUBCUTANEOUS | Status: DC
  Administered 2015-01-27 – 2015-01-29 (×3): 40 mg via SUBCUTANEOUS
  Filled 2015-01-27 (×4): qty 0.4

## 2015-01-27 MED ORDER — MORPHINE SULFATE 2 MG/ML IJ SOLN
1.0000 mg | INTRAMUSCULAR | Status: DC | PRN
  Administered 2015-01-27 (×3): 1 mg via INTRAVENOUS
  Filled 2015-01-27 (×3): qty 1

## 2015-01-27 MED ORDER — HEPARIN SODIUM (PORCINE) 5000 UNIT/ML IJ SOLN
5000.0000 [IU] | Freq: Three times a day (TID) | INTRAMUSCULAR | Status: DC
  Administered 2015-01-27 (×2): 5000 [IU] via SUBCUTANEOUS
  Filled 2015-01-27 (×5): qty 1

## 2015-01-27 MED FILL — Lidocaine HCl Local Preservative Free (PF) Inj 1%: INTRAMUSCULAR | Qty: 30 | Status: AC

## 2015-01-27 MED FILL — Heparin Sodium (Porcine) 2 Unit/ML in Sodium Chloride 0.9%: INTRAMUSCULAR | Qty: 1500 | Status: AC

## 2015-01-27 NOTE — Consult Note (Signed)
ELECTROPHYSIOLOGY CONSULT NOTE    Patient ID: [name] [name] MRN: [id number], DOB/AGE: 1960/11/04 54 y.o.  Admit date: 01/26/2015 Date of Consult: 01/27/2015  Primary Physician: Otho Bellows, MD Primary Cardiologist: Johnsie Cancel - new 01/21/15 admission  Reason for Consultation: VF arrest  HPI:  [name] [name] is a 54 y.o. male with a past medical history significant for Hepatitis C, polysubstance abuse and PTSD who presented on 01/21/15 with VF arrest.  He had finished mowing and his family noticed questionable seizure activity.  EMS was called and he received multiple rounds of epi and shocks with an estimated downtime of 30 minutes.  He was transferred to [facility] and was cooled. After he awakened and was extubated, he was agitated and left AMA with a central line in place.  He then returned later that day and agreed to be readmitted.  He underwent catheterization which demonstrated no CAD and a preserved EF.  He then again left AMA last night.  He returned again this morning and EP has been asked to evaluate.\\  He has no prior history of syncope. There is no family history of sudden death. He has no history of palpitations. Exercise tolerance has been reasonable.   Echocardiogram 01/22/15 demonstrated EF 35%, EF by cath was normal. Lab work is notable for UDS positive for THC and benzo, troponin peak of 3.44, lactic acid of 10.65 on admission, glucose 436 on arrival, K 3.1.   Past Medical History  Diagnosis Date  . Cardiac arrest 12/2014  . Hepatitis C     history   . PULMONARY NODULE 12/21/2009    Stable 34mm per repeat CXR 4/11  . Hepatitis C     S/p completing treatment at [facility]  . PTSD (post-traumatic stress disorder)     Found deceased brother- resolved per pt  . Tobacco abuse     1 ppd x 22 ys. Quit 04/22/12  . IV drug abuse     Quit. Used in the 43s  . Depression     Resolved- pt contributes to previous drug use  . Anemia   . Substance abuse   . Hemorrhoids      Surgical  History:  Past Surgical History  Procedure Laterality Date  . Cardiac catheterization  01/26/2015  . Cardiac catheterization N/A 01/26/2015    Procedure: Left Heart Cath and Coronary Angiography;  Surgeon: Leonie Man, MD;  Location: [facility];  Service: Cardiovascular;  Laterality: N/A;  . Fracture surgery Left     In high school  . Cyst removal neck  2011  . Multiple tooth extractions  1999  . Wisdom tooth extraction       Prescriptions prior to admission  Medication Sig Dispense Refill Last Dose  . amoxicillin-clavulanate (AUGMENTIN) 875-125 MG per tablet Take 1 tablet by mouth 2 (two) times daily.   01/26/2015  . aspirin 81 MG tablet Take 81 mg by mouth daily.   Past Week at Unknown time  . aspirin EC 81 MG tablet Take 81 mg by mouth daily.   01/22/2015  . atorvastatin (LIPITOR) 80 MG tablet Take 80 mg by mouth daily at 6 PM.   01/25/2015  . ibuprofen (ADVIL,MOTRIN) 200 MG tablet Take 200 mg by mouth every 6 (six) hours as needed for pain.   Past Week at Unknown time  . metoprolol tartrate (LOPRESSOR) 25 MG tablet Take 12.5 mg by mouth 2 (two) times daily.   01/26/2015 at 1000  . Multiple Vitamin (MULTIVITAMIN  WITH MINERALS) TABS tablet Take 1 tablet by mouth daily.   Past Week at Unknown time  . Multiple Vitamins-Minerals (MULTIVITAMIN ADULTS 50+) TABS Take 1 tablet by mouth daily.   01/22/2015  . nitroGLYCERIN (NITROSTAT) 0.4 MG SL tablet Place 0.4 mg under the tongue every 5 (five) minutes as needed for chest pain.   01/26/2015 at Unknown time    Inpatient Medications:  . aspirin  81 mg Oral Daily  . folic acid  1 mg Oral Daily  . heparin  5,000 Units Subcutaneous 3 times per day  . multivitamin with minerals  1 tablet Oral Daily  . sodium chloride  3 mL Intravenous Q12H  . thiamine  100 mg Oral Daily    Allergies: No Known Allergies  History   Social History  . Marital Status: Married    Spouse Name: N/A  . Number of Children: N/A  . Years of Education: N/A    Occupational History  . Not on file.   Social History Main Topics  . Smoking status: Current Every Day Smoker -- 0.50 packs/day for 29 years    Types: Cigarettes  . Smokeless tobacco: Never Used     Comment: Stopped x 3-4 days  . Alcohol Use: 3.6 oz/week    6 Cans of beer per week     Comment: occassional  . Drug Use: Yes    Special: "Crack" cocaine, Marijuana, Heroin     Comment: Quit IV drugs in the '80s. Last crack use 2009 or 2010.  Marland Kitchen Sexual Activity:    Partners: Female   Other Topics Concern  . Not on file   Social History Narrative   ** Merged History Encounter **         Family History  Problem Relation Age of Onset  . Hyperlipidemia Mother   . Hypertension Mother   . COPD Mother   . Colon polyps Mother 34  . Heart disease Father     Decease from MI at 41yo  . Cancer Maternal Grandfather     Colon cancer, deceased  . Alcohol abuse Maternal Grandfather   . Colon cancer Maternal Grandfather 80  . Esophageal cancer Neg Hx   . Rectal cancer Neg Hx      Review of Systems: General: No chills, fever, night sweats or weight changes  Cardiovascular:  No chest pain, dyspnea on exertion, edema, orthopnea, palpitations, paroxysmal nocturnal dyspnea Dermatological: No rash, lesions or masses Respiratory: No cough, dyspnea Urologic: No hematuria, dysuria Abdominal: No nausea, vomiting, diarrhea, bright red blood per rectum, melena, or hematemesis Neurologic: No visual changes, weakness, changes in mental status All other systems reviewed and are otherwise negative except as noted above.  Physical Exam: Filed Vitals:   01/27/15 0015 01/27/15 0016 01/27/15 0046 01/27/15 0630  BP: 158/84  141/82 144/80  Pulse: 48 79 74 74  Temp:   99.7 F (37.6 C) 98.7 F (37.1 C)  TempSrc:   Oral Oral  Resp: 24  21 18   Height:   5\' 10"  (1.778 m)   Weight:   143 lb 15.4 oz (65.3 kg)   SpO2: 97%  98% 97%    GEN- The patient is well appearing, alert and oriented x 3 today.    HEENT: normocephalic, atraumatic; sclera clear, conjunctiva pink; hearing intact; oropharynx clear; neck supple, no JVP Lymph- no cervical lymphadenopathy Lungs- Clear to ausculation bilaterally, normal work of breathing.  No wheezes, rales, rhonchi Heart- Regular rate and rhythm, 2/6 murmur  No rubs or  gallops, PMI not laterally displaced GI- soft, non-tender, non-distended, bowel sounds present, no hepatosplenomegaly Extremities- no clubbing, cyanosis, or edema; DP/PT/radial pulses 2+ bilaterally MS- no significant deformity or atrophy Skin- warm and dry, no rash or lesion Psych- euthymic mood, full affect Neuro- strength and sensation are intact  Labs:   Lab Results  Component Value Date   WBC 11.2* 01/27/2015   HGB 11.7* 01/27/2015   HCT 34.4* 01/27/2015   MCV 80.2 01/27/2015   PLT 171 01/27/2015    Recent Labs Lab 01/27/15 0825  NA 138  K 3.4*  CL 106  CO2 22  BUN 9  CREATININE 0.72  CALCIUM 8.4*  PROT 5.5*  BILITOT 0.5  ALKPHOS 53  ALT 105*  AST 147*  GLUCOSE 141*      Radiology/Studies: Dg Chest 2 View 01/26/2015   CLINICAL DATA:  Chest pain for 5 days, heart catheterization yesterday  EXAM: CHEST  2 VIEW  COMPARISON:  04/01/2013  FINDINGS: Cardiac shadow is within normal limits. Minimal left basilar atelectasis is seen. No sizable effusion or focal infiltrate is noted. No acute bony abnormality is seen.  IMPRESSION: Mild left basilar atelectasis.   Electronically Signed   By: Inez Catalina M.D.   On: 01/26/2015 21:54   EBX:IDHWY rhythm, rate 69, normal intervals  TELEMETRY: sinus rhythm   Active Problems:   Cardiac arrest   The patient has a cardiac arrest in the context of normal LV function. ECG is unrevealing. Still however the differential includes channelopathies including Brugada syndrome, long QT syndrome, CPVT.  In the absence of a triggering event, the appropriate therapy is ICD implantation.  We have discussed the relative benefits and merits of  transvenous versus subcutaneous ICD implantation.  The advantages of the former including battery longevity, history derived from use in randomized controlled trials and perhaps a somewhat lower rate of inappropriate ICD discharges. Advantages of the latter  include the fact that it is extravascular,  Resulting different implications of device infection and it being non-transvalvular.     He would like to pursue S ICD insertion. We will have him mapped.  Tonight we will give him flecainide and look for evidence of Brugada syndrome  Signal average ECG  Cardiac MR to look for evidence of intrinsic myocardial pathology  Epinephrine infusion to look for LQT 1

## 2015-01-27 NOTE — Progress Notes (Signed)
Chart briefly reviewed: Patient signed out [place] on 01/26/15 and returned a few hours later. He had been hospitalized for V. fib cardiac arrest, EMS was called and he received multiple rounds of epinephrine and shocks with estimated down time of 30 minutes. He was admitted by critical care service, intubated/extubated & cooled. EPS consultation was pending but patient left AMA with central line in place.  Discussed with patient, his spouse and daughter at bedside. They state that his PCP is with the [facility] Internal Medicine teaching program-Dr. Eulas Post. He complains of midsternal chest pain at site of CPR/shocking which is worse with touching or deep inspiration. He denies any other complaints. Appears comfortable and in no obvious distress. Vital signs and physical exam unremarkable except for mid sternal burn marks and tenderness. Right IJ is out. Lab work shows mild hypokalemia, minimally elevated troponin and anemia. I had called cardiology consultation this AM.  Since patient's PCP is with [facility] IM teaching service, discussed with Dr. Naaman Plummer who has kindly accepted patient's care on to their service and will see patient and manage beginning 01/27/15. I have not placed any orders. I have discussed with patient and family regarding transfer of care and they verbalize understanding and agreement. TRH will sign off at this time. Please contact us for any further assistance.  Vernell Leep, MD, FACP, FHM. [facility] Pager [phone]  If 7PM-7AM, please contact night-coverage [email or web address] Password [facility] 01/27/2015, 4:34 PM

## 2015-01-27 NOTE — Progress Notes (Signed)
Subjective:  Pt seen and examined in AM. No acute events overnight. He is transferred from hospitalist service today. He was hospitalized for sudden cardiac arrest on 5/26 after heavy lifting at work and found to have possible seizure activity by his family. He required 45 minutes of CPR/defrbillation before ROSC requiring ICU admission with hypothermic protocol. His mental status improved. He had left heart catherization with no evidence of CAD and preserved EF however echo with EF of 30-35%. He left AMA on 5/30 but returned last night.    Pt reports he has mild chest pain that is worse with coughing due to recent chest compressions and defibrillation. He denies dyspnea, palpitations, lightheadedness, or syncope.   He has family history of MI on paternal grandmother and father had open heart surgery.    Objective: Vital signs in last 24 hours: Filed Vitals:   01/27/15 0016 01/27/15 0046 01/27/15 0630 01/27/15 1327  BP:  141/82 144/80 146/78  Pulse: 79 74 74 74  Temp:  99.7 F (37.6 C) 98.7 F (37.1 C) 98 F (36.7 C)  TempSrc:  Oral Oral Oral  Resp:  21 18 18   Height:  5\' 10"  (1.778 m)    Weight:  143 lb 15.4 oz (65.3 kg)    SpO2:  98% 97% 98%   Weight change:   Intake/Output Summary (Last 24 hours) at 01/27/15 1616 Last data filed at 01/27/15 1230  Gross per 24 hour  Intake 969.17 ml  Output   1250 ml  Net -280.83 ml   PHYSICAL EXAMINATION:  General: NAD  Heart: Regular rate and rhythm with no murmurs   Lungs: Clear to auscultation with no wheezing, ronchi, or rales.  Abdomen: Soft, non-tender, non-distended, normal BS Extremities: No edema  Neuro: A & O x 3  Lab Results: Basic Metabolic Panel:  Recent Labs Lab 01/22/15 0820  01/26/15 2130 01/27/15 0248 01/27/15 0825  NA 137  < > 137  --  138  K 3.4*  < > 3.4*  --  3.4*  CL 112*  < > 105  --  106  CO2 15*  < > 23  --  22  GLUCOSE 124*  < > 116*  --  141*  BUN 12  < > 9  --  9  CREATININE 0.59*  < > 0.73  0.70 0.72  CALCIUM 8.1*  < > 8.7*  --  8.4*  MG 1.8  --   --   --   --   < > = values in this interval not displayed. Liver Function Tests:  Recent Labs Lab 01/25/15 1600 01/27/15 0825  AST 396* 147*  ALT 136* 105*  ALKPHOS 64 53  BILITOT 0.8 0.5  PROT 6.0* 5.5*  ALBUMIN 3.0* 2.9*    CBC:  Recent Labs Lab 01/24/15 0400  01/25/15 1600  01/27/15 0248 01/27/15 0825  WBC 18.8*  < > 13.2*  < > 11.9* 11.2*  NEUTROABS 15.5*  --  9.5*  --   --   --   HGB 11.1*  < > 11.6*  < > 12.1* 11.7*  HCT 31.7*  < > 34.0*  < > 34.9* 34.4*  MCV 80.7  < > 81.0  < > 79.9 80.2  PLT 95*  < > 134*  < > 173 171  < > = values in this interval not displayed. Cardiac Enzymes:  Recent Labs Lab 01/27/15 0248 01/27/15 0825 01/27/15 1226  TROPONINI 0.10* 0.07* 0.06*   CBG:  Recent Labs  Lab 01/25/15 1338 01/25/15 1708 01/25/15 2227 01/26/15 0833 01/26/15 1214 01/27/15 0627  GLUCAP 119* 98 143* 105* 186* 86    Coagulation:  Recent Labs Lab 01/21/15 0830 01/21/15 1642  LABPROT 17.3* 16.4*  INR 1.40 1.31   Urine Drug Screen: Drugs of Abuse     Component Value Date/Time   LABOPIA NONE DETECTED 01/21/2015 0915   LABOPIA NEG 02/11/2010 2105   COCAINSCRNUR NONE DETECTED 01/21/2015 0915   COCAINSCRNUR NEG 02/11/2010 2105   LABBENZ POSITIVE* 01/21/2015 0915   LABBENZ NEG 02/11/2010 2105   AMPHETMU NONE DETECTED 01/21/2015 0915   AMPHETMU NEG 02/11/2010 2105   THCU POSITIVE* 01/21/2015 0915   LABBARB NONE DETECTED 01/21/2015 0915     Micro Results: Recent Results (from the past 240 hour(s))  MRSA PCR Screening     Status: None   Collection Time: 01/21/15 11:17 AM  Result Value Ref Range Status   MRSA by PCR NEGATIVE NEGATIVE Final    Comment:        The GeneXpert MRSA Assay (FDA approved for NASAL specimens only), is one component of a comprehensive MRSA colonization surveillance program. It is not intended to diagnose MRSA infection nor to guide or monitor treatment  for MRSA infections.    Studies/Results: Dg Chest 2 View  01/26/2015   CLINICAL DATA:  Chest pain for 5 days, heart catheterization yesterday  EXAM: CHEST  2 VIEW  COMPARISON:  04/01/2013  FINDINGS: Cardiac shadow is within normal limits. Minimal left basilar atelectasis is seen. No sizable effusion or focal infiltrate is noted. No acute bony abnormality is seen.  IMPRESSION: Mild left basilar atelectasis.   Electronically Signed   By: Inez Catalina M.D.   On: 01/26/2015 21:54   Medications: I have reviewed the patient's current medications. Scheduled Meds: . aspirin  81 mg Oral Daily  . flecainide  300 mg Oral Once  . folic acid  1 mg Oral Daily  . heparin  5,000 Units Subcutaneous 3 times per day  . multivitamin with minerals  1 tablet Oral Daily  . sodium chloride  3 mL Intravenous Q12H  . thiamine  100 mg Oral Daily   Continuous Infusions: . sodium chloride 50 mL/hr at 01/27/15 0139   PRN Meds:.acetaminophen **OR** acetaminophen, morphine injection, nitroGLYCERIN, ondansetron **OR** ondansetron (ZOFRAN) IV, polyethylene glycol Assessment/Plan:  Sudden Vfib Arrest with Nonischemic Cardiomyopathy - Currently with mild CP in setting of extended CPR of 45 minutes. Left heart catherization with clean coronary arteries and normal LV EF. Low EF 35% on 2D-echo. Etiology unclear, possibly due to channelopathy per cardiology. QTc not prolonged on 12-lead EKG.  -Appreciate cardiology recommendations   -IV morphine 1 mg Q 4 hr PRN pain -Continue aspirin 81 mg daily for secondary CVD prevention -Will add carvedilol and lisinopril for reduced EF after diagnostic studies completed, avoid CCB -Obtain lipid panel, TSH, and A1c -To have subcutaneous ICD placed on Friday with mapping completed today -Flecainide to assess for Brugada syndrome  -Epinephrine infusion to assess for long QT syndrome  -Cardiac MRI to assess for evidence of intrinsic myocardial pathology -Repeat 2D-echo in 3 months to  reasess EF  Transaminitis in setting of treated HCV infection and ethanol use - AST/ALT 147/105. Pt has been treated for HCV infection at [facility]. Pt reports socially drinking alcohol (6 cans per week).  -Obtain HIV Ab and HCV viral load  -Administer Hepatitis A & B vaccination if not immune -Consider abdominal US to assess for cirrhosis  -Multivitamin, folic acid,  and thiamine daily   Marijuana Use - Pt with UDS positive for THC. -Counsel on cessation   Tobacco Abuse - Pt reports smoking 1.5 ppd since age 64. Pt reports he is going to quit smoking. -Counsel on cessation   Dispo: Disposition is deferred at this time, awaiting improvement of current medical problems.  Anticipated discharge in approximately 2-3 day(s).   The patient does have a current PCP Otho Bellows, MD) and does need an [facility] hospital follow-up appointment after discharge.  The patient does not have transportation limitations that hinder transportation to clinic appointments.  .Services Needed at time of discharge: Y = Yes, Blank = No PT:   OT:   RN:   Equipment:   Other:       Juluis Mire, MD 01/27/2015, 4:16 PM

## 2015-01-27 NOTE — Telephone Encounter (Signed)
Call to patient to confirm appointment for 01/28/15 at 2:15 lmtcb

## 2015-01-27 NOTE — Progress Notes (Signed)
Bubba Hales, RN Registered Nurse Signed  Progress Notes 01/27/2015 3:07 PM    Expand All Collapse All   Pt c/o roof of mouth soreness (pt states it may be from when he was intubated), small area of roof of mouth is red and irritated. Pt has dentures in which are covering the area, encouraged pt to leave top dentures out for a while to allow roof of mouth to be without anything against it. Will have pt rinse mouth with saline solution.   MD please advise if needs further intervention.

## 2015-01-28 ENCOUNTER — Encounter: Payer: Self-pay | Admitting: Internal Medicine

## 2015-01-28 ENCOUNTER — Encounter (HOSPITAL_COMMUNITY)
Admission: EM | Disposition: A | Discharge status: Home / Self Care | Payer: Self-pay | Source: Home / Self Care | Attending: Internal Medicine

## 2015-01-28 ENCOUNTER — Encounter (HOSPITAL_COMMUNITY): Payer: Self-pay | Admitting: Cardiology

## 2015-01-28 ENCOUNTER — Inpatient Hospital Stay (HOSPITAL_COMMUNITY): Payer: Self-pay

## 2015-01-28 DIAGNOSIS — E876 Hypokalemia: Secondary | ICD-10-CM | POA: Diagnosis present

## 2015-01-28 DIAGNOSIS — I469 Cardiac arrest, cause unspecified: Secondary | ICD-10-CM

## 2015-01-28 LAB — BASIC METABOLIC PANEL
Anion gap: 9 (ref 5–15)
BUN: 7 mg/dL (ref 6–20)
CO2: 24 mmol/L (ref 22–32)
CREATININE: 0.72 mg/dL (ref 0.61–1.24)
Calcium: 8.4 mg/dL — ABNORMAL LOW (ref 8.9–10.3)
Chloride: 105 mmol/L (ref 101–111)
GFR calc Af Amer: >60 mL/min (ref >60)
Glucose, Bld: 92 mg/dL (ref 65–99)
Potassium: 3.3 mmol/L — ABNORMAL LOW (ref 3.5–5.1)
Sodium: 138 mmol/L (ref 135–145)

## 2015-01-28 LAB — HIV ANTIBODY (ROUTINE TESTING W REFLEX): HIV SCREEN 4TH GENERATION: NONREACTIVE

## 2015-01-28 LAB — LIPID PANEL
CHOL/HDL RATIO: 4.8 ratio
Cholesterol: 126 mg/dL (ref 0–200)
HDL: 26 mg/dL — ABNORMAL LOW (ref >40)
LDL CALC: 82 mg/dL (ref 0–99)
Triglycerides: 91 mg/dL (ref <150)
VLDL: 18 mg/dL (ref 0–40)

## 2015-01-28 LAB — TSH: TSH: 3.3 u[IU]/mL (ref 0.350–4.500)

## 2015-01-28 LAB — MAGNESIUM: MAGNESIUM: 1.9 mg/dL (ref 1.7–2.4)

## 2015-01-28 SURGERY — CARDIOVERSION (CATH LAB)
Anesthesia: LOCAL

## 2015-01-28 MED ORDER — GADOBENATE DIMEGLUMINE 529 MG/ML IV SOLN
25.0000 mL | Freq: Once | INTRAVENOUS | Status: AC | PRN
  Administered 2015-01-28: 25 mL via INTRAVENOUS

## 2015-01-28 MED ORDER — POTASSIUM CHLORIDE 10 MEQ/100ML IV SOLN
10.0000 meq | Freq: Once | INTRAVENOUS | Status: AC
  Administered 2015-01-28: 10 meq via INTRAVENOUS
  Filled 2015-01-28: qty 100

## 2015-01-28 MED ORDER — POTASSIUM CHLORIDE CRYS ER 20 MEQ PO TBCR
40.0000 meq | EXTENDED_RELEASE_TABLET | Freq: Once | ORAL | Status: AC
  Administered 2015-01-28: 40 meq via ORAL
  Filled 2015-01-28: qty 2

## 2015-01-28 MED ORDER — EPINEPHRINE HCL 1 MG/ML IJ SOLN
INTRAMUSCULAR | Status: AC
  Filled 2015-01-28: qty 2

## 2015-01-28 MED ORDER — SODIUM CHLORIDE 0.9 % IV SOLN: INTRAVENOUS | Status: DC

## 2015-01-28 MED ORDER — CHLORHEXIDINE GLUCONATE 4 % EX LIQD
60.0000 mL | Freq: Once | CUTANEOUS | Status: AC
  Administered 2015-01-28: 4 via TOPICAL
  Filled 2015-01-28: qty 60

## 2015-01-28 MED ORDER — CHLORHEXIDINE GLUCONATE 4 % EX LIQD
60.0000 mL | Freq: Once | CUTANEOUS | Status: DC
  Filled 2015-01-28: qty 60

## 2015-01-28 MED ORDER — POTASSIUM CHLORIDE CRYS ER 20 MEQ PO TBCR: 40.0000 meq | EXTENDED_RELEASE_TABLET | ORAL | Status: DC

## 2015-01-28 MED ORDER — GENTAMICIN SULFATE 40 MG/ML IJ SOLN: 80.0000 mg | INTRAMUSCULAR | Status: DC

## 2015-01-28 MED ORDER — CEFAZOLIN SODIUM-DEXTROSE 2-3 GM-% IV SOLR
2.0000 g | INTRAVENOUS | Status: AC
  Administered 2015-01-29: 2 g via INTRAVENOUS
  Filled 2015-01-28: qty 50

## 2015-01-28 NOTE — Progress Notes (Signed)
Subjective:  Pt seen and examined in AM. No acute events overnight. He has mild chest pain that is worse with coughing due to recent chest compressions and defibrillation. He denies dyspnea, palpitations, lightheadedness, or syncope.    Objective: Vital signs in last 24 hours: Filed Vitals:   01/27/15 0630 01/27/15 1327 01/27/15 2032 01/28/15 0512  BP: 144/80 146/78 151/84 142/98  Pulse: 74 74 69 70  Temp: 98.7 F (37.1 C) 98 F (36.7 C) 98 F (36.7 C) 98.1 F (36.7 C)  TempSrc: Oral Oral Oral Oral  Resp: 18 18 18 18   Height:      Weight:      SpO2: 97% 98% 98% 97%   Weight change:   Intake/Output Summary (Last 24 hours) at 01/28/15 1133 Last data filed at 01/28/15 0830  Gross per 24 hour  Intake 1411.33 ml  Output   1300 ml  Net 111.33 ml   PHYSICAL EXAMINATION:  General: NAD  Heart: Regular rate and rhythm with no murmurs   Lungs: Clear to auscultation with no wheezing, ronchi, or rales.  Abdomen: Soft, non-tender, non-distended, normal BS Extremities: No edema  Neuro: A & O x 3  Lab Results: Basic Metabolic Panel:  Recent Labs Lab 01/22/15 0820  01/27/15 0825 01/28/15 0437  NA 137  < > 138 138  K 3.4*  < > 3.4* 3.3*  CL 112*  < > 106 105  CO2 15*  < > 22 24  GLUCOSE 124*  < > 141* 92  BUN 12  < > 9 7  CREATININE 0.59*  < > 0.72 0.72  CALCIUM 8.1*  < > 8.4* 8.4*  MG 1.8  --   --  1.9  < > = values in this interval not displayed. Liver Function Tests:  Recent Labs Lab 01/25/15 1600 01/27/15 0825  AST 396* 147*  ALT 136* 105*  ALKPHOS 64 53  BILITOT 0.8 0.5  PROT 6.0* 5.5*  ALBUMIN 3.0* 2.9*    CBC:  Recent Labs Lab 01/24/15 0400  01/25/15 1600  01/27/15 0248 01/27/15 0825  WBC 18.8*  < > 13.2*  < > 11.9* 11.2*  NEUTROABS 15.5*  --  9.5*  --   --   --   HGB 11.1*  < > 11.6*  < > 12.1* 11.7*  HCT 31.7*  < > 34.0*  < > 34.9* 34.4*  MCV 80.7  < > 81.0  < > 79.9 80.2  PLT 95*  < > 134*  < > 173 171  < > = values in this interval not  displayed. Cardiac Enzymes:  Recent Labs Lab 01/27/15 0248 01/27/15 0825 01/27/15 1226  TROPONINI 0.10* 0.07* 0.06*   CBG:  Recent Labs Lab 01/25/15 1338 01/25/15 1708 01/25/15 2227 01/26/15 0833 01/26/15 1214 01/27/15 0627  GLUCAP 119* 98 143* 105* 186* 86    Coagulation:  Recent Labs Lab 01/21/15 1642  LABPROT 16.4*  INR 1.31   Urine Drug Screen: Drugs of Abuse     Component Value Date/Time   LABOPIA NONE DETECTED 01/21/2015 0915   LABOPIA NEG 02/11/2010 2105   COCAINSCRNUR NONE DETECTED 01/21/2015 0915   COCAINSCRNUR NEG 02/11/2010 2105   LABBENZ POSITIVE* 01/21/2015 0915   LABBENZ NEG 02/11/2010 2105   AMPHETMU NONE DETECTED 01/21/2015 0915   AMPHETMU NEG 02/11/2010 2105   THCU POSITIVE* 01/21/2015 0915   LABBARB NONE DETECTED 01/21/2015 0915     Micro Results: Recent Results (from the past 240 hour(s))  MRSA PCR Screening  Status: None   Collection Time: 01/21/15 11:17 AM  Result Value Ref Range Status   MRSA by PCR NEGATIVE NEGATIVE Final    Comment:        The GeneXpert MRSA Assay (FDA approved for NASAL specimens only), is one component of a comprehensive MRSA colonization surveillance program. It is not intended to diagnose MRSA infection nor to guide or monitor treatment for MRSA infections.    Studies/Results: Dg Chest 2 View  01/26/2015   CLINICAL DATA:  Chest pain for 5 days, heart catheterization yesterday  EXAM: CHEST  2 VIEW  COMPARISON:  04/01/2013  FINDINGS: Cardiac shadow is within normal limits. Minimal left basilar atelectasis is seen. No sizable effusion or focal infiltrate is noted. No acute bony abnormality is seen.  IMPRESSION: Mild left basilar atelectasis.   Electronically Signed   By: Inez Catalina M.D.   On: 01/26/2015 21:54   Medications: I have reviewed the patient's current medications. Scheduled Meds: . aspirin  81 mg Oral Daily  . enoxaparin (LOVENOX) injection  40 mg Subcutaneous Q24H  . folic acid  1 mg  Oral Daily  . multivitamin with minerals  1 tablet Oral Daily  . sodium chloride  3 mL Intravenous Q12H  . thiamine  100 mg Oral Daily   Continuous Infusions: . sodium chloride 50 mL/hr at 01/27/15 0139   PRN Meds:.acetaminophen **OR** acetaminophen, morphine injection, nitroGLYCERIN, ondansetron **OR** ondansetron (ZOFRAN) IV, polyethylene glycol Assessment/Plan:  Sudden Vfib Arrest - Currently with mild CP in setting of extended CPR of 45 minutes. Left heart catherization with clean coronary arteries and normal LV EF. Etiology unclear, possibly due to channelopathy per cardiology. Pt received flecainide to assess for Brugada syndrome. -Appreciate cardiology recommendations   -IV morphine 1 mg Q 4 hr PRN pain -Continue aspirin 81 mg daily for secondary CVD prevention -Follow-up HgA1c -To have subcutaneous ICD placed tomm -Epinephrine infusion today  to assess for long QT syndrome  -Cardiac MRI today to assess for evidence of intrinsic myocardial pathology  Transaminitis in setting of treated HCV infection and ethanol use - AST/ALT 147/105 on 01/27/15. Pt has been treated for HCV infection with interferon and ribavirin at [facility]. Pt reports socially drinking alcohol (6 cans per week).  -Follow-up HCV viral load  -Administer Hepatitis A & B vaccination if not immune -Multivitamin, folic acid, and thiamine daily   Marijuana Use - Pt with UDS positive for THC. -Counsel on cessation   Tobacco Abuse - Pt reports smoking 1.5 ppd since age 76. Pt reports he is going to quit smoking. -Counsel on cessation   HIV Screening - Pt tested negative for annual screening.  Dispo: Disposition is deferred at this time, awaiting improvement of current medical problems.  Anticipated discharge in approximately 2 day(s).   The patient does have a current PCP Otho Bellows, MD) and does need an [facility] hospital follow-up appointment after discharge.  The patient does not have transportation limitations that  hinder transportation to clinic appointments.  .Services Needed at time of discharge: Y = Yes, Blank = No PT:   OT:   RN:   Equipment:   Other:     LOS: 1 day   Juluis Mire, MD 01/28/2015, 11:33 AM

## 2015-01-28 NOTE — Progress Notes (Signed)
Subjective:    Patient is doing well, no complaints. He underwent flecainide test for Brugada syndrome last night (6/1). Cardiac MRI and epinephrine infusion today, as recommended by Electrophysiology. No light-headedness, palpitations, or chest pain.    Objective:    Vital Signs:   Temp:  [98 F (36.7 C)-98.1 F (36.7 C)] 98.1 F (36.7 C) (06/02 0512) Pulse Rate:  [69-74] 70 (06/02 0512) Resp:  [18] 18 (06/02 0512) BP: (142-151)/(78-98) 142/98 mmHg (06/02 0512) SpO2:  [97 %-98 %] 97 % (06/02 0512) Last BM Date: 01/27/15  24-hour weight change: Weight change:   Intake/Output:   Intake/Output Summary (Last 24 hours) at 01/28/15 1137 Last data filed at 01/28/15 0830  Gross per 24 hour  Intake 1411.33 ml  Output   1300 ml  Net 111.33 ml      Physical Exam: General: Vital signs reviewed and noted. NAD, lying in bed, alert and interactive.   Lungs:  Normal respiratory effort. Clear to auscultation BL without crackles or wheezes.  Heart: RRR. S1 and S2 normal without gallop, murmur, or rubs.  Abdomen:  BS normoactive. Soft, nondistended, non-tender.  No masses or organomegaly.  Extremities: No lower extremity edema, warm and well perfused.      Labs:  Basic Metabolic Panel:  Recent Labs Lab 01/22/15 0820  01/25/15 1600 01/26/15 0501 01/26/15 2130 01/27/15 0248 01/27/15 0825 01/28/15 0437  NA 137  < > 134* 135 137  --  138 138  K 3.4*  < > 3.3* 3.7 3.4*  --  3.4* 3.3*  CL 112*  < > 102 104 105  --  106 105  CO2 15*  < > 28 25 23   --  22 24  GLUCOSE 124*  < > 121* 102* 116*  --  141* 92  BUN 12  < > 8 10 9   --  9 7  CREATININE 0.59*  < > 0.76 0.72 0.73 0.70 0.72 0.72  CALCIUM 8.1*  < > 8.1* 8.1* 8.7*  --  8.4* 8.4*  MG 1.8  --   --   --   --   --   --  1.9  < > = values in this interval not displayed.  Liver Function Tests:  Recent Labs Lab 01/25/15 1600 01/27/15 0825  AST 396* 147*  ALT 136* 105*  ALKPHOS 64 53  BILITOT 0.8 0.5  PROT 6.0* 5.5*    ALBUMIN 3.0* 2.9*   No results for input(s): LIPASE, AMYLASE in the last 168 hours. No results for input(s): AMMONIA in the last 168 hours.  CBC:  Recent Labs Lab 01/24/15 0400  01/25/15 1600 01/26/15 0501 01/26/15 2130 01/27/15 0248 01/27/15 0825  WBC 18.8*  < > 13.2* 14.3* 13.2* 11.9* 11.2*  NEUTROABS 15.5*  --  9.5*  --   --   --   --   HGB 11.1*  < > 11.6* 11.9* 13.1 12.1* 11.7*  HCT 31.7*  < > 34.0* 34.8* 37.3* 34.9* 34.4*  MCV 80.7  < > 81.0 80.7 80.4 79.9 80.2  PLT 95*  < > 134* 152 165 173 171  < > = values in this interval not displayed.  Cardiac Enzymes:  Recent Labs Lab 01/21/15 1709 01/22/15 0233 01/27/15 0248 01/27/15 0825 01/27/15 1226  TROPONINI 6.24* 3.44* 0.10* 0.07* 0.06*    BNP: Invalid input(s): POCBNP  CBG:  Recent Labs Lab 01/25/15 1708 01/25/15 2227 01/26/15 0833 01/26/15 1214 01/27/15 0627  GLUCAP 98 143* 105* 186* 86  Microbiology: Results for orders placed or performed during the hospital encounter of 01/21/15  MRSA PCR Screening     Status: None   Collection Time: 01/21/15 11:17 AM  Result Value Ref Range Status   MRSA by PCR NEGATIVE NEGATIVE Final    Comment:        The GeneXpert MRSA Assay (FDA approved for NASAL specimens only), is one component of a comprehensive MRSA colonization surveillance program. It is not intended to diagnose MRSA infection nor to guide or monitor treatment for MRSA infections.     Coagulation Studies: No results for input(s): LABPROT, INR in the last 72 hours.   Other results: EKG (6/2): Normal sinus rhythm, no ST segment changes, no T wave inversions, no bundle branch block.    Imaging: Dg Chest 2 View  01/26/2015   CLINICAL DATA:  Chest pain for 5 days, heart catheterization yesterday  EXAM: CHEST  2 VIEW  COMPARISON:  04/01/2013  FINDINGS: Cardiac shadow is within normal limits. Minimal left basilar atelectasis is seen. No sizable effusion or focal infiltrate is noted. No  acute bony abnormality is seen.  IMPRESSION: Mild left basilar atelectasis.   Electronically Signed   By: Inez Catalina M.D.   On: 01/26/2015 21:54       Medications:    Infusions: . sodium chloride 50 mL/hr at 01/27/15 0139    Scheduled Medications: . aspirin  81 mg Oral Daily  . enoxaparin (LOVENOX) injection  40 mg Subcutaneous Q24H  . folic acid  1 mg Oral Daily  . multivitamin with minerals  1 tablet Oral Daily  . sodium chloride  3 mL Intravenous Q12H  . thiamine  100 mg Oral Daily    PRN Medications: acetaminophen **OR** acetaminophen, gadobenate dimeglumine, morphine injection, nitroGLYCERIN, ondansetron **OR** ondansetron (ZOFRAN) IV, polyethylene glycol   Assessment/ Plan:   54 year old man with no significant PMHx who presented with sudden VF cardiac arrest, now undergoing workup for cardiac arrest secondary to nonischemic cardiac etiology.    Principal Problem:   Cardiac arrest Active Problems:   TOBACCO ABUSE   Posttraumatic stress disorder   Hypokalemia    Sudden VFib arrest: Sudden VFib arrest in the setting of normal LV function and clean coronary arteries on left heart catheterization (01/26/15), despite low EF 35% on 2D-echo. Differential includes inherited arrhythmia syndromes, possibly due to a channelopathy such as Brugada syndrome or long QT syndrome. However, QTc not prolonged on 12-lead ECG. Labs significant for mild hypokalemia (K 3.3) and TSH within normal limits. -Appreciate cardiology and electrophysiology recommendations -Flecainide test with serial ECGs  -Cardiac MRI 6/2 to assess for structural myocardial pathology -Epinephrine infusions 6/2 to assess for long QT syndrome -Replete potassium as below -Plan for subcutaneous ICD placement tomorrow (6/3), mapping completed -Repeat 2D-echo in 3 mos to reassess EF -No statin or beta-blocker needed given normal lipid panel except low HDL and normal EF  Elevated liver enzymes in setting of treated  HCV infection - AST/ALT 147/105. Pt has been treated for HCV infection at [facility] with ribavirin and interferon and reports clearance. Pt also reports socially drinking alcohol (6 cans per week). Confirmed vaccination against hepatitis A and B and previous imaging showed mild fibrosis, no cirrhosis.  -Follow up HIV Ab and HCV viral load, pending -Continue multivitamin, folic acid, and thiamine daily   Marijuana Use - Pt with UDS positive for THC on initial presentation -Counsel on cessation   Tobacco Abuse - Pt reports smoking 1.5 ppd since age  21. Pt reports he is going to quit smoking. -Counsel on cessation   Dispo: Disposition is deferred at this time, awaiting improvement of current medical problems. Anticipated discharge in approximately 2-3 day(s).   The patient does have a current PCP Otho Bellows, MD) and does need an [facility] hospital follow-up appointment after discharge.  The patient does not have transportation limitations that hinder transportation to clinic appointments.   SERVICE NEEDED AT [place]         Y = Yes, Blank = No PT:   OT:   RN:   Equipment:   Other:      Length of Stay: 1 day(s)   Signed: Betsey Amen, MS4

## 2015-01-28 NOTE — Progress Notes (Signed)
Patient Name: [name] [name]      SUBJECTIVE: Without complaints   Understands plan   We also had discussion yesterday regarding his recurring exits from the hospital and the importance of being part of his own health care team  Past Medical History  Diagnosis Date  . Cardiac arrest 12/2014  . PULMONARY NODULE 12/21/2009    Stable 61mm per repeat CXR 4/11  . PTSD (post-traumatic stress disorder)     Found deceased brother- resolved per pt  . Tobacco abuse     1 ppd x 81 ys. Quit 04/22/12  . IV drug abuse     Quit. Used in the 72s  . Depression     Resolved- pt contributes to previous drug use  . Anemia   . Substance abuse   . Hemorrhoids   . Hepatitis C     S/p completing treatment at [facility]    Scheduled Meds:  Scheduled Meds: . aspirin  81 mg Oral Daily  . enoxaparin (LOVENOX) injection  40 mg Subcutaneous Q24H  . folic acid  1 mg Oral Daily  . multivitamin with minerals  1 tablet Oral Daily  . potassium chloride  10 mEq Intravenous Once  . potassium chloride SA  40 mEq Oral Once  . potassium chloride  40 mEq Oral Q3H  . sodium chloride  3 mL Intravenous Q12H  . thiamine  100 mg Oral Daily   Continuous Infusions: . sodium chloride 50 mL/hr at 01/27/15 0139   acetaminophen **OR** acetaminophen, morphine injection, nitroGLYCERIN, ondansetron **OR** ondansetron (ZOFRAN) IV, polyethylene glycol    PHYSICAL EXAM Filed Vitals:   01/27/15 0630 01/27/15 1327 01/27/15 2032 01/28/15 0512  BP: 144/80 146/78 151/84 142/98  Pulse: 74 74 69 70  Temp: 98.7 F (37.1 C) 98 F (36.7 C) 98 F (36.7 C) 98.1 F (36.7 C)  TempSrc: Oral Oral Oral Oral  Resp: 18 18 18 18   Height:      Weight:      SpO2: 97% 98% 98% 97%   Well developed and nourished in no acute distress HENT normal Neck supple with JVP-flat Clear Regular rate and rhythm, no murmurs or gallops Abd-soft with active BS No Clubbing cyanosis edema Skin-warm and dry A & Oriented  Grossly normal  sensory and motor function   TELEMETRY: Reviewed telemetry pt in  nsr:    Intake/Output Summary (Last 24 hours) at 01/28/15 0734 Last data filed at 01/27/15 2239  Gross per 24 hour  Intake 1291.33 ml  Output   1300 ml  Net  -8.67 ml    LABS: Basic Metabolic Panel:  Recent Labs Lab 01/22/15 0820  01/23/15 0335 01/24/15 0400 01/25/15 1600 01/26/15 0501 01/26/15 2130 01/27/15 0248 01/27/15 0825 01/28/15 0437  NA 137  < > 137 131* 134* 135 137  --  138 138  K 3.4*  < > 4.3 3.6 3.3* 3.7 3.4*  --  3.4* 3.3*  CL 112*  < > 109 101 102 104 105  --  106 105  CO2 15*  < > 17* 24 28 25 23   --  22 24  GLUCOSE 124*  < > 135* 95 121* 102* 116*  --  141* 92  BUN 12  < > 15 8 8 10 9   --  9 7  CREATININE 0.59*  < > 0.97 0.65 0.76 0.72 0.73 0.70 0.72 0.72  CALCIUM 8.1*  < > 8.0* 8.1* 8.1* 8.1* 8.7*  --  8.4*  8.4*  MG 1.8  --   --   --   --   --   --   --   --  1.9  < > = values in this interval not displayed. Cardiac Enzymes:  Recent Labs  01/27/15 0248 01/27/15 0825 01/27/15 1226  TROPONINI 0.10* 0.07* 0.06*   CBC:  Recent Labs Lab 01/24/15 0400 01/25/15 0430 01/25/15 1600 01/26/15 0501 01/26/15 2130 01/27/15 0248 01/27/15 0825  WBC 18.8* 16.1* 13.2* 14.3* 13.2* 11.9* 11.2*  NEUTROABS 15.5*  --  9.5*  --   --   --   --   HGB 11.1* 11.3* 11.6* 11.9* 13.1 12.1* 11.7*  HCT 31.7* 33.0* 34.0* 34.8* 37.3* 34.9* 34.4*  MCV 80.7 80.3 81.0 80.7 80.4 79.9 80.2  PLT 95* 125* 134* 152 165 173 171   PROTIME: No results for input(s): LABPROT, INR in the last 72 hours. Liver Function Tests:  Recent Labs  01/25/15 1600 01/27/15 0825  AST 396* 147*  ALT 136* 105*  ALKPHOS 64 53  BILITOT 0.8 0.5  PROT 6.0* 5.5*  ALBUMIN 3.0* 2.9*   No results for input(s): LIPASE, AMYLASE in the last 72 hours. BNP: BNP (last 3 results)  Recent Labs  01/26/15 2130  BNP 140.1*    ProBNP (last 3 results) No results for input(s): PROBNP in the last 8760 hours.  D-Dimer: No  results for input(s): DDIMER in the last 72 hours. Hemoglobin A1C: No results for input(s): HGBA1C in the last 72 hours. Fasting Lipid Panel:  Recent Labs  01/28/15 0437  CHOL 126  HDL 26*  LDLCALC 82  TRIG 91  CHOLHDL 4.8   Thyroid Function Tests:  Recent Labs  01/28/15 0437  TSH 3.300   Anemia Panel: No results for input(s): VITAMINB12, FOLATE, FERRITIN, TIBC, IRON, RETICCTPCT in the last 72 hours.  Flecainide Challenge >>Neg  SAECG pending   ASSESSMENT AND PLAN:  Principal Problem:   Cardiac arrest Active Problems:   Posttraumatic stress disorder   Hypokalemia  For Cardiac MRI today as well as Epi infusion  MRI should not be bothered by dental posts per DM  S ICD in am  Mapping ok Hypokalemic >>replete generously  Not sure why but seemingly new  Orders written  Signed, Virl Axe MD  01/28/2015

## 2015-01-28 NOTE — Care Management Note (Signed)
Case Management Note  Patient Details  Name: [name] [name] MRN: [id number] Date of Birth: [birth year]  Subjective/Objective:   Pt admitted with cardiac arrest                 Action/Plan:  Per Epic, pt doesn't have insurance nor PCP listed.  CM will assist with PCP and medication assistance.  CM will continue to monitor for disposition needs   Expected Discharge Date:                  Expected Discharge Plan:  Home/Self Care  In-House Referral:     Discharge planning Services  CM Consult  Post Acute Care Choice:    Choice offered to:     DME Arranged:    DME Agency:     HH Arranged:    [place] Agency:     Status of Service:  Completed, signed off  Medicare Important Message Given:  No Date Medicare IM Given:    Medicare IM give by:    Date Additional Medicare IM Given:    Additional Medicare Important Message give by:     If discussed at [place] of Stay Meetings, dates discussed:    Additional Comments: 01/28/15 Elenor Quinones, RN, BSN [phone].  CM spoke with pt and was informed that pt has PCP Dr. Monica Martinez with [facility] Medicine at [facility].  Pt also informed CM that he has orange card for medication assistance.  CM will continue to monitor for disposition needs.    Maryclare Labrador, RN 01/28/2015, 2:41 PM

## 2015-01-29 ENCOUNTER — Inpatient Hospital Stay (HOSPITAL_COMMUNITY): Payer: MEDICAID | Admitting: Anesthesiology

## 2015-01-29 ENCOUNTER — Encounter (HOSPITAL_COMMUNITY)
Admission: EM | Disposition: A | Discharge status: Home / Self Care | Payer: Self-pay | Source: Home / Self Care | Attending: Internal Medicine

## 2015-01-29 ENCOUNTER — Inpatient Hospital Stay (HOSPITAL_COMMUNITY): Payer: Self-pay | Admitting: Anesthesiology

## 2015-01-29 ENCOUNTER — Encounter (HOSPITAL_COMMUNITY): Payer: Self-pay | Admitting: Anesthesiology

## 2015-01-29 DIAGNOSIS — I4901 Ventricular fibrillation: Secondary | ICD-10-CM

## 2015-01-29 HISTORY — PX: EP IMPLANTABLE DEVICE: SHX172B

## 2015-01-29 LAB — COMPREHENSIVE METABOLIC PANEL
ALK PHOS: 56 U/L (ref 38–126)
ALT: 69 U/L — ABNORMAL HIGH (ref 17–63)
AST: 53 U/L — ABNORMAL HIGH (ref 15–41)
Albumin: 2.9 g/dL — ABNORMAL LOW (ref 3.5–5.0)
Anion gap: 8 (ref 5–15)
BUN: 7 mg/dL (ref 6–20)
CALCIUM: 8.6 mg/dL — AB (ref 8.9–10.3)
CO2: 26 mmol/L (ref 22–32)
CREATININE: 0.74 mg/dL (ref 0.61–1.24)
Chloride: 105 mmol/L (ref 101–111)
GFR calc Af Amer: >60 mL/min (ref >60)
GFR calc non Af Amer: >60 mL/min (ref >60)
GLUCOSE: 98 mg/dL (ref 65–99)
Potassium: 3.8 mmol/L (ref 3.5–5.1)
Sodium: 139 mmol/L (ref 135–145)
Total Bilirubin: 0.5 mg/dL (ref 0.3–1.2)
Total Protein: 5.7 g/dL — ABNORMAL LOW (ref 6.5–8.1)

## 2015-01-29 LAB — HEPATITIS A ANTIBODY, TOTAL: Hep A Total Ab: POSITIVE — AB

## 2015-01-29 LAB — CBC
HCT: 34.8 % — ABNORMAL LOW (ref 39.0–52.0)
Hemoglobin: 11.8 g/dL — ABNORMAL LOW (ref 13.0–17.0)
MCH: 27.6 pg (ref 26.0–34.0)
MCHC: 33.9 g/dL (ref 30.0–36.0)
MCV: 81.3 fL (ref 78.0–100.0)
Platelets: 242 10*3/uL (ref 150–400)
RBC: 4.28 MIL/uL (ref 4.22–5.81)
RDW: 14.1 % (ref 11.5–15.5)
WBC: 13.9 10*3/uL — ABNORMAL HIGH (ref 4.0–10.5)

## 2015-01-29 LAB — HEPATITIS B SURFACE ANTIBODY,QUALITATIVE: Hep B S Ab: NONREACTIVE

## 2015-01-29 LAB — MRSA PCR SCREENING: MRSA BY PCR: NEGATIVE

## 2015-01-29 LAB — HEPATITIS A ANTIBODY, IGM: Hep A IgM: NEGATIVE — AB

## 2015-01-29 LAB — HEMOGLOBIN A1C
HEMOGLOBIN A1C: 5.6 % (ref 4.8–5.6)
Mean Plasma Glucose: 114 mg/dL

## 2015-01-29 LAB — GLUCOSE, CAPILLARY: Glucose-Capillary: 94 mg/dL (ref 65–99)

## 2015-01-29 SURGERY — SUBQ ICD IMPLANT
Anesthesia: Monitor Anesthesia Care

## 2015-01-29 MED ORDER — PROPOFOL 10 MG/ML IV BOLUS
INTRAVENOUS | Status: DC | PRN
  Administered 2015-01-29: 200 mg via INTRAVENOUS

## 2015-01-29 MED ORDER — LISINOPRIL 5 MG PO TABS
5.0000 mg | ORAL_TABLET | Freq: Every day | ORAL | Status: DC
  Administered 2015-01-29 – 2015-01-30 (×2): 5 mg via ORAL
  Filled 2015-01-29 (×2): qty 1

## 2015-01-29 MED ORDER — CEFAZOLIN SODIUM 1-5 GM-% IV SOLN
1.0000 g | Freq: Four times a day (QID) | INTRAVENOUS | Status: AC
  Administered 2015-01-29 – 2015-01-30 (×3): 1 g via INTRAVENOUS
  Filled 2015-01-29 (×3): qty 50

## 2015-01-29 MED ORDER — BUPIVACAINE HCL (PF) 0.25 % IJ SOLN
INTRAMUSCULAR | Status: AC
  Filled 2015-01-29: qty 30

## 2015-01-29 MED ORDER — ONDANSETRON HCL 4 MG/2ML IJ SOLN: 4.0000 mg | Freq: Once | INTRAMUSCULAR | Status: DC | PRN

## 2015-01-29 MED ORDER — ONDANSETRON HCL 4 MG/2ML IJ SOLN
INTRAMUSCULAR | Status: DC | PRN
  Administered 2015-01-29: 4 mg via INTRAVENOUS

## 2015-01-29 MED ORDER — ACETAMINOPHEN 325 MG PO TABS: 325.0000 mg | ORAL_TABLET | ORAL | Status: DC | PRN

## 2015-01-29 MED ORDER — CEFAZOLIN SODIUM-DEXTROSE 2-3 GM-% IV SOLR
INTRAVENOUS | Status: AC
  Filled 2015-01-29: qty 50

## 2015-01-29 MED ORDER — DEXTROSE 5 % IV SOLN
4000.0000 ug | INTRAVENOUS | Status: DC | PRN
  Administered 2015-01-29: .05 ug/kg/min via INTRAVENOUS

## 2015-01-29 MED ORDER — BUPIVACAINE HCL (PF) 0.25 % IJ SOLN
INTRAMUSCULAR | Status: DC | PRN
  Administered 2015-01-29: 35 mg

## 2015-01-29 MED ORDER — GLYCOPYRROLATE 0.2 MG/ML IJ SOLN
INTRAMUSCULAR | Status: DC | PRN
  Administered 2015-01-29: .6 mg via INTRAVENOUS

## 2015-01-29 MED ORDER — EPINEPHRINE HCL 0.1 MG/ML IJ SOSY
PREFILLED_SYRINGE | INTRAMUSCULAR | Status: DC | PRN
Start: 2015-01-29 — End: 2015-01-29
  Administered 2015-01-29: 3.25 mL via INTRAVENOUS
  Administered 2015-01-29: 19.5 mL via INTRAVENOUS
  Administered 2015-01-29: 9.8 mL via INTRAVENOUS

## 2015-01-29 MED ORDER — NEOSTIGMINE METHYLSULFATE 10 MG/10ML IV SOLN
INTRAVENOUS | Status: DC | PRN
  Administered 2015-01-29: 5 mg via INTRAVENOUS

## 2015-01-29 MED ORDER — MORPHINE SULFATE 4 MG/ML IJ SOLN
4.0000 mg | INTRAMUSCULAR | Status: DC | PRN
Start: 2015-01-29 — End: 2015-01-30
  Administered 2015-01-29 – 2015-01-30 (×5): 4 mg via INTRAVENOUS
  Filled 2015-01-29 (×4): qty 1

## 2015-01-29 MED ORDER — ONDANSETRON HCL 4 MG/2ML IJ SOLN: 4.0000 mg | Freq: Four times a day (QID) | INTRAMUSCULAR | Status: DC | PRN

## 2015-01-29 MED ORDER — MORPHINE SULFATE 2 MG/ML IJ SOLN
INTRAMUSCULAR | Status: AC
  Filled 2015-01-29: qty 1

## 2015-01-29 MED ORDER — DEXTROSE 5 % IV SOLN
10.0000 mg | INTRAVENOUS | Status: DC | PRN
  Administered 2015-01-29: 20 ug/min via INTRAVENOUS

## 2015-01-29 MED ORDER — ROCURONIUM BROMIDE 100 MG/10ML IV SOLN
INTRAVENOUS | Status: DC | PRN
  Administered 2015-01-29 (×2): 20 mg via INTRAVENOUS
  Administered 2015-01-29: 10 mg via INTRAVENOUS

## 2015-01-29 MED ORDER — GENTAMICIN SULFATE 40 MG/ML IJ SOLN
INTRAMUSCULAR | Status: AC
  Filled 2015-01-29: qty 2

## 2015-01-29 MED ORDER — LACTATED RINGERS IV SOLN
INTRAVENOUS | Status: DC | PRN
  Administered 2015-01-29 (×2): via INTRAVENOUS

## 2015-01-29 MED ORDER — FENTANYL CITRATE (PF) 100 MCG/2ML IJ SOLN
INTRAMUSCULAR | Status: DC | PRN
  Administered 2015-01-29 (×2): 100 ug via INTRAVENOUS
  Administered 2015-01-29: 50 ug via INTRAVENOUS

## 2015-01-29 MED ORDER — CARVEDILOL 3.125 MG PO TABS
3.1250 mg | ORAL_TABLET | Freq: Two times a day (BID) | ORAL | Status: DC
  Administered 2015-01-29 – 2015-01-30 (×2): 3.125 mg via ORAL
  Filled 2015-01-29 (×4): qty 1

## 2015-01-29 MED ORDER — HEPARIN (PORCINE) IN NACL 2-0.9 UNIT/ML-% IJ SOLN
INTRAMUSCULAR | Status: AC
  Filled 2015-01-29: qty 500

## 2015-01-29 MED ORDER — MORPHINE SULFATE 4 MG/ML IJ SOLN
INTRAMUSCULAR | Status: AC
Start: 2015-01-29 — End: 2015-01-29
  Administered 2015-01-29: 4 mg via INTRAVENOUS
  Filled 2015-01-29: qty 1

## 2015-01-29 SURGICAL SUPPLY — 6 items
BLANKET WARM UNDERBOD FULL ACC (MISCELLANEOUS) ×3 IMPLANT
HEMOSTAT SURGICEL 2X4 FIBR (HEMOSTASIS) ×3 IMPLANT
ICD SUBQ EMBLEM S-ICD 3401 (Lead) ×3 IMPLANT
ICD SUBQ EMBLEM S-PULSE A209 (ICD Generator) ×3 IMPLANT
PAD DEFIB LIFELINK (PAD) ×6 IMPLANT
TRAY PACEMAKER INSERTION (CUSTOM PROCEDURE TRAY) ×3 IMPLANT

## 2015-01-29 NOTE — Interval H&P Note (Signed)
ICD Criteria  Current LVEF:55% ;Obtained < 1 month ago.  NYHA Functional Classification: Class I  Heart Failure History:  No.  Non-Ischemic Dilated Cardiomyopathy History:  No.  Atrial Fibrillation/Atrial Flutter:  No.  Ventricular Tachycardia History:  Yes, Hemodynamic instability present, VT Type:  SVT - Polymorphic.  Cardiac Arrest History:  Yes, This was a Ventricular Tachycardia/Ventricular Fibrillation Arrest. This was NOT a bradycardia arrest.  History of Syndromes with Risk of Sudden Death:  No.  Previous ICD:  No.  Electrophysiology Study: No.  Prior MI: No.  PPM: No.  OSA:  No  Patient Life Expectancy of >=1 year: Yes.  Anticoagulation Therapy:  Patient is NOT on anticoagulation therapy.   Beta Blocker Therapy:  Yes.   Ace Inhibitor/ARB Therapy:  Yes.History and Physical Interval Note:  01/29/2015 11:32 AM  [name] [name]  has presented today for surgery, with the diagnosis of cardiac arrest  The various methods of treatment have been discussed with the patient and family. After consideration of risks, benefits and other options for treatment, the patient has consented to  Procedure(s): SubQ ICD Implant (N/A) as a surgical intervention .  The patient's history has been reviewed, patient examined, no change in status, stable for surgery.  I have reviewed the patient's chart and labs.  Questions were answered to the patient's satisfaction.     Virl Axe

## 2015-01-29 NOTE — Progress Notes (Signed)
Pt remains stable in room sleeping comfortably in bed with call light within reach and spouse at bedside. Reported off to incoming RN. Francis Gaines Myisha Pickerel RN.

## 2015-01-29 NOTE — Progress Notes (Signed)
Subjective:  Pt seen and examined in AM. No acute events overnight. He continues to have mild chest pain but denies dyspnea, palpitations, lightheadedness, or syncope. He is to have ICD placement today.   Objective: Vital signs in last 24 hours: Filed Vitals:   01/28/15 0512 01/28/15 1436 01/28/15 1952 01/29/15 0528  BP: 142/98 178/84 150/85 146/83  Pulse: 70 69 64 71  Temp: 98.1 F (36.7 C) 98.2 F (36.8 C) 98.8 F (37.1 C) 98.6 F (37 C)  TempSrc: Oral Oral Oral Oral  Resp: 18 18 19 20   Height:      Weight:      SpO2: 97% 99% 99% 97%   Weight change:   Intake/Output Summary (Last 24 hours) at 01/29/15 0816 Last data filed at 01/28/15 1645  Gross per 24 hour  Intake    360 ml  Output      0 ml  Net    360 ml   PHYSICAL EXAMINATION:  General: NAD  Heart: Regular rate and rhythm with no murmurs   Lungs: Clear to auscultation with no wheezing, ronchi, or rales.  Abdomen: Soft, non-tender, non-distended, normal BS Extremities: No edema  Neuro: A & O x 3  Lab Results: Basic Metabolic Panel:  Recent Labs Lab 01/22/15 0820  01/28/15 0437 01/29/15 0630  NA 137  < > 138 139  K 3.4*  < > 3.3* 3.8  CL 112*  < > 105 105  CO2 15*  < > 24 26  GLUCOSE 124*  < > 92 98  BUN 12  < > 7 7  CREATININE 0.59*  < > 0.72 0.74  CALCIUM 8.1*  < > 8.4* 8.6*  MG 1.8  --  1.9  --   < > = values in this interval not displayed. Liver Function Tests:  Recent Labs Lab 01/27/15 0825 01/29/15 0630  AST 147* 53*  ALT 105* 69*  ALKPHOS 53 56  BILITOT 0.5 0.5  PROT 5.5* 5.7*  ALBUMIN 2.9* 2.9*    CBC:  Recent Labs Lab 01/24/15 0400  01/25/15 1600  01/27/15 0825 01/29/15 0630  WBC 18.8*  < > 13.2*  < > 11.2* 13.9*  NEUTROABS 15.5*  --  9.5*  --   --   --   HGB 11.1*  < > 11.6*  < > 11.7* 11.8*  HCT 31.7*  < > 34.0*  < > 34.4* 34.8*  MCV 80.7  < > 81.0  < > 80.2 81.3  PLT 95*  < > 134*  < > 171 242  < > = values in this interval not displayed. Cardiac  Enzymes:  Recent Labs Lab 01/27/15 0248 01/27/15 0825 01/27/15 1226  TROPONINI 0.10* 0.07* 0.06*   CBG:  Recent Labs Lab 01/25/15 1708 01/25/15 2227 01/26/15 0833 01/26/15 1214 01/27/15 0627 01/29/15 0547  GLUCAP 98 143* 105* 186* 86 94    Urine Drug Screen: Drugs of Abuse     Component Value Date/Time   LABOPIA NONE DETECTED 01/21/2015 0915   LABOPIA NEG 02/11/2010 2105   COCAINSCRNUR NONE DETECTED 01/21/2015 0915   COCAINSCRNUR NEG 02/11/2010 2105   LABBENZ POSITIVE* 01/21/2015 0915   LABBENZ NEG 02/11/2010 2105   AMPHETMU NONE DETECTED 01/21/2015 0915   AMPHETMU NEG 02/11/2010 2105   THCU POSITIVE* 01/21/2015 0915   LABBARB NONE DETECTED 01/21/2015 0915     Micro Results: Recent Results (from the past 240 hour(s))  MRSA PCR Screening     Status: None   Collection Time:  01/21/15 11:17 AM  Result Value Ref Range Status   MRSA by PCR NEGATIVE NEGATIVE Final    Comment:        The GeneXpert MRSA Assay (FDA approved for NASAL specimens only), is one component of a comprehensive MRSA colonization surveillance program. It is not intended to diagnose MRSA infection nor to guide or monitor treatment for MRSA infections.    Studies/Results: Mr Card Morphology Wo/w Cm  01/28/2015   CLINICAL DATA:  Cardiac arrest  EXAM: CARDIAC MRI  TECHNIQUE: The patient was scanned on a 1.5 Tesla GE magnet. A dedicated cardiac coil was used. Functional imaging was done using Fiesta sequences. 2,3, and 4 chamber views were done to assess for RWMA's. Modified Simpson's rule using a short axis stack was used to calculate an ejection fraction on a dedicated work Conservation officer, nature. The patient received 23 cc of Multihance. After 10 minutes inversion recovery sequences were used to assess for infiltration and scar tissue.  CONTRAST:  23 cc  FINDINGS: Limited images of the lung fields showed a trivial left pleural effusion.  There was a prominent epicardial fat pad. The left  ventricle was normal in size and systolic function, EF 84%. There was mild concentric LV hypertrophy. Normal wall motion. Normal right ventricular size and systolic function. No regional wall motion abnormalities or localized aneurysms. T1 imaging showed no evidence for fibrofatty infiltration of the RV myocardium. The atria were normal in size. Trileaflet aortic valve with no stenosis or regurgitation. No significant mitral regurgitation was noted.  On delayed enhancement imaging, there was a small area of possible mid-wall late gadolinium enhancement (LGE) in the mid inferior wall. This was seen only on the short axis images and was not noted on the axials.  MEASUREMENTS: MEASUREMENTS LV EDV 112 mL  LV SV 65 mL  LV EF 58%  IMPRESSION: 1. Normal LV size and systolic function, EF 13%. Mild concentric LV hypertrophy. No evidence for hypertrophic cardiomyopathy.  2.  Normal RV size and systolic function, no evidence for ARVC.  3. On delayed enhancement imaging, there was a possible very small area of mid-wall LGE in the mid inferior wall. This was seen only on the short axis images and not reproduced on the axial images. This did not appear to be a coronary disease pattern. This could indicate a small area of scar or inflammation but is not specific for any particular process.  Dalton Mclean   Electronically Signed   By: Loralie Champagne M.D.   On: 01/28/2015 17:26   Medications: I have reviewed the patient's current medications. Scheduled Meds: . aspirin  81 mg Oral Daily  .  ceFAZolin (ANCEF) IV  2 g Intravenous To Cath  . chlorhexidine  60 mL Topical Once  . enoxaparin (LOVENOX) injection  40 mg Subcutaneous Q24H  . folic acid  1 mg Oral Daily  . gentamicin irrigation  80 mg Irrigation On Call  . multivitamin with minerals  1 tablet Oral Daily  . sodium chloride  3 mL Intravenous Q12H  . thiamine  100 mg Oral Daily   Continuous Infusions: . sodium chloride 50 mL/hr at 01/28/15 1930   PRN  Meds:.acetaminophen **OR** acetaminophen, morphine injection, nitroGLYCERIN, ondansetron **OR** ondansetron (ZOFRAN) IV, polyethylene glycol Assessment/Plan:  Sudden Vfib Arrest - Currently with mild CP in setting of extended CPR of 45 minutes. Left heart catherization with clean coronary arteries and normal LV EF. Etiology unclear, possibly due to channelopathy per cardiology. Pt received flecainide to  assess for Brugada syndrome. Cardiac MRI was normal with normal EF 58%.  -Appreciate cardiology recommendations   -IV morphine 1 mg Q 4 hr PRN pain -Continue aspirin 81 mg daily for secondary CVD prevention -To have subcutaneous ICD placed today  Transaminitis in setting of ischemia and treated HCV infection - AST/ALT improved to 53/69 from 147/105 most likely in setting of cardiac ischemia. Pt has been treated for HCV infection with interferon and ribavirin at [facility]. Pt reports socially drinking alcohol (6 cans per week).  -Follow-up HCV viral load  -Administer Hepatitis B vaccination if not immune (pt is immune to Hep A)  -Multivitamin, folic acid, and thiamine daily   Marijuana Use - Pt with UDS positive for THC. -Counsel on cessation   Tobacco Abuse - Pt reports smoking 1.5 ppd since age 62. Pt reports he is going to quit smoking. -Counsel on cessation   HIV Screening - Pt tested negative for annual screening.  Dispo: Disposition is deferred at this time, awaiting improvement of current medical problems.  Anticipated discharge in approximately 1 day(s).   The patient does have a current PCP Otho Bellows, MD) and does need an [facility] hospital follow-up appointment after discharge.  The patient does not have transportation limitations that hinder transportation to clinic appointments.  .Services Needed at time of discharge: Y = Yes, Blank = No PT:   OT:   RN:   Equipment:   Other:     LOS: 2 days   Juluis Mire, MD 01/29/2015, 8:16 AM

## 2015-01-29 NOTE — Anesthesia Postprocedure Evaluation (Signed)
  Anesthesia Post-op Note  Patient: [name] [name]  Procedure(s) Performed: Procedure(s): SubQ ICD Implant (N/A)  Patient Location: Cath Lab  Anesthesia Type:General  Level of Consciousness: awake, alert , oriented and patient cooperative  Airway and Oxygen Therapy: Patient Spontanous Breathing and Patient connected to nasal cannula oxygen  Post-op Pain: none  Post-op Assessment: Post-op Vital signs reviewed, Patient's Cardiovascular Status Stable, Respiratory Function Stable, Patent Airway, No signs of Nausea or vomiting and Pain level controlled  Post-op Vital Signs: Reviewed and stable  Last Vitals:  Filed Vitals:   01/29/15 1625  BP: 172/93  Pulse: 86  Temp:   Resp: 20    Complications: No apparent anesthesia complications

## 2015-01-29 NOTE — Progress Notes (Signed)
Pt off unit to cath lab for procedure. Francis Gaines Zalmen Wrightsman RN.

## 2015-01-29 NOTE — Transfer of Care (Signed)
Immediate Anesthesia Transfer of Care Note  Patient: [name] [name]  Procedure(s) Performed: Procedure(s): SubQ ICD Implant (N/A)  Patient Location: PACU and Cath Lab  Anesthesia Type:General  Level of Consciousness: awake, alert , oriented and patient cooperative  Airway & Oxygen Therapy: Patient Spontanous Breathing and Patient connected to nasal cannula oxygen  Post-op Assessment: Report given to RN, Post -op Vital signs reviewed and stable, Patient moving all extremities and Patient moving all extremities X 4  Post vital signs: Reviewed and stable  Last Vitals:  Filed Vitals:   01/29/15 0528  BP: 146/83  Pulse: 71  Temp: 37 C  Resp: 20    Complications: No apparent anesthesia complications

## 2015-01-29 NOTE — H&P (View-Only) (Signed)
Patient Name: [name] [name]      SUBJECTIVE: Without complaints   Understands plan   We also had discussion yesterday regarding his recurring exits from the hospital and the importance of being part of his own health care team  Past Medical History  Diagnosis Date  . Cardiac arrest 12/2014  . PULMONARY NODULE 12/21/2009    Stable 99mm per repeat CXR 4/11  . PTSD (post-traumatic stress disorder)     Found deceased brother- resolved per pt  . Tobacco abuse     1 ppd x 38 ys. Quit 04/22/12  . IV drug abuse     Quit. Used in the 52s  . Depression     Resolved- pt contributes to previous drug use  . Anemia   . Substance abuse   . Hemorrhoids   . Hepatitis C     S/p completing treatment at [facility]    Scheduled Meds:  Scheduled Meds: . aspirin  81 mg Oral Daily  . enoxaparin (LOVENOX) injection  40 mg Subcutaneous Q24H  . folic acid  1 mg Oral Daily  . multivitamin with minerals  1 tablet Oral Daily  . potassium chloride  10 mEq Intravenous Once  . potassium chloride SA  40 mEq Oral Once  . potassium chloride  40 mEq Oral Q3H  . sodium chloride  3 mL Intravenous Q12H  . thiamine  100 mg Oral Daily   Continuous Infusions: . sodium chloride 50 mL/hr at 01/27/15 0139   acetaminophen **OR** acetaminophen, morphine injection, nitroGLYCERIN, ondansetron **OR** ondansetron (ZOFRAN) IV, polyethylene glycol    PHYSICAL EXAM Filed Vitals:   01/27/15 0630 01/27/15 1327 01/27/15 2032 01/28/15 0512  BP: 144/80 146/78 151/84 142/98  Pulse: 74 74 69 70  Temp: 98.7 F (37.1 C) 98 F (36.7 C) 98 F (36.7 C) 98.1 F (36.7 C)  TempSrc: Oral Oral Oral Oral  Resp: 18 18 18 18   Height:      Weight:      SpO2: 97% 98% 98% 97%   Well developed and nourished in no acute distress HENT normal Neck supple with JVP-flat Clear Regular rate and rhythm, no murmurs or gallops Abd-soft with active BS No Clubbing cyanosis edema Skin-warm and dry A & Oriented  Grossly normal  sensory and motor function   TELEMETRY: Reviewed telemetry pt in  nsr:    Intake/Output Summary (Last 24 hours) at 01/28/15 0734 Last data filed at 01/27/15 2239  Gross per 24 hour  Intake 1291.33 ml  Output   1300 ml  Net  -8.67 ml    LABS: Basic Metabolic Panel:  Recent Labs Lab 01/22/15 0820  01/23/15 0335 01/24/15 0400 01/25/15 1600 01/26/15 0501 01/26/15 2130 01/27/15 0248 01/27/15 0825 01/28/15 0437  NA 137  < > 137 131* 134* 135 137  --  138 138  K 3.4*  < > 4.3 3.6 3.3* 3.7 3.4*  --  3.4* 3.3*  CL 112*  < > 109 101 102 104 105  --  106 105  CO2 15*  < > 17* 24 28 25 23   --  22 24  GLUCOSE 124*  < > 135* 95 121* 102* 116*  --  141* 92  BUN 12  < > 15 8 8 10 9   --  9 7  CREATININE 0.59*  < > 0.97 0.65 0.76 0.72 0.73 0.70 0.72 0.72  CALCIUM 8.1*  < > 8.0* 8.1* 8.1* 8.1* 8.7*  --  8.4*  8.4*  MG 1.8  --   --   --   --   --   --   --   --  1.9  < > = values in this interval not displayed. Cardiac Enzymes:  Recent Labs  01/27/15 0248 01/27/15 0825 01/27/15 1226  TROPONINI 0.10* 0.07* 0.06*   CBC:  Recent Labs Lab 01/24/15 0400 01/25/15 0430 01/25/15 1600 01/26/15 0501 01/26/15 2130 01/27/15 0248 01/27/15 0825  WBC 18.8* 16.1* 13.2* 14.3* 13.2* 11.9* 11.2*  NEUTROABS 15.5*  --  9.5*  --   --   --   --   HGB 11.1* 11.3* 11.6* 11.9* 13.1 12.1* 11.7*  HCT 31.7* 33.0* 34.0* 34.8* 37.3* 34.9* 34.4*  MCV 80.7 80.3 81.0 80.7 80.4 79.9 80.2  PLT 95* 125* 134* 152 165 173 171   PROTIME: No results for input(s): LABPROT, INR in the last 72 hours. Liver Function Tests:  Recent Labs  01/25/15 1600 01/27/15 0825  AST 396* 147*  ALT 136* 105*  ALKPHOS 64 53  BILITOT 0.8 0.5  PROT 6.0* 5.5*  ALBUMIN 3.0* 2.9*   No results for input(s): LIPASE, AMYLASE in the last 72 hours. BNP: BNP (last 3 results)  Recent Labs  01/26/15 2130  BNP 140.1*    ProBNP (last 3 results) No results for input(s): PROBNP in the last 8760 hours.  D-Dimer: No  results for input(s): DDIMER in the last 72 hours. Hemoglobin A1C: No results for input(s): HGBA1C in the last 72 hours. Fasting Lipid Panel:  Recent Labs  01/28/15 0437  CHOL 126  HDL 26*  LDLCALC 82  TRIG 91  CHOLHDL 4.8   Thyroid Function Tests:  Recent Labs  01/28/15 0437  TSH 3.300   Anemia Panel: No results for input(s): VITAMINB12, FOLATE, FERRITIN, TIBC, IRON, RETICCTPCT in the last 72 hours.  Flecainide Challenge >>Neg  SAECG pending   ASSESSMENT AND PLAN:  Principal Problem:   Cardiac arrest Active Problems:   Posttraumatic stress disorder   Hypokalemia  For Cardiac MRI today as well as Epi infusion  MRI should not be bothered by dental posts per DM  S ICD in am  Mapping ok Hypokalemic >>replete generously  Not sure why but seemingly new  Orders written  Signed, Virl Axe MD  01/28/2015

## 2015-01-29 NOTE — Anesthesia Procedure Notes (Signed)
Procedure Name: Intubation Date/Time: 01/29/2015 1:39 PM Performed by: Izora Gala Pre-anesthesia Checklist: Patient identified, Emergency Drugs available, Suction available and Patient being monitored Patient Re-evaluated:Patient Re-evaluated prior to inductionOxygen Delivery Method: Circle system utilized Preoxygenation: Pre-oxygenation with 100% oxygen Intubation Type: IV induction Ventilation: Mask ventilation without difficulty Laryngoscope Size: Miller and 3 Grade View: Grade I Tube type: Oral Tube size: 7.5 mm Number of attempts: 1 Airway Equipment and Method: Stylet Placement Confirmation: ETT inserted through vocal cords under direct vision,  positive ETCO2,  CO2 detector and breath sounds checked- equal and bilateral Secured at: 22 cm Tube secured with: Tape Dental Injury: Teeth and Oropharynx as per pre-operative assessment

## 2015-01-29 NOTE — Anesthesia Preprocedure Evaluation (Addendum)
Anesthesia Evaluation  Patient identified by MRN, date of birth, ID band  Reviewed: Allergy & Precautions, NPO status , Patient's Chart, lab work & pertinent test results  Airway Mallampati: I       Dental   Pulmonary Current Smoker,    Pulmonary exam normal       Cardiovascular + CAD and + Past MI Normal cardiovascular exam    Neuro/Psych Depression    GI/Hepatic (+)     substance abuse  cocaine use and IV drug use, Hepatitis -, C  Endo/Other    Renal/GU      Musculoskeletal   Abdominal   Peds  Hematology   Anesthesia Other Findings PTSD  Reproductive/Obstetrics                           Anesthesia Physical Anesthesia Plan  ASA: III  Anesthesia Plan: MAC   Post-op Pain Management:    Induction: Intravenous  Airway Management Planned: Mask  Additional Equipment:   Intra-op Plan:   Post-operative Plan:   Informed Consent: I have reviewed the patients History and Physical, chart, labs and discussed the procedure including the risks, benefits and alternatives for the proposed anesthesia with the patient or authorized representative who has indicated his/her understanding and acceptance.     Plan Discussed with: CRNA, Anesthesiologist and Surgeon  Anesthesia Plan Comments:         Anesthesia Quick Evaluation

## 2015-01-29 NOTE — Progress Notes (Signed)
1705 Pt pack to room from procedure. Pt Alert and verbally responsive; telemetry reapplied and verified; VSS; chest incision dsgs clean, dry and intact; no stain or active bleeding noted; will carry out orders and closely monitor pt per protocol. Francis Gaines Cottrell Gentles RN.

## 2015-01-30 ENCOUNTER — Inpatient Hospital Stay (HOSPITAL_COMMUNITY): Payer: Self-pay

## 2015-01-30 DIAGNOSIS — Z95810 Presence of automatic (implantable) cardiac defibrillator: Secondary | ICD-10-CM

## 2015-01-30 LAB — CBC
HCT: 37 % — ABNORMAL LOW (ref 39.0–52.0)
Hemoglobin: 12.4 g/dL — ABNORMAL LOW (ref 13.0–17.0)
MCH: 27.4 pg (ref 26.0–34.0)
MCHC: 33.5 g/dL (ref 30.0–36.0)
MCV: 81.9 fL (ref 78.0–100.0)
PLATELETS: 284 10*3/uL (ref 150–400)
RBC: 4.52 MIL/uL (ref 4.22–5.81)
RDW: 14.2 % (ref 11.5–15.5)
WBC: 15.8 10*3/uL — ABNORMAL HIGH (ref 4.0–10.5)

## 2015-01-30 LAB — HCV RNA QUANT: HCV QUANT: NOT DETECTED [IU]/mL (ref >50)

## 2015-01-30 LAB — GLUCOSE, CAPILLARY: Glucose-Capillary: 125 mg/dL — ABNORMAL HIGH (ref 65–99)

## 2015-01-30 MED ORDER — OXYCODONE-ACETAMINOPHEN 5-325 MG PO TABS: 1.0000 | ORAL_TABLET | Freq: Four times a day (QID) | ORAL | Status: DC | PRN

## 2015-01-30 MED ORDER — CARVEDILOL 3.125 MG PO TABS: 3.1250 mg | ORAL_TABLET | Freq: Two times a day (BID) | ORAL | Status: DC

## 2015-01-30 MED ORDER — LISINOPRIL 5 MG PO TABS: 5.0000 mg | ORAL_TABLET | Freq: Every day | ORAL | Status: DC

## 2015-01-30 NOTE — Progress Notes (Signed)
Pt given discharge instructions. Wife present at the bedside. Pt a&o x4 and verbalized understanding. IV and tele removed. All belongings left with wife on cart.   [name] [name]

## 2015-01-30 NOTE — Discharge Instructions (Signed)
-  Start taking lisinopril 5 mg daily and carvedilol 3.125 mg twice a day for your heart and high blood pressure -Stop taking aspirin daily for now -Take percocet every 8 hrs as needed for pain -Please attend your follow-up appointments with our clinic and cardiology  -Very nice meeting you!

## 2015-01-30 NOTE — Progress Notes (Signed)
Subjective:  Pt seen and examined in AM. No acute events overnight. He had ICD placement yesterday. He continues to have mild chest pain but denies dyspnea, palpitations, lightheadedness, or syncope.    Objective: Vital signs in last 24 hours: Filed Vitals:   01/29/15 2112 01/29/15 2141 01/30/15 0400 01/30/15 0941  BP: 178/98 152/84 170/94 144/84  Pulse: 93 72 74 72  Temp: 98.3 F (36.8 C)  98.2 F (36.8 C) 98.2 F (36.8 C)  TempSrc: Oral  Oral Oral  Resp: 21  19 18   Height:      Weight:      SpO2: 99%  98% 98%   Weight change:   Intake/Output Summary (Last 24 hours) at 01/30/15 1106 Last data filed at 01/30/15 1026  Gross per 24 hour  Intake   1356 ml  Output      0 ml  Net   1356 ml   PHYSICAL EXAMINATION:  General: NAD  Heart: Regular rate and rhythm with no murmurs. CDI dressing on ICD and leads. Lungs: Clear to auscultation with no wheezing, ronchi, or rales.  Abdomen: Soft, non-tender, non-distended, normal BS Extremities: No edema  Neuro: A & O x 3  Lab Results: Basic Metabolic Panel:  Recent Labs Lab 01/28/15 0437 01/29/15 0630  NA 138 139  K 3.3* 3.8  CL 105 105  CO2 24 26  GLUCOSE 92 98  BUN 7 7  CREATININE 0.72 0.74  CALCIUM 8.4* 8.6*  MG 1.9  --    Liver Function Tests:  Recent Labs Lab 01/27/15 0825 01/29/15 0630  AST 147* 53*  ALT 105* 69*  ALKPHOS 53 56  BILITOT 0.5 0.5  PROT 5.5* 5.7*  ALBUMIN 2.9* 2.9*    CBC:  Recent Labs Lab 01/24/15 0400  01/25/15 1600  01/29/15 0630 01/30/15 0343  WBC 18.8*  < > 13.2*  < > 13.9* 15.8*  NEUTROABS 15.5*  --  9.5*  --   --   --   HGB 11.1*  < > 11.6*  < > 11.8* 12.4*  HCT 31.7*  < > 34.0*  < > 34.8* 37.0*  MCV 80.7  < > 81.0  < > 81.3 81.9  PLT 95*  < > 134*  < > 242 284  < > = values in this interval not displayed. Cardiac Enzymes:  Recent Labs Lab 01/27/15 0248 01/27/15 0825 01/27/15 1226  TROPONINI 0.10* 0.07* 0.06*   CBG:  Recent Labs Lab 01/25/15 2227  01/26/15 0833 01/26/15 1214 01/27/15 0627 01/29/15 0547 01/30/15 0603  GLUCAP 143* 105* 186* 86 94 125*    Urine Drug Screen: Drugs of Abuse     Component Value Date/Time   LABOPIA NONE DETECTED 01/21/2015 0915   LABOPIA NEG 02/11/2010 2105   COCAINSCRNUR NONE DETECTED 01/21/2015 0915   COCAINSCRNUR NEG 02/11/2010 2105   LABBENZ POSITIVE* 01/21/2015 0915   LABBENZ NEG 02/11/2010 2105   AMPHETMU NONE DETECTED 01/21/2015 0915   AMPHETMU NEG 02/11/2010 2105   THCU POSITIVE* 01/21/2015 0915   LABBARB NONE DETECTED 01/21/2015 0915     Micro Results: Recent Results (from the past 240 hour(s))  MRSA PCR Screening     Status: None   Collection Time: 01/21/15 11:17 AM  Result Value Ref Range Status   MRSA by PCR NEGATIVE NEGATIVE Final    Comment:        The GeneXpert MRSA Assay (FDA approved for NASAL specimens only), is one component of a comprehensive MRSA colonization surveillance program. It  is not intended to diagnose MRSA infection nor to guide or monitor treatment for MRSA infections.   MRSA PCR Screening     Status: None   Collection Time: 01/29/15  6:35 AM  Result Value Ref Range Status   MRSA by PCR NEGATIVE NEGATIVE Final    Comment:        The GeneXpert MRSA Assay (FDA approved for NASAL specimens only), is one component of a comprehensive MRSA colonization surveillance program. It is not intended to diagnose MRSA infection nor to guide or monitor treatment for MRSA infections.    Studies/Results: Mr Card Morphology Wo/w Cm  01/28/2015   CLINICAL DATA:  Cardiac arrest  EXAM: CARDIAC MRI  TECHNIQUE: The patient was scanned on a 1.5 Tesla GE magnet. A dedicated cardiac coil was used. Functional imaging was done using Fiesta sequences. 2,3, and 4 chamber views were done to assess for RWMA's. Modified Simpson's rule using a short axis stack was used to calculate an ejection fraction on a dedicated work Conservation officer, nature. The patient received 23  cc of Multihance. After 10 minutes inversion recovery sequences were used to assess for infiltration and scar tissue.  CONTRAST:  23 cc  FINDINGS: Limited images of the lung fields showed a trivial left pleural effusion.  There was a prominent epicardial fat pad. The left ventricle was normal in size and systolic function, EF 46%. There was mild concentric LV hypertrophy. Normal wall motion. Normal right ventricular size and systolic function. No regional wall motion abnormalities or localized aneurysms. T1 imaging showed no evidence for fibrofatty infiltration of the RV myocardium. The atria were normal in size. Trileaflet aortic valve with no stenosis or regurgitation. No significant mitral regurgitation was noted.  On delayed enhancement imaging, there was a small area of possible mid-wall late gadolinium enhancement (LGE) in the mid inferior wall. This was seen only on the short axis images and was not noted on the axials.  MEASUREMENTS: MEASUREMENTS LV EDV 112 mL  LV SV 65 mL  LV EF 58%  IMPRESSION: 1. Normal LV size and systolic function, EF 56%. Mild concentric LV hypertrophy. No evidence for hypertrophic cardiomyopathy.  2.  Normal RV size and systolic function, no evidence for ARVC.  3. On delayed enhancement imaging, there was a possible very small area of mid-wall LGE in the mid inferior wall. This was seen only on the short axis images and not reproduced on the axial images. This did not appear to be a coronary disease pattern. This could indicate a small area of scar or inflammation but is not specific for any particular process.  Dalton Mclean   Electronically Signed   By: Loralie Champagne M.D.   On: 01/28/2015 17:26   Medications: I have reviewed the patient's current medications. Scheduled Meds: . aspirin  81 mg Oral Daily  . carvedilol  3.125 mg Oral BID WC  . enoxaparin (LOVENOX) injection  40 mg Subcutaneous Q24H  . folic acid  1 mg Oral Daily  . lisinopril  5 mg Oral Daily  . multivitamin  with minerals  1 tablet Oral Daily  . sodium chloride  3 mL Intravenous Q12H  . thiamine  100 mg Oral Daily   Continuous Infusions:   PRN Meds:.[DISCONTINUED] acetaminophen **OR** acetaminophen, acetaminophen, morphine injection, nitroGLYCERIN, ondansetron (ZOFRAN) IV, ondansetron **OR** [DISCONTINUED] ondansetron (ZOFRAN) IV, polyethylene glycol Assessment/Plan:  Sudden Unexplained Vfib Arrest s/p ICD - Currently with mild CP in setting of extended CPR of 45 minutes. Left heart catherization  with clean coronary arteries and normal LV EF. Etiology unclear, possibly due to channelopathy per cardiology. Pt received flecainide to assess for Brugada syndrome which was negative. Cardiac MRI was normal with normal EF 58%.  -Appreciate cardiology recommendations   -Percocet 5-325 mg Q 8 hr PRN pain on discharge (#10 pills) -Per cardiology hold aspirin 81 mg daily for now  -Continue newly started carvedilol 3.125 mg BID and lisinopril 5 mg daily per cardiology  Hypertension - Currently hypertensive.  -Continue newly started carvedilol 3.125 mg BID and lisinopril 5 mg daily per cardiology -Blood pressure recheck at follow-up visit   Elevated AST/ALT setting of ischemia - AST/ALT improved to 53/69 from 147/105 most likely in setting of cardiac ischemia. Pt reports socially drinking alcohol (6 cans per week).  -Obtain CMP at follow-up visit to ensure resolution -Counsel on alcohol cessation   Treated Hepatitis C Infection - Pt has been treated for HCV infection with interferon and ribavirin at [facility] with undetectable viral load. Pt is immune to Hep A.  -Administer Hepatitis B vaccination if not immune, labs pending   Marijuana Use - Pt with UDS positive for THC. -Counsel on cessation   Tobacco Abuse - Pt reports smoking 1.5 ppd since age 37. Pt reports he is going to quit smoking. -Counsel on cessation   HIV Screening - Pt tested negative for annual screening.  Dispo: Disposition is deferred at  this time, awaiting improvement of current medical problems.  Anticipated discharge is today.   The patient does have a current PCP Otho Bellows, MD) and does need an [facility] hospital follow-up appointment after discharge.  The patient does not have transportation limitations that hinder transportation to clinic appointments.  .Services Needed at time of discharge: Y = Yes, Blank = No PT:  No  OT:  No  RN:  No  Equipment:  None  Other:  None    LOS: 3 days   Juluis Mire, MD 01/30/2015, 11:06 AM

## 2015-01-30 NOTE — Progress Notes (Signed)
Subjective:    Patient received ICD yesterday 6/3. He complained of severe chest soreness overnight that has improved with morphine. He is ambulating well and tolerating a regular diet. He denies any dyspnea, palpitations, or light-headedness.    Objective:    Vital Signs:   Temp:  [97.8 F (36.6 C)-98.3 F (36.8 C)] 98.2 F (36.8 C) (06/04 0941) Pulse Rate:  [66-93] 72 (06/04 0941) Resp:  [16-21] 18 (06/04 0941) BP: (144-178)/(83-100) 144/84 mmHg (06/04 0941) SpO2:  [98 %-100 %] 98 % (06/04 0941) Last BM Date: 01/28/15  24-hour weight change: Weight change:   Intake/Output:   Intake/Output Summary (Last 24 hours) at 01/30/15 1417 Last data filed at 01/30/15 1026  Gross per 24 hour  Intake   1356 ml  Output      0 ml  Net   1356 ml      Physical Exam: General: NAD, lying in bed, alert and cooperative Heart: Regular rate and rhythm with no murmurs  Lungs: Clear to auscultation with no wheezing, ronchi, or rales.  Abdomen: Soft, non-tender, non-distended, normal active BS Extremities: No lower extremity edema, warm and well perfused    Labs:  Basic Metabolic Panel:  Recent Labs Lab 01/26/15 0501 01/26/15 2130 01/27/15 0248 01/27/15 0825 01/28/15 0437 01/29/15 0630  NA 135 137  --  138 138 139  K 3.7 3.4*  --  3.4* 3.3* 3.8  CL 104 105  --  106 105 105  CO2 25 23  --  22 24 26   GLUCOSE 102* 116*  --  141* 92 98  BUN 10 9  --  9 7 7   CREATININE 0.72 0.73 0.70 0.72 0.72 0.74  CALCIUM 8.1* 8.7*  --  8.4* 8.4* 8.6*  MG  --   --   --   --  1.9  --     Liver Function Tests:  Recent Labs Lab 01/25/15 1600 01/27/15 0825 01/29/15 0630  AST 396* 147* 53*  ALT 136* 105* 69*  ALKPHOS 64 53 56  BILITOT 0.8 0.5 0.5  PROT 6.0* 5.5* 5.7*  ALBUMIN 3.0* 2.9* 2.9*   CBC:  Recent Labs Lab 01/24/15 0400  01/25/15 1600  01/26/15 2130 01/27/15 0248 01/27/15 0825 01/29/15 0630 01/30/15 0343  WBC 18.8*  < > 13.2*  < > 13.2* 11.9* 11.2* 13.9* 15.8*    NEUTROABS 15.5*  --  9.5*  --   --   --   --   --   --   HGB 11.1*  < > 11.6*  < > 13.1 12.1* 11.7* 11.8* 12.4*  HCT 31.7*  < > 34.0*  < > 37.3* 34.9* 34.4* 34.8* 37.0*  MCV 80.7  < > 81.0  < > 80.4 79.9 80.2 81.3 81.9  PLT 95*  < > 134*  < > 165 173 171 242 284  < > = values in this interval not displayed.  Cardiac Enzymes:  Recent Labs Lab 01/27/15 0248 01/27/15 0825 01/27/15 1226  TROPONINI 0.10* 0.07* 0.06*    CBG:  Recent Labs Lab 01/26/15 0833 01/26/15 1214 01/27/15 0627 01/29/15 0547 01/30/15 0603  GLUCAP 105* 186* 86 94 125*    Microbiology: Other results: EKG: Normal sinus rhythm, no ST changes, no change from baseline.   Imaging: Dg Chest 2 View  01/30/2015   CLINICAL DATA:  Cardiac device placement.  EXAM: CHEST  2 VIEW  COMPARISON:  Radiograph 01/26/2015  FINDINGS: Generator in the left the chest wall laterally. Single continuous leads  extends along the left sternal border within the subcutaneous tissue.  No pulmonary edema or pneumothorax. There is mild atelectasis the left lung base similar prior.  IMPRESSION: Interval placement of subcutaneous ICD.   Electronically Signed   By: Suzy Bouchard M.D.   On: 01/30/2015 11:08     Medications:     Scheduled Medications: . aspirin  81 mg Oral Daily  . carvedilol  3.125 mg Oral BID WC  . folic acid  1 mg Oral Daily  . lisinopril  5 mg Oral Daily  . multivitamin with minerals  1 tablet Oral Daily  . sodium chloride  3 mL Intravenous Q12H  . thiamine  100 mg Oral Daily    PRN Medications: [DISCONTINUED] acetaminophen **OR** acetaminophen, acetaminophen, morphine injection, nitroGLYCERIN, ondansetron (ZOFRAN) IV, ondansetron **OR** [DISCONTINUED] ondansetron (ZOFRAN) IV, polyethylene glycol   Assessment/ Plan:   54 year old man with no significant PMHx who presented with sudden VF cardiac arrest, now s/p ICD placement.   Principal Problem:   Cardiac arrest Active Problems:   TOBACCO ABUSE    Posttraumatic stress disorder   Hypokalemia    Sudden Vfib Arrest - Sudden VFib arrest in the setting of normal LV function and clean coronary arteries on left heart catheterization (01/26/15). Patient is now 1 day s/p subcutaneous ICD placement. Differential includes inherited arrhythmia syndromes, possibly due to a channelopathy such as Brugada syndrome or long QT syndrome. However, QTc not prolonged on 12-lead ECG. Labs significant for mild hypokalemia. Flecainide test for Brugada syndrome was unrevealing. Cardiac MRI with no significant abnormalities.  -Appreciate cardiology recommendations  -Continue aspirin 81 mg daily for secondary CVD prevention -Pain control: IV morphine 1 mg Q 4 hr PRN pain, Percocent (10 pills) at discharge -Follow-up appointment scheduled with Dr. Caryl Comes in Cardiology   HTN: Patient noted to be hypertensive up to 170s/100s. Pain likely contributing. -Started carvedilol 3.125 mg BID and lisinopril 5 mg daily   Transaminitis in setting of ischemia and treated HCV infection - AST/ALT improved to 53/69 from 147/105 most likely in setting of cardiac ischemia. Pt has been treated for HCV infection with interferon and ribavirin at [facility]. Pt reports socially drinking alcohol (6 cans per week).  -Follow-up HCV viral load  -Confirmed immunity to Hepatitis A, non-immune to Hepatitis B. Recommend Hepatitis B vaccination in outpatient setting. -Multivitamin, folic acid, and thiamine daily   Marijuana Use - Pt with UDS positive for THC. -Counsel on cessation in outpatient setting  Tobacco Abuse - Pt reports smoking 1.5 ppd since age 3. Pt reports he is going to quit smoking. -Counsel on cessation in outpatient setting  HIV Screening - Pt tested negative in annual screening.  Dispo: Discharged home 01/30/15 with follow up appointments scheduled with Cardiology and Internal medicine  The patient does have a current PCP Otho Bellows, MD) and does need an [facility] hospital  follow-up appointment after discharge.  The patient does not have transportation limitations that hinder transportation to clinic appointments.   SERVICE NEEDED AT [place]         Y = Yes, Blank = No PT:   OT:   RN:   Equipment:   Other:      Length of Stay: 3 day(s)   Signed: Betsey Amen, MS4

## 2015-01-30 NOTE — Discharge Summary (Signed)
Name: [name] [name] MRN: [id number] DOB: May 21, 1961 54 y.o. PCP: Otho Bellows, MD  Date of Admission: 01/26/2015  9:25 PM Date of Discharge: 01/30/2015 Attending Physician: Dr. Aldine Contes MD  Discharge Diagnosis:  Unexplained Vfib Arrest s/p ICD  Hypertension Elevated AST/ALT setting of ischemia Hypokalemia  Alcohol Use  Treated Hepatitis C Infection  Marijuana Use  Tobacco Abuse HIV Screening       Discharge Medications:   Medication List    STOP taking these medications        amoxicillin-clavulanate 875-125 MG per tablet  Commonly known as:  AUGMENTIN     aspirin 81 MG tablet     atorvastatin 80 MG tablet  Commonly known as:  LIPITOR     ibuprofen 200 MG tablet  Commonly known as:  ADVIL,MOTRIN     metoprolol tartrate 25 MG tablet  Commonly known as:  LOPRESSOR      TAKE these medications        carvedilol 3.125 MG tablet  Commonly known as:  COREG  Take 1 tablet (3.125 mg total) by mouth 2 (two) times daily with a meal.     lisinopril 5 MG tablet  Commonly known as:  PRINIVIL,ZESTRIL  Take 1 tablet (5 mg total) by mouth daily.     multivitamin with minerals Tabs tablet  Take 1 tablet by mouth daily.     nitroGLYCERIN 0.4 MG SL tablet  Commonly known as:  NITROSTAT  Place 0.4 mg under the tongue every 5 (five) minutes as needed for chest pain.     oxyCODONE-acetaminophen 5-325 MG per tablet  Commonly known as:  PERCOCET  Take 1 tablet by mouth every 6 (six) hours as needed.        Disposition and follow-up:   [name] [name] was discharged from [facility] in Good condition.  At the hospital follow up visit please address:  1.  ICD placed on 01/29/15 - ensure no infection, to follow-up with cardiology. Pt cannot drive for 6 months.           Chest soreness after prolonged CPR - given short-term percocet, please assess pain       Compliance with taking newly started carvedilol and lisinopril, uptitrate as  needed, please check renal function and potassium         Resume aspirin 81 mg daily once ok with cardiology       Not immune to hepatitis B - needs hepatitis B vaccination series (0,1, and 6 months)        Ensure resoluton of mild anemia, leukocytosis, and elevated AST/ALT         2.  Labs / imaging needed at time of follow-up: CBC (WBC and Hg), CMP (Cr, K, AST/ALT)  3.  Pending labs/ test needing follow-up: None  Follow-up Appointments:     Follow-up Information    Follow up with Otho Bellows, MD On 02/04/2015.   Specialty:  Internal Medicine   Why:  1:15 PM   Contact information:   [place] [address] [phone]       Follow up with Virl Axe, MD.   Specialty:  Cardiology   Why:  They will call you to set up an appt   Contact information:   [address] [address] [phone]       Discharge Instructions:   -Start taking lisinopril 5 mg daily and carvedilol 3.125 mg twice a  day for your heart and high blood pressure -Stop taking aspirin daily for now -Take percocet every 8 hrs as needed for pain -Please attend your follow-up appointments with our clinic and cardiology  -Very nice meeting you!  Consultations: Treatment Team:  Deboraha Sprang, MD  Procedures Performed:  Dg Chest 2 View  01/30/2015   CLINICAL DATA:  Cardiac device placement.  EXAM: CHEST  2 VIEW  COMPARISON:  Radiograph 01/26/2015  FINDINGS: Generator in the left the chest wall laterally. Single continuous leads extends along the left sternal border within the subcutaneous tissue.  No pulmonary edema or pneumothorax. There is mild atelectasis the left lung base similar prior.  IMPRESSION: Interval placement of subcutaneous ICD.   Electronically Signed   By: Suzy Bouchard M.D.   On: 01/30/2015 11:08   Dg Chest 2 View  01/26/2015   CLINICAL DATA:  Chest pain for 5 days, heart catheterization yesterday  EXAM: CHEST  2 VIEW  COMPARISON:  04/01/2013   FINDINGS: Cardiac shadow is within normal limits. Minimal left basilar atelectasis is seen. No sizable effusion or focal infiltrate is noted. No acute bony abnormality is seen.  IMPRESSION: Mild left basilar atelectasis.   Electronically Signed   By: Inez Catalina M.D.   On: 01/26/2015 21:54   Ct Head Wo Contrast  01/21/2015   ADDENDUM REPORT: 01/21/2015 11:20  ADDENDUM: These results were called by telephone at the time of interpretation on 01/21/2015 at 11:19 am to Dr. Gwendolyn Lima, who verbally acknowledged these results.   Electronically Signed   By: Lowella Grip III M.D.   On: 01/21/2015 11:20   01/21/2015   CLINICAL DATA:  Status post cardiac arrest  EXAM: CT HEAD WITHOUT CONTRAST  TECHNIQUE: Contiguous axial images were obtained from the base of the skull through the vertex without intravenous contrast.  COMPARISON:  None.  FINDINGS: The ventricles are normal in size and configuration. There is no intracranial mass, hemorrhage, extra-axial fluid collection, or midline shift. No focal gray-white compartment lesions are identified.  Both middle cerebral arteries show symmetric increased attenuation. Bony calvarium appears intact. The mastoid air cells are clear.  IMPRESSION: Increased attenuation is noted in both middle cerebral arteries. The significance of this symmetric finding is uncertain. Given the history, the possibility of bilateral middle cerebral artery thrombosis must be of concern. This finding may well warrant CT or MR angiography to further assess. There is no intracranial edema. Gray-white compartments appear normal. No acute hemorrhage evident.  Message left for Dr. Gwendolyn Lima with respect to this report, Jan 21, 2015, 10:56 a.m.  Electronically Signed: By: Lowella Grip III M.D. On: 01/21/2015 10:56   Portable Chest 1 View  01/25/2015   CLINICAL DATA:  Cough and chest pain today.  EXAM: PORTABLE CHEST - 1 VIEW  COMPARISON:  Single view of the chest 01/24/2015 and 01/22/2015.   FINDINGS: Right IJ catheter remains in place. There is a very small left pleural effusion and mild subsegmental atelectasis in the left lung base. The right lung is clear. No pneumothorax.  IMPRESSION: Small left pleural effusion and mild atelectasis in left lung base.   Electronically Signed   By: Inge Rise M.D.   On: 01/25/2015 14:33   Dg Chest Port 1 View  01/24/2015   CLINICAL DATA:  Acute respiratory failure with hypoxia. Follow-up exam.  EXAM: PORTABLE CHEST - 1 VIEW  COMPARISON:  01/22/2015  FINDINGS: Lungs remain hyperexpanded. There is mild basilar opacity likely atelectasis. There is mild  bilateral interstitial thickening that predominate centrally, without substantial change from the most recent prior study allowing for differences in patient positioning and technique. No frank lung consolidation. No gross pneumothorax on this supine study.  No pneumothorax.  Endotracheal tube and nasogastric tube have been removed. Right internal jugular central venous line is stable in well positioned.  IMPRESSION: 1. No change in lung aeration. There may be mild residual interstitial edema, but no evidence of airspace edema. There is underlying COPD. 2. Remaining support apparatus is well positioned.   Electronically Signed   By: Lajean Manes M.D.   On: 01/24/2015 08:53   Dg Chest Port 1 View  01/22/2015   CLINICAL DATA:  Acute respiratory failure with hypoxia  EXAM: PORTABLE CHEST - 1 VIEW  COMPARISON:  01/21/2015  FINDINGS: Support devices remain in stable position. Interval improvement in aeration with resolution of bilateral airspace opacities. No confluent opacities. Heart is normal size. No effusions or pneumothorax. No acute bony abnormality.  IMPRESSION: Interval resolution of bilateral airspace disease. Support devices stable.   Electronically Signed   By: Rolm Baptise M.D.   On: 01/22/2015 13:44   Dg Chest Port 1 View  01/21/2015   CLINICAL DATA:  Status post central line placement  EXAM:  PORTABLE CHEST - 1 VIEW  COMPARISON:  01/21/2015  FINDINGS: Cardiac shadow is stable. An endotracheal tube, nasogastric catheter and right jugular central line are noted in satisfactory position. No pneumothorax is identified. Persistent infiltrative changes are noted within both lungs. No new focal abnormality is seen.  IMPRESSION: Status post right central line placement without evidence of pneumothorax.  The remainder of the exam is stable.   Electronically Signed   By: Inez Catalina M.D.   On: 01/21/2015 10:21   Dg Chest Portable 1 View  01/21/2015   CLINICAL DATA:  Hypoxia  EXAM: PORTABLE CHEST - 1 VIEW  COMPARISON:  April 01, 2013  FINDINGS: Endotracheal tube tip is 1.4 cm above the carina. Nasogastric tube tip and side port are below the diaphragm. No pneumothorax. There is airspace consolidation throughout the right upper lobe. Lungs elsewhere clear. Heart size and pulmonary vascularity are normal. No adenopathy. No focal bone lesions evident.  IMPRESSION: Tube positions as described without pneumothorax. Right upper lobe airspace consolidation. Differential considerations include pneumonia, contusion/hemorrhage, or possible aspiration.   Electronically Signed   By: Lowella Grip III M.D.   On: 01/21/2015 09:02   Mr Card Morphology Wo/w Cm  01/28/2015   CLINICAL DATA:  Cardiac arrest  EXAM: CARDIAC MRI  TECHNIQUE: The patient was scanned on a 1.5 Tesla GE magnet. A dedicated cardiac coil was used. Functional imaging was done using Fiesta sequences. 2,3, and 4 chamber views were done to assess for RWMA's. Modified Simpson's rule using a short axis stack was used to calculate an ejection fraction on a dedicated work Conservation officer, nature. The patient received 23 cc of Multihance. After 10 minutes inversion recovery sequences were used to assess for infiltration and scar tissue.  CONTRAST:  23 cc  FINDINGS: Limited images of the lung fields showed a trivial left pleural effusion.  There was a  prominent epicardial fat pad. The left ventricle was normal in size and systolic function, EF 33%. There was mild concentric LV hypertrophy. Normal wall motion. Normal right ventricular size and systolic function. No regional wall motion abnormalities or localized aneurysms. T1 imaging showed no evidence for fibrofatty infiltration of the RV myocardium. The atria were normal in size. Trileaflet  aortic valve with no stenosis or regurgitation. No significant mitral regurgitation was noted.  On delayed enhancement imaging, there was a small area of possible mid-wall late gadolinium enhancement (LGE) in the mid inferior wall. This was seen only on the short axis images and was not noted on the axials.  MEASUREMENTS: MEASUREMENTS LV EDV 112 mL  LV SV 65 mL  LV EF 58%  IMPRESSION: 1. Normal LV size and systolic function, EF 02%. Mild concentric LV hypertrophy. No evidence for hypertrophic cardiomyopathy.  2.  Normal RV size and systolic function, no evidence for ARVC.  3. On delayed enhancement imaging, there was a possible very small area of mid-wall LGE in the mid inferior wall. This was seen only on the short axis images and not reproduced on the axial images. This did not appear to be a coronary disease pattern. This could indicate a small area of scar or inflammation but is not specific for any particular process.  Dalton Mclean   Electronically Signed   By: Loralie Champagne M.D.   On: 01/28/2015 17:26    2D Echo: None  Cardiac Cath: 01/26/15  1. Angiographically Normal Coronary Arteries 2. Preserved LV EF with essentially normal LVEDP  Likely non-ischemic etiology for Cardiac Arrest.  Also anticipate that Low EF was post-arrest mediated.  3. Standard post femoral cath care - transferred to Tele 4. Consider EP evaluation for primary arrythmia 5. Continue risk factor modification   Admission HPI: Original Author Allyne Gee MD  [name] [name] is a 54 y.o. male with recent cardiac arrest left  AMA it appears twice. Patient was originally admitted on May 26 with a full arrest. Patient was resuscitated and admitted to the ICU under hypothermia protocol. He had a cardiac cath done which was done this morning and showed no CAD. He apparently left because he states he was being treated poorly. Patient was convinced by his family to come back to the hospital. Patient now returns for further evaluation. He has no complaints at this time other than the chest pain from his CPR. Patient was supposed to see the EP physician which has not been done yet.   Hospital Course by problem list:  Unexplained Vfib Arrest s/p ICD - Pt was hospitalized for sudden vfib arrest on 5/26 after heavy lifting at work and found to have possible seizure activity by his family. He required 45 minutes of CPR/defrbillation before ROSC requiring ICU admission with hypothermic protocol. His mental status improved to normal. He had left heart catherization prior to being readmitted that revealed no evidence of CAD and preserved EF. He left AMA on 5/30 but returned on 5/31. He had subcutaneous ICD placement on 01/29/15 by Dr. Caryl Comes. Etiology of sudden vfib arrest unclear, possibly due to channelopathy vs congenital cause per cardiology. Pt with family history of MI in paternal grandmother and father with open heart surgery. Pt received flecainide to assess for Brugada syndrome which was negative. Epinephrine challenge was suggested to assess for long QT syndrome but was not performed during hospitalization. Cardiac MRI was normal with normal EF of 58%. EKG with normal to mildly prolonged QTc (452). Risk stratification with lipid panel, TSH, and A1c was within normal limits. Pt was counseled on tobacco cessation. Pt was started by cardiology on carvedilol 3.125 mg BID and lisinopril 5 mg daily. Due to persistent chest soreness, pt was given percocet 5-325 mg Q 8 hr as needed for pain on discharge (#10 pills) per cardiology request. Per  cardiology  he is to hold aspirin 81 mg daily in setting of newly placed ICD. Pt was scheduled for follow-up with cardiology and [facility] outpatient clinic. Pt was instructed by cardiology to not drive for 6 months which he verbalized understanding.    Hypertension - Pt with blood pressure range of 119/68 -178/84 during hospitalization. Pt was started by cardiology on carvedilol 3.125 mg BID and lisinopril 5 mg daily. Pt to have blood pressure recheck and BMP at hospital follow-up visit with uptitration of medications as needed.    Elevated AST/ALT setting of ischemia - Pt with AST/ALT 147/105 on admission that improved to 53/69 most likely in setting of ischemia from vfib arrest. Pt to have liver function checked at follow-up visit to ensure resolution.   Hypokalemia - Pt with mild hypokalemia with normal magnesium during hospitalization that normalized with repletion. Etiology unclear, possibly due to decreased dietary intake.    Alcohol Use - Pt reported socially drinking alcohol. Pt was counseled on alcohol cessation during hospitalization.    Treated Hepatitis C Infection - Pt with prior treated HCV infection with interferon and ribavirin at [facility] found to have undetectable viral load. Pt found to be immune to hepatitis A but not immune to hepatitis B. Pt to have Hepatitis B vaccination series at outpatient follow-up visit.    Marijuana Use - Pt with UDS positive for THC. Pt was counseled on cessation.  Tobacco Abuse - Pt reported smoking 1.5 ppd since age 16. Pt was counseled on cessation and reported he was going to quit smoking.   HIV Screening - Pt tested negative for annual HIV screening.       Discharge Vitals:   BP 144/84 mmHg  Pulse 72  Temp(Src) 98.2 F (36.8 C) (Oral)  Resp 18  Ht 5\' 10"  (1.778 m)  Wt 143 lb 15.4 oz (65.3 kg)  BMI 20.66 kg/m2  SpO2 98%  Discharge Labs:  Results for orders placed or performed during the hospital encounter of 01/26/15 (from the past 24  hour(s))  CBC     Status: Abnormal   Collection Time: 01/30/15  3:43 AM  Result Value Ref Range   WBC 15.8 (H) 4.0 - 10.5 K/uL   RBC 4.52 4.22 - 5.81 MIL/uL   Hemoglobin 12.4 (L) 13.0 - 17.0 g/dL   HCT 37.0 (L) 39.0 - 52.0 %   MCV 81.9 78.0 - 100.0 fL   MCH 27.4 26.0 - 34.0 pg   MCHC 33.5 30.0 - 36.0 g/dL   RDW 14.2 11.5 - 15.5 %   Platelets 284 150 - 400 K/uL  Glucose, capillary     Status: Abnormal   Collection Time: 01/30/15  6:03 AM  Result Value Ref Range   Glucose-Capillary 125 (H) 65 - 99 mg/dL   Comment 1 Notify RN    Comment 2 Document in Chart     Signed: Juluis Mire, MD 01/30/2015, 5:43 PM    Services Ordered on Discharge: None Equipment Ordered on Discharge: None

## 2015-01-30 NOTE — Progress Notes (Addendum)
SUBJECTIVE: The patient is doing well today.  At this time, he denies shortness of breath, or any new concerns.  Ambulatory in the halls.  Does not appear to be having substantial pain but does complain of chest soreness.  Marland Kitchen aspirin  81 mg Oral Daily  . carvedilol  3.125 mg Oral BID WC  . folic acid  1 mg Oral Daily  . lisinopril  5 mg Oral Daily  . multivitamin with minerals  1 tablet Oral Daily  . sodium chloride  3 mL Intravenous Q12H  . thiamine  100 mg Oral Daily      OBJECTIVE: Physical Exam: Filed Vitals:   01/29/15 2112 01/29/15 2141 01/30/15 0400 01/30/15 0941  BP: 178/98 152/84 170/94 144/84  Pulse: 93 72 74 72  Temp: 98.3 F (36.8 C)  98.2 F (36.8 C) 98.2 F (36.8 C)  TempSrc: Oral  Oral Oral  Resp: 21  19 18   Height:      Weight:      SpO2: 99%  98% 98%    Intake/Output Summary (Last 24 hours) at 01/30/15 1237 Last data filed at 01/30/15 1026  Gross per 24 hour  Intake   1356 ml  Output      0 ml  Net   1356 ml    Telemetry reveals sinus rhythm  GEN- The patient is well appearing, alert and oriented x 3 today.  ambulatory Head- normocephalic, atraumatic Eyes-  Sclera clear, conjunctiva pink Ears- hearing intact Oropharynx- clear Neck- supple,  Lungs- Clear to ausculation bilaterally, normal work of breathing Heart- Regular rate and rhythm, no murmurs, rubs or gallops, PMI not laterally displaced GI- soft, NT, ND, + BS Extremities- no clubbing, cyanosis, or edema Skin- no bleeding, moderate L side hematoma Psych- euthymic mood, full affect Neuro- strength and sensation are intact  LABS: Basic Metabolic Panel:  Recent Labs  01/28/15 0437 01/29/15 0630  NA 138 139  K 3.3* 3.8  CL 105 105  CO2 24 26  GLUCOSE 92 98  BUN 7 7  CREATININE 0.72 0.74  CALCIUM 8.4* 8.6*  MG 1.9  --    Liver Function Tests:  Recent Labs  01/29/15 0630  AST 53*  ALT 69*  ALKPHOS 56  BILITOT 0.5  PROT 5.7*  ALBUMIN 2.9*   No results for input(s):  LIPASE, AMYLASE in the last 72 hours. CBC:  Recent Labs  01/29/15 0630 01/30/15 0343  WBC 13.9* 15.8*  HGB 11.8* 12.4*  HCT 34.8* 37.0*  MCV 81.3 81.9  PLT 242 284   Hemoglobin A1C:  Recent Labs  01/28/15 0437  HGBA1C 5.6   Fasting Lipid Panel:  Recent Labs  01/28/15 0437  CHOL 126  HDL 26*  LDLCALC 82  TRIG 91  CHOLHDL 4.8   Thyroid Function Tests:  Recent Labs  01/28/15 0437  TSH 3.300    ASSESSMENT AND PLAN:  Principal Problem:   Cardiac arrest Active Problems:   TOBACCO ABUSE   Posttraumatic stress disorder   Hypokalemia   Doing well s/p cardiac arrest.  Cardiac MRI reviewed.  Epinephrine challenge was suggested but I do not see that this was done--> will defer to Dr Caryl Comes. ICD incision site looks good. Would keep on low dose coreg and lisinopril given elevated BP here. I will arrange wound check and follow-up with Dr Aquilla Hacker office. No driving x 6 months (pt aware)  Ok to discharge  Electrophysiology team to see as needed while here. Please call with questions.  Thompson Grayer, MD 01/30/2015 12:37 PM

## 2015-01-31 LAB — HEPATITIS B CORE ANTIBODY, TOTAL: Hep B Core Total Ab: NEGATIVE

## 2015-01-31 LAB — HEPATITIS B CORE ANTIBODY, IGM: HEP B C IGM: NEGATIVE

## 2015-01-31 LAB — HEPATITIS B SURFACE ANTIGEN: Hepatitis B Surface Ag: NEGATIVE

## 2015-02-01 ENCOUNTER — Encounter (HOSPITAL_COMMUNITY): Payer: Self-pay | Admitting: Internal Medicine

## 2015-02-02 ENCOUNTER — Other Ambulatory Visit: Payer: Self-pay | Admitting: Internal Medicine

## 2015-02-02 MED ORDER — OXYCODONE-ACETAMINOPHEN 5-325 MG PO TABS: 1.0000 | ORAL_TABLET | Freq: Four times a day (QID) | ORAL | Status: DC | PRN

## 2015-02-02 MED FILL — Heparin Sodium (Porcine) 2 Unit/ML in Sodium Chloride 0.9%: INTRAMUSCULAR | Qty: 500 | Status: AC

## 2015-02-02 NOTE — Progress Notes (Signed)
Rx given to patient for percocet # 10

## 2015-02-02 NOTE — Progress Notes (Signed)
Patient presented to clinic requesting more pain medication for pain from compressions s/p cardiac arrest and subsequent pacemaker placement.  He is due to be see on 02/04/15.  Will give #10 refill of percocet and further assessment of pain will need to take place at appointment.  If he no-shows to appointment, further refills should not be given.   Gilles Chiquito, MD

## 2015-02-03 ENCOUNTER — Telehealth: Payer: Self-pay | Admitting: Internal Medicine

## 2015-02-03 NOTE — Telephone Encounter (Signed)
New Message  Pt was recently discharged from hospital was given instructions to not participate in extreme activities. Pt req a call back for clarification of what activities not to participate in. Please call

## 2015-02-03 NOTE — Telephone Encounter (Signed)
Spoke w/family member and answered questions of activities. Also, confirmed wound check appt on 02-08-15. Pt aware of appt.

## 2015-02-03 NOTE — Telephone Encounter (Signed)
Routing to device clinic to address 

## 2015-02-04 ENCOUNTER — Ambulatory Visit (INDEPENDENT_AMBULATORY_CARE_PROVIDER_SITE_OTHER): Payer: Self-pay | Admitting: Internal Medicine

## 2015-02-04 ENCOUNTER — Encounter: Payer: Self-pay | Admitting: Internal Medicine

## 2015-02-04 VITALS — BP 134/72 | HR 58 | Temp 98.0°F | Wt 145.3 lb

## 2015-02-04 DIAGNOSIS — F17210 Nicotine dependence, cigarettes, uncomplicated: Secondary | ICD-10-CM

## 2015-02-04 DIAGNOSIS — I10 Essential (primary) hypertension: Secondary | ICD-10-CM

## 2015-02-04 DIAGNOSIS — F17200 Nicotine dependence, unspecified, uncomplicated: Secondary | ICD-10-CM

## 2015-02-04 DIAGNOSIS — I469 Cardiac arrest, cause unspecified: Secondary | ICD-10-CM

## 2015-02-04 MED ORDER — OXYCODONE-ACETAMINOPHEN 5-325 MG PO TABS: 1.0000 | ORAL_TABLET | Freq: Four times a day (QID) | ORAL | Status: DC | PRN

## 2015-02-04 NOTE — Assessment & Plan Note (Signed)
Pt is s/p Vfib arrest due to unknown cause with ROSC after 75min and improved mental status after cooling s/p hypothermia protocol. ICD was placed 01/29/15. Pt still with significant pain in the mid sternum, likely due to 24min CPR. He denies pain at ICD site. The pain is keeping him up at night. - Refilling Percocet 5-325mg  po q6h PRN pain, disp #45, 0 refills - F/u with Cardiology on Mon, 6/13 - Continue non-strenuous activities until cleared by Cardiology - F/u in 3 months

## 2015-02-04 NOTE — Assessment & Plan Note (Signed)
Pt states that since coming out of the hospital, he has had only 2 drags of a cigarette but nothing more. I encouraged cessation and the importance of quitting especially in the setting for his recent Vfib arrest.

## 2015-02-04 NOTE — Progress Notes (Signed)
Patient ID: [name] [name], male   DOB: [birth year], 54 y.o.   MRN: [id number]  Subjective:   Patient ID: [name] [name] male   DOB: [birth year] 54 y.o.   MRN: [id number]  HPI: [name] [name] is a 54 y.o. M who presents for a hospital f/u after being admitted with cardiac arrest.  Pt was admitted 5/26 after Vfib arrest with suspected 45 min of CPR b/f ROSC. He was admitted to the ICU and underwent hypothermia protocol. His mental status improved to baseline after rewarming. LHC was performed and showed no CAD w/ preserved EF. An ICD was placed 01/29/15 given his Vfib arrest was of unclear cause, possibly channelopathy vs congenital cause. Lipid panel and TSH were within normal limits. The pt does smoke and was counseled on cessation. He is to see Cardiology on Monday, 6/13.   Overall he is doing well. He is resting at home. He does c/o continued pain in his chest, likely from 79min of CPR with shocks and s/p ICD placement. He states that he is having trouble sleeping due to the pain. The pain is better sitting propped up and is worse lying flat.     Past Medical History  Diagnosis Date  . Cardiac arrest 12/2014  . PULMONARY NODULE 12/21/2009    Stable 9mm per repeat CXR 4/11  . PTSD (post-traumatic stress disorder)     Found deceased brother- resolved per pt  . Tobacco abuse     1 ppd x 68 ys. Quit 04/22/12  . IV drug abuse     Quit. Used in the 33s  . Depression     Resolved- pt contributes to previous drug use  . Anemia   . Substance abuse   . Hemorrhoids   . Hepatitis C     S/p completing treatment at [facility]   Current Outpatient Prescriptions  Medication Sig Dispense Refill  . carvedilol (COREG) 3.125 MG tablet Take 1 tablet (3.125 mg total) by mouth 2 (two) times daily with a meal. 60 tablet 1  . lisinopril (PRINIVIL,ZESTRIL) 5 MG tablet Take 1 tablet (5 mg total) by mouth daily. 30 tablet 1  . Multiple Vitamin (MULTIVITAMIN WITH MINERALS) TABS tablet Take 1 tablet by mouth daily.     . nitroGLYCERIN (NITROSTAT) 0.4 MG SL tablet Place 0.4 mg under the tongue every 5 (five) minutes as needed for chest pain.    Marland Kitchen oxyCODONE-acetaminophen (PERCOCET) 5-325 MG per tablet Take 1 tablet by mouth every 6 (six) hours as needed. 10 tablet 0   No current facility-administered medications for this visit.   Family History  Problem Relation Age of Onset  . Hyperlipidemia Mother   . Hypertension Mother   . COPD Mother   . Colon polyps Mother 16  . Heart disease Father     Decease from MI at 39yo  . Cancer Maternal Grandfather     Colon cancer, deceased  . Alcohol abuse Maternal Grandfather   . Colon cancer Maternal Grandfather 61  . Esophageal cancer Neg Hx   . Rectal cancer Neg Hx    History   Social History  . Marital Status: Married    Spouse Name: N/A  . Number of Children: N/A  . Years of Education: N/A   Social History Main Topics  . Smoking status: Current Every Day Smoker -- 0.50 packs/day for 29 years    Types: Cigarettes  . Smokeless tobacco: Never Used     Comment: Stopped x 3-4 days  . Alcohol  Use: 3.6 oz/week    6 Cans of beer per week     Comment: occassional  . Drug Use: Yes    Special: "Crack" cocaine, Marijuana, Heroin     Comment: Quit IV drugs in the '80s. Last crack use 2009 or 2010.  Marland Kitchen Sexual Activity:    Partners: Female   Other Topics Concern  . Not on file   Social History Narrative   ** Merged History Encounter **       Review of Systems: Constitutional: +fatigue, poor sleep.  HEENT: Denies eye pain or neck pain Respiratory: Denies SOB, DOE, or wheezing.   Cardiovascular: +chest pain at sternum. Denies palpitations or leg swelling.  Gastrointestinal: Denies nausea, vomiting, abdominal pain, diarrhea, constipation Genitourinary: Denies dysuria, urgency, frequency Musculoskeletal: Denies myalgias, arthralgias, or gait problem.  Skin: + burn marks at mid chest from shocks.  Neurological: Denies dizziness, syncope, or weakness.    Psychiatric/Behavioral: +Anxiety and feeling jumpy. Denies confusion   Objective:  Physical Exam: Filed Vitals:   02/04/15 1315  BP: 134/72  Pulse: 58  Temp: 98 F (36.7 C)  TempSrc: Oral  Weight: 145 lb 4.8 oz (65.908 kg)  SpO2: 100%   Constitutional: Vital signs reviewed.  Patient is a well-developed and well-nourished male in no acute distress and cooperative with exam. Alert and oriented x3.  Head: Normocephalic and atraumatic Ear: Left TM normal, unable to visualize R due to cerumen in canal. Mouth: MMM.  Eyes: PERRL, EOMI. No scleral icterus.  Neck: Supple, Trachea midline, normal ROM, No JVD, mass, or thyromegaly present.  Cardiovascular: RRR, no MRG, pulses symmetric and intact bilaterally Pulmonary/Chest: Normal respiratory effort, CTAB, no wheezes, rales, or rhonchi Abdominal: Soft. Non-tender, non-distended, bowel sounds are normal, no masses, organomegaly, or guarding present.  Musculoskeletal: No joint deformities, erythema, or stiffness. ROM ful. Hematology: No cervical adenopathy.  Neurological: A&O x3, Strength is normal and symmetric bilaterally, cranial nerve II-XII are grossly intact, no focal deficits.  Skin: Warm and dry. Healing burn marks from defibrillator. Bandage at left axilla from ICD placement c/d/i.  Psychiatric: Normal affect. Mildly anxious. Speech is normal.   Assessment & Plan:   Please refer to Problem List based Assessment and Plan

## 2015-02-04 NOTE — Progress Notes (Signed)
Internal Medicine Clinic Attending  Case discussed with Dr. Glenn at the time of the visit.  We reviewed the resident's history and exam and pertinent patient test results.  I agree with the assessment, diagnosis, and plan of care documented in the resident's note.  

## 2015-02-04 NOTE — Assessment & Plan Note (Signed)
BP Readings from Last 3 Encounters:  02/04/15 134/72  01/30/15 144/84  01/26/15 148/89    Lab Results  Component Value Date   NA 139 01/29/2015   K 3.8 01/29/2015   CREATININE 0.74 01/29/2015    Assessment: Blood pressure control: controlled Progress toward BP goal:  at goal Comments: Pt taking carvedilol 3.125mg  BID and lisinopril 5mg  daily  Plan: Medications:  continue current medications Educational resources provided:   Self management tools provided:   Other plans: F/u in 3 mo

## 2015-02-04 NOTE — Patient Instructions (Addendum)
General Instructions: Be sure to avoid strenuous activity until cleared by your Cardiologist.   Continue to avoid smoking cigarettes   Be sure to go to your Cardiology appointment on Monday  Return to the clinic in 3 months for a follow up appointment.     Thank you for bringing your medicines today. This helps [place] keep you safe from mistakes.   Progress Toward Treatment Goals:  Treatment Goal 02/20/2013  Stop smoking smoking the same amount    Self Care Goals & Plans:  Self Care Goal 02/20/2013  Manage my medications take my medicines as prescribed; bring my medications to every visit; refill my medications on time; follow the sick day instructions if I am sick  Monitor my health keep track of my blood pressure; keep track of my weight  Eat healthy foods eat more vegetables; eat fruit for snacks and desserts; eat smaller portions  Be physically active find an activity I enjoy; take a walk every day    No flowsheet data found.   Care Management & Community Referrals:  No flowsheet data found.

## 2015-02-05 LAB — BASIC METABOLIC PANEL WITH GFR
BUN: 13 mg/dL (ref 6–23)
CALCIUM: 9.1 mg/dL (ref 8.4–10.5)
CO2: 24 mEq/L (ref 19–32)
CREATININE: 0.78 mg/dL (ref 0.50–1.35)
Chloride: 101 mEq/L (ref 96–112)
GLUCOSE: 87 mg/dL (ref 70–99)
POTASSIUM: 4.8 meq/L (ref 3.5–5.3)
Sodium: 138 mEq/L (ref 135–145)

## 2015-02-05 LAB — CBC
HEMATOCRIT: 38.7 % — AB (ref 39.0–52.0)
Hemoglobin: 12.6 g/dL — ABNORMAL LOW (ref 13.0–17.0)
MCH: 27.8 pg (ref 26.0–34.0)
MCHC: 32.6 g/dL (ref 30.0–36.0)
MCV: 85.2 fL (ref 78.0–100.0)
MPV: 9.1 fL (ref 8.6–12.4)
PLATELETS: 522 10*3/uL — AB (ref 150–400)
RBC: 4.54 MIL/uL (ref 4.22–5.81)
RDW: 14.4 % (ref 11.5–15.5)
WBC: 10.5 10*3/uL (ref 4.0–10.5)

## 2015-02-07 ENCOUNTER — Encounter: Payer: Self-pay | Admitting: *Deleted

## 2015-02-08 ENCOUNTER — Ambulatory Visit (INDEPENDENT_AMBULATORY_CARE_PROVIDER_SITE_OTHER): Payer: No Typology Code available for payment source | Admitting: *Deleted

## 2015-02-08 DIAGNOSIS — I255 Ischemic cardiomyopathy: Secondary | ICD-10-CM

## 2015-02-08 DIAGNOSIS — Z4502 Encounter for adjustment and management of automatic implantable cardiac defibrillator: Secondary | ICD-10-CM

## 2015-02-08 DIAGNOSIS — I469 Cardiac arrest, cause unspecified: Secondary | ICD-10-CM

## 2015-02-08 LAB — CUP PACEART INCLINIC DEVICE CHECK
Battery Remaining Percentage: 100 %
Date Time Interrogation Session: 20160613165823
MDC IDC SET ZONE DETECTION INTERVAL: 250 ms
Pulse Gen Serial Number: 115885
Zone Setting Detection Interval: 272.73 ms

## 2015-02-08 NOTE — Progress Notes (Signed)
Subcutaneous ICD wound check in clinic. Tegaderms removed. Wound without redness. Mild edema present---pt knows to call clinic if swelling increases. Pt's family member states current state of edema same as discharge. Incision edges approximated, wound well healed. 0 untreated episodes; 0 treated episodes; 0 shocks delivered. Electrode impedance status okay. No programming changes. Remaining longevity to ERI 100%. ROV w/ SK 05/11/15.

## 2015-02-09 ENCOUNTER — Other Ambulatory Visit: Payer: Self-pay

## 2015-02-09 ENCOUNTER — Encounter: Payer: Self-pay | Admitting: Internal Medicine

## 2015-02-09 MED ORDER — NITROGLYCERIN 0.4 MG SL SUBL: 0.4000 mg | SUBLINGUAL_TABLET | SUBLINGUAL | Status: DC | PRN

## 2015-02-15 ENCOUNTER — Telehealth: Payer: Self-pay | Admitting: Internal Medicine

## 2015-02-15 NOTE — Telephone Encounter (Signed)
New problem    Pt's wife want to speak to nurse concerning genetic testing that the doctor talked to pt about. Please call

## 2015-02-15 NOTE — Telephone Encounter (Signed)
Confirmed with patient ok to speak with his wife about this. Wife asking for more information about genetic testing, along with some questions. Advised that someone would call them to discuss this.  She is agreeable.

## 2015-02-19 ENCOUNTER — Other Ambulatory Visit: Payer: Self-pay | Admitting: *Deleted

## 2015-02-19 MED ORDER — OXYCODONE-ACETAMINOPHEN 5-325 MG PO TABS: 1.0000 | ORAL_TABLET | Freq: Four times a day (QID) | ORAL | Status: DC | PRN

## 2015-02-19 NOTE — Telephone Encounter (Signed)
Pt received Oxycodone on 6/9 # 78.  He is taking every 6 - 7 hours as written.  He tried IBU in between with little relief.  He is asking for refill before the weekend. He has about 7 left from the Rx.  Can you help with this?

## 2015-02-22 ENCOUNTER — Ambulatory Visit (INDEPENDENT_AMBULATORY_CARE_PROVIDER_SITE_OTHER): Payer: Self-pay | Admitting: Internal Medicine

## 2015-02-22 ENCOUNTER — Encounter: Payer: Self-pay | Admitting: Internal Medicine

## 2015-02-22 VITALS — BP 119/67 | HR 56 | Temp 97.6°F | Ht 67.0 in | Wt 149.8 lb

## 2015-02-22 DIAGNOSIS — R0789 Other chest pain: Secondary | ICD-10-CM

## 2015-02-22 MED ORDER — OXYCODONE-ACETAMINOPHEN 5-325 MG PO TABS: 1.0000 | ORAL_TABLET | Freq: Four times a day (QID) | ORAL | Status: DC | PRN

## 2015-02-22 NOTE — Assessment & Plan Note (Signed)
-    His MSK pain is likely due to the prolonged compressions.  His CXR in the hospital was unremarkable and I do not suspect a severe fracture.  I advised that this will likely get better with time.   - He will continue Ibuprofen OTC. - He was given 20 pills of Percocet on Friday, will write for 20 more with fill date on July 3rd.  I hope this will be able to last him till his next appointment in 1 month.  If chest pain does not continue to improve will consider additional imaging.

## 2015-02-22 NOTE — Patient Instructions (Signed)
General Instructions:   Please bring your medicines with you each time you come to clinic.  Medicines may include prescription medications, over-the-counter medications, herbal remedies, eye drops, vitamins, or other pills.   Progress Toward Treatment Goals:  Treatment Goal 02/04/2015  Blood pressure at goal  Stop smoking stopped smoking    Self Care Goals & Plans:  Self Care Goal 02/20/2013  Manage my medications take my medicines as prescribed; bring my medications to every visit; refill my medications on time; follow the sick day instructions if I am sick  Monitor my health keep track of my blood pressure; keep track of my weight  Eat healthy foods eat more vegetables; eat fruit for snacks and desserts; eat smaller portions  Be physically active find an activity I enjoy; take a walk every day    No flowsheet data found.   Care Management & Community Referrals:  No flowsheet data found.

## 2015-02-22 NOTE — Progress Notes (Signed)
[facility] Subjective:   Patient ID: [name] [name] male   DOB: 23-Oct-1960 54 y.o.   MRN: [id number]  HPI: [name] [name] is a 54 y.o. male with a PMH detailed below who presents for acute visit for chest pain.  About 1 month ago he had a cardiac arrest and underwent about 45 minutes of CPR. He then underwent the hypothermia protocol and had execellent recovery.  He was found to have a reduced EF of 35% by echo and but had normal coronaries by cath.  And ICD was placed and he has been doing well other than chest pain.  He reports that since the event he has had chest pain, it is slowly getting better but he has been using about 3 pills of 5/325 percocet a day.  Recently he has also added OTC Ibuprofen.  He has been trying to take things easy until he sees his cardiologist.     Past Medical History  Diagnosis Date  . Cardiac arrest 12/2014  . PULMONARY NODULE 12/21/2009    Stable 46mm per repeat CXR 4/11  . PTSD (post-traumatic stress disorder)     Found deceased brother- resolved per pt  . Tobacco abuse     1 ppd x 16 ys. Quit 04/22/12  . IV drug abuse     Quit. Used in the 42s  . Depression     Resolved- pt contributes to previous drug use  . Anemia   . Substance abuse   . Hemorrhoids   . Hepatitis C     S/p completing treatment at [facility]   Current Outpatient Prescriptions  Medication Sig Dispense Refill  . carvedilol (COREG) 3.125 MG tablet Take 1 tablet (3.125 mg total) by mouth 2 (two) times daily with a meal. 60 tablet 1  . lisinopril (PRINIVIL,ZESTRIL) 5 MG tablet Take 1 tablet (5 mg total) by mouth daily. 30 tablet 1  . Multiple Vitamin (MULTIVITAMIN WITH MINERALS) TABS tablet Take 1 tablet by mouth daily.    . nitroGLYCERIN (NITROSTAT) 0.4 MG SL tablet Place 1 tablet (0.4 mg total) under the tongue every 5 (five) minutes as needed for chest pain. 25 tablet 5  . oxyCODONE-acetaminophen (PERCOCET) 5-325 MG per tablet Take 1 tablet by mouth every 6  (six) hours as needed. 20 tablet 0   No current facility-administered medications for this visit.   Family History  Problem Relation Age of Onset  . Hyperlipidemia Mother   . Hypertension Mother   . COPD Mother   . Colon polyps Mother 32  . Heart disease Father     Decease from MI at 68yo  . Cancer Maternal Grandfather     Colon cancer, deceased  . Alcohol abuse Maternal Grandfather   . Colon cancer Maternal Grandfather 38  . Esophageal cancer Neg Hx   . Rectal cancer Neg Hx    History   Social History  . Marital Status: Married    Spouse Name: N/A  . Number of Children: N/A  . Years of Education: N/A   Social History Main Topics  . Smoking status: Current Every Day Smoker -- 0.50 packs/day for 29 years    Types: Cigarettes  . Smokeless tobacco: Never Used     Comment: Stopped x 3-4 days  . Alcohol Use: 3.6 oz/week    6 Cans of beer per week     Comment: occassional  . Drug Use: Yes    Special: "Crack" cocaine, Marijuana, Heroin  Comment: Quit IV drugs in the '80s. Last crack use 2009 or 2010.  Marland Kitchen Sexual Activity:    Partners: Female   Other Topics Concern  . None   Social History Narrative   ** Merged History Encounter **       Review of Systems: Review of Systems  Constitutional: Negative for fever and malaise/fatigue.  Eyes: Negative for blurred vision.  Respiratory: Negative for cough and shortness of breath.   Cardiovascular: Positive for chest pain. Negative for leg swelling.  Gastrointestinal: Negative for heartburn and abdominal pain.  Genitourinary: Negative for dysuria.     Objective:  Physical Exam: Filed Vitals:   02/22/15 0942  BP: 119/67  Pulse: 56  Temp: 97.6 F (36.4 C)  TempSrc: Oral  Height: 5\' 7"  (1.702 m)  Weight: 149 lb 12.8 oz (67.949 kg)  SpO2: 100%   Physical Exam  Constitutional: He is well-developed, well-nourished, and in no distress.  HENT:  Head: Normocephalic and atraumatic.  Cardiovascular: Normal rate and  regular rhythm.   Pulmonary/Chest: Effort normal and breath sounds normal. He exhibits tenderness (lower left sternal boarder).  Abdominal: Soft. Bowel sounds are normal.  Skin: Skin is warm and dry.  Nursing note and vitals reviewed.    Assessment & Plan:  Case discussed with Dr. Daryll Drown  Chest pain, musculoskeletal -  His MSK pain is likely due to the prolonged compressions.  His CXR in the hospital was unremarkable and I do not suspect a severe fracture.  I advised that this will likely get better with time.   - He will continue Ibuprofen OTC. - He was given 20 pills of Percocet on Friday, will write for 20 more with fill date on July 3rd.  I hope this will be able to last him till his next appointment in 1 month.  If chest pain does not continue to improve will consider additional imaging.     Medications Ordered Meds ordered this encounter  Medications  . oxyCODONE-acetaminophen (PERCOCET) 5-325 MG per tablet    Sig: Take 1 tablet by mouth every 6 (six) hours as needed.    Dispense:  20 tablet    Refill:  0    May fill on 02/28/15   Other Orders No orders of the defined types were placed in this encounter.   Follow Up: Return in about 1 month (around 03/24/2015).

## 2015-02-23 NOTE — Telephone Encounter (Signed)
Informed wife that I reviewed with Dr. Caryl Comes who recommends - sudden death panel testing.  Explained that I would be looking into this and contacting them to arrange blood work/testing. They are asking about the needs for other family members needing to be testing and aware I will look into this also.

## 2015-02-24 ENCOUNTER — Encounter: Payer: Self-pay | Admitting: Internal Medicine

## 2015-02-26 NOTE — Progress Notes (Signed)
Internal Medicine Clinic Attending  Case discussed with Dr. Hoffman soon after the resident saw the patient.  We reviewed the resident's history and exam and pertinent patient test results.  I agree with the assessment, diagnosis, and plan of care documented in the resident's note. 

## 2015-03-08 ENCOUNTER — Other Ambulatory Visit: Payer: Self-pay | Admitting: *Deleted

## 2015-03-08 MED ORDER — OXYCODONE-ACETAMINOPHEN 5-325 MG PO TABS: 1.0000 | ORAL_TABLET | Freq: Four times a day (QID) | ORAL | Status: DC | PRN

## 2015-03-08 NOTE — Telephone Encounter (Signed)
Dr Heber Zillah saw 6/27. Was taking 3 pills / day. Gave 20 so would have run out about the 10th. Must be taking fewer though bc has 5 left. Pain 2/2 chest compression after cardiac arrest. Therefore, will print off 20 more. Has appt PCP but not until Aug. If he requires additional Rx prior to PCP appt, he might be seen - will depend on whether freq of pill usage is the same or declining.

## 2015-03-08 NOTE — Telephone Encounter (Signed)
Pt's spouse states he has appr 5 tablets left but continues to have pain

## 2015-03-09 NOTE — Telephone Encounter (Signed)
Notified wife.

## 2015-03-17 ENCOUNTER — Telehealth: Payer: Self-pay | Admitting: Internal Medicine

## 2015-03-17 NOTE — Telephone Encounter (Signed)
New Message  Pt wife called states that there is a white spot on the scar where the patient had surgery. Pt request a call back to determine if this is normal.

## 2015-03-18 NOTE — Telephone Encounter (Signed)
Emailed [facility] today about possibly arranging testing.

## 2015-03-18 NOTE — Telephone Encounter (Signed)
Spouse states spot was present yesterday but is not there today. No swelling. No warmth to touch. No bleeding or other fluids. Spouse states pt was swimming on day spot appeared.  Appt made w/ device clinic tomorrow to review incision (and spot if present).

## 2015-03-19 ENCOUNTER — Ambulatory Visit: Payer: No Typology Code available for payment source | Admitting: *Deleted

## 2015-03-19 DIAGNOSIS — I469 Cardiac arrest, cause unspecified: Secondary | ICD-10-CM

## 2015-03-19 DIAGNOSIS — I255 Ischemic cardiomyopathy: Secondary | ICD-10-CM

## 2015-03-19 NOTE — Progress Notes (Signed)
Patient seen in clinic for "spot" on his SICD sternal incision. Site assessed-small stitch noted at mid incision. Area was not red, swollen, or hot to touch. Site was also assessed by Dr.Taylor. Dr.Taylor stated that the stitch should dissolve over time. He advised pt to wash the area with Dial soap BID for 1 week. Patient encouraged to call if he notices that the site starts to become red, hot, swollen, or starts to drain copious amounts of fluids. Patient voiced understanding.

## 2015-03-24 ENCOUNTER — Ambulatory Visit (INDEPENDENT_AMBULATORY_CARE_PROVIDER_SITE_OTHER): Payer: Self-pay | Admitting: Internal Medicine

## 2015-03-24 ENCOUNTER — Encounter: Payer: Self-pay | Admitting: Internal Medicine

## 2015-03-24 VITALS — BP 139/81 | HR 54 | Temp 97.6°F | Ht 67.0 in | Wt 150.8 lb

## 2015-03-24 DIAGNOSIS — R0789 Other chest pain: Secondary | ICD-10-CM

## 2015-03-24 DIAGNOSIS — I10 Essential (primary) hypertension: Secondary | ICD-10-CM

## 2015-03-24 MED ORDER — CARVEDILOL 3.125 MG PO TABS: 3.1250 mg | ORAL_TABLET | Freq: Two times a day (BID) | ORAL | Status: DC

## 2015-03-24 MED ORDER — LISINOPRIL 5 MG PO TABS: 5.0000 mg | ORAL_TABLET | Freq: Every day | ORAL | Status: DC

## 2015-03-24 MED ORDER — OXYCODONE-ACETAMINOPHEN 5-325 MG PO TABS: 1.0000 | ORAL_TABLET | Freq: Two times a day (BID) | ORAL | Status: DC | PRN

## 2015-03-24 NOTE — Progress Notes (Signed)
Internal Medicine Clinic Attending  I saw and evaluated the patient.  I personally confirmed the key portions of the history and exam documented by Dr. Saraiya and I reviewed pertinent patient test results.  The assessment, diagnosis, and plan were formulated together and I agree with the documentation in the resident's note.  

## 2015-03-24 NOTE — Assessment & Plan Note (Signed)
Sternal pain has improved over the last month. No red flags or atypical symptoms suggestive of cardiac etiology Surgical scar healing well  Refilled percoset- for 1 month- given 30 pills

## 2015-03-24 NOTE — Progress Notes (Signed)
Patient ID: [name] [name], male   DOB: 04/05/1961, 54 y.o.   MRN: [id number]    Subjective:   Patient ID: [name] [name] male   DOB: Nov 27, 1960 54 y.o.   MRN: [id number]  HPI: [name] [name] is a 54 y.o. man who is here for a follow up visit He was admitted on 5/26 with VFib, underwent CPR for about 45 minutes with ROSC, then was in ICU with hypothermia protocol. LHC showed no CAD and TTE showed EF about 35%, normal lipid and TSH panels  Underwent ICD placement on 6/3, and he follows up with Cardiology.  He is here for BP check, and refill of medicines. Endorses continued pain on the sternal area especially with moving, likely due to the muscle damage with prolonged CPR, but he says this has improved over the past month in that he only needs 1 percoset per day and he alternated with ibuprofen. He has not had to use his nitroglycerin. Is planning to do genetic testing per cardiology     Past Medical History  Diagnosis Date  . Cardiac arrest 12/2014  . PULMONARY NODULE 12/21/2009    Stable 57mm per repeat CXR 4/11  . PTSD (post-traumatic stress disorder)     Found deceased brother- resolved per pt  . Tobacco abuse     1 ppd x 86 ys. Quit 04/22/12  . IV drug abuse     Quit. Used in the 57s  . Depression     Resolved- pt contributes to previous drug use  . Anemia   . Substance abuse   . Hemorrhoids   . Hepatitis C     S/p completing treatment at [facility]   Current Outpatient Prescriptions  Medication Sig Dispense Refill  . carvedilol (COREG) 3.125 MG tablet Take 1 tablet (3.125 mg total) by mouth 2 (two) times daily with a meal. 60 tablet 1  . lisinopril (PRINIVIL,ZESTRIL) 5 MG tablet Take 1 tablet (5 mg total) by mouth daily. 30 tablet 1  . Multiple Vitamin (MULTIVITAMIN WITH MINERALS) TABS tablet Take 1 tablet by mouth daily.    . nitroGLYCERIN (NITROSTAT) 0.4 MG SL tablet Place 1 tablet (0.4 mg total) under the tongue every 5 (five) minutes as needed for chest pain. 25 tablet 5    . oxyCODONE-acetaminophen (PERCOCET) 5-325 MG per tablet Take 1 tablet by mouth every 12 (twelve) hours as needed. 30 tablet 0   No current facility-administered medications for this visit.   Family History  Problem Relation Age of Onset  . Hyperlipidemia Mother   . Hypertension Mother   . COPD Mother   . Colon polyps Mother 62  . Heart disease Father     Decease from MI at 21yo  . Cancer Maternal Grandfather     Colon cancer, deceased  . Alcohol abuse Maternal Grandfather   . Colon cancer Maternal Grandfather 27  . Esophageal cancer Neg Hx   . Rectal cancer Neg Hx    History   Social History  . Marital Status: Married    Spouse Name: N/A  . Number of Children: N/A  . Years of Education: N/A   Social History Main Topics  . Smoking status: Current Every Day Smoker -- 0.50 packs/day for 29 years    Types: Cigarettes  . Smokeless tobacco: Never Used     Comment: Stopped x 3-4 days  . Alcohol Use: 3.6 oz/week    6 Cans of beer per week     Comment: occassional  .  Drug Use: Yes    Special: "Crack" cocaine, Marijuana, Heroin     Comment: Quit IV drugs in the '80s. Last crack use 2009 or 2010.  Marland Kitchen Sexual Activity:    Partners: Female   Other Topics Concern  . Not on file   Social History Narrative   ** Merged History Encounter **       Review of Systems: Review of Systems  Constitutional: Negative.   HENT: Negative.   Respiratory: Negative.  Negative for cough, shortness of breath and wheezing.   Cardiovascular: Negative.  Negative for chest pain, palpitations, orthopnea, claudication, leg swelling and PND.  Gastrointestinal: Negative for heartburn, nausea, vomiting, abdominal pain and diarrhea.  Musculoskeletal: Negative.   Neurological: Negative.   All other systems reviewed and are negative.   Objective:  Physical Exam: Filed Vitals:   03/24/15 0831  BP: 139/81  Pulse: 54  Temp: 97.6 F (36.4 C)  TempSrc: Oral  Height: 5\' 7"  (1.702 m)  Weight: 150 lb  12.8 oz (68.402 kg)  SpO2: 100%   Physical Exam  Constitutional: He appears well-developed and well-nourished.  Eyes: EOM are normal.  Neck: Normal range of motion. Neck supple.  Cardiovascular: Normal rate and intact distal pulses.   No murmur heard. Split S1, normal s2 Well healed surgical scars on the left chest, some ttp over the mid sternal area  Pulmonary/Chest: Effort normal and breath sounds normal. No respiratory distress. He has no wheezes. He exhibits tenderness.  Abdominal: Soft. He exhibits no distension.  Musculoskeletal: Normal range of motion.  Skin: Skin is warm. No erythema.  Psychiatric: He has a normal mood and affect.    Assessment & Plan:

## 2015-03-24 NOTE — Assessment & Plan Note (Signed)
BP Readings from Last 3 Encounters:  03/24/15 139/81  02/22/15 119/67  02/04/15 134/72   Well controlled, continue current regimen of coreg 3.125 and lisinopril 5 mg

## 2015-03-24 NOTE — Patient Instructions (Signed)
Thank you for your visit today.  Your blood pressure is well controlled on your current medicines !  Please continue to follow up with cardiology  Please follow up in about 3 months  General Instructions:   Please bring your medicines with you each time you come to clinic.  Medicines may include prescription medications, over-the-counter medications, herbal remedies, eye drops, vitamins, or other pills.   Progress Toward Treatment Goals:  Treatment Goal 02/04/2015  Blood pressure at goal  Stop smoking stopped smoking    Self Care Goals & Plans:  Self Care Goal 02/20/2013  Manage my medications take my medicines as prescribed; bring my medications to every visit; refill my medications on time; follow the sick day instructions if I am sick  Monitor my health keep track of my blood pressure; keep track of my weight  Eat healthy foods eat more vegetables; eat fruit for snacks and desserts; eat smaller portions  Be physically active find an activity I enjoy; take a walk every day    No flowsheet data found.   Care Management & Community Referrals:  No flowsheet data found.

## 2015-04-05 NOTE — Progress Notes (Signed)
Internal Medicine Clinic Attending  I saw and evaluated the patient.  I personally confirmed the key portions of the history and exam documented by Dr. Saraiya and I reviewed pertinent patient test results.  The assessment, diagnosis, and plan were formulated together and I agree with the documentation in the resident's note.  

## 2015-04-09 ENCOUNTER — Encounter: Payer: Self-pay | Admitting: Internal Medicine

## 2015-04-16 ENCOUNTER — Encounter: Payer: Self-pay | Admitting: Internal Medicine

## 2015-04-16 NOTE — Telephone Encounter (Signed)
Left message on wife's cell phone informing them that Nira Conn, RN will contact them next week on genetic testing.

## 2015-04-21 ENCOUNTER — Telehealth: Payer: Self-pay | Admitting: Internal Medicine

## 2015-04-21 NOTE — Telephone Encounter (Signed)
Sherri left a message for me to follow up with this patient regarding genetic testing. I have left the patient a message to call.

## 2015-04-29 NOTE — Telephone Encounter (Signed)
I attempted to call the patient's home #- this has been disconnected/ changed per message. I called the patient's wife's cell # and spoke with her. I advised I will forward the patient's information to Dr. Broadus John at [facility] and then we will know what to do as far as his insurance is concerned. The patient's wife is agreeable with this.

## 2015-05-11 ENCOUNTER — Ambulatory Visit (INDEPENDENT_AMBULATORY_CARE_PROVIDER_SITE_OTHER): Payer: No Typology Code available for payment source | Admitting: Internal Medicine

## 2015-05-11 ENCOUNTER — Encounter: Payer: Self-pay | Admitting: Internal Medicine

## 2015-05-11 VITALS — BP 150/90 | HR 48 | Ht 70.0 in | Wt 149.0 lb

## 2015-05-11 DIAGNOSIS — I469 Cardiac arrest, cause unspecified: Secondary | ICD-10-CM

## 2015-05-11 DIAGNOSIS — I255 Ischemic cardiomyopathy: Secondary | ICD-10-CM

## 2015-05-11 DIAGNOSIS — I429 Cardiomyopathy, unspecified: Secondary | ICD-10-CM

## 2015-05-11 DIAGNOSIS — I428 Other cardiomyopathies: Secondary | ICD-10-CM

## 2015-05-11 LAB — CUP PACEART INCLINIC DEVICE CHECK
MDC IDC SESS DTM: 20160913102818
Pulse Gen Serial Number: 115885
Zone Setting Detection Interval: 250 ms
Zone Setting Detection Interval: 272.73 ms

## 2015-05-11 MED ORDER — LISINOPRIL 10 MG PO TABS: 10.0000 mg | ORAL_TABLET | Freq: Every day | ORAL | Status: DC

## 2015-05-11 NOTE — Patient Instructions (Addendum)
Medication Instructions: 1) Stop coreg (carvedilol) 2) Increase lisinopril to 10 mg one tablet by mouth once daily (you may take two of the 5 mg tablets once daily to use them up)  Labwork: - none  Procedures/Testing: - none  Follow-Up: - Remote monitoring is used to monitor your Pacemaker of ICD from home. This monitoring reduces the number of office visits required to check your device to one time per year. It allows [place] to keep an eye on the functioning of your device to ensure it is working properly. You are scheduled for a device check from home on 08/10/15. You may send your transmission at any time that day. If you have a wireless device, the transmission will be sent automatically. After your physician reviews your transmission, you will receive a postcard with your next transmission date.  - Your physician wants you to follow-up in: 9 months (June 2017) with Dr. Gari Crown will receive a reminder letter in the mail two months in advance. If you don't receive a letter, please call our office to schedule the follow-up appointment.  Any Additional Special Instructions Will Be Listed Below (If Applicable).

## 2015-05-11 NOTE — Progress Notes (Signed)
Patient Care Team: Iline Oven, MD as PCP - General   HPI  [name] [name] is a 54 y.o. 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 Ejection fraction initially was 35% with subsequent normalization.  Brugada-flecainide challenge was negative.  Signal average was negative.  MRI was mildly abnormal in that there was no structural heart disease but a small degree of gadolinium enhancement in the inferior wall   Records and Results Reviewed  Past Medical History  Diagnosis Date  . Cardiac arrest 12/2014  . PULMONARY NODULE 12/21/2009    Stable 60mm per repeat CXR 4/11  . PTSD (post-traumatic stress disorder)     Found deceased brother- resolved per pt  . Tobacco abuse     1 ppd x 80 ys. Quit 04/22/12  . IV drug abuse     Quit. Used in the 36s  . Depression     Resolved- pt contributes to previous drug use  . Anemia   . Substance abuse   . Hemorrhoids   . Hepatitis C     S/p completing treatment at [facility]    Past Surgical History  Procedure Laterality Date  . Fracture surgery Left     In high school  . Cyst removal neck  2011  . Multiple tooth extractions  1999  . Wisdom tooth extraction    . Cardiac catheterization N/A 01/26/2015    Procedure: Left Heart Cath and Coronary Angiography;  Surgeon: Leonie Man, MD;  Location: [place] [facility];  Service: Cardiovascular;  Laterality: N/A;  . Ep implantable device N/A 01/29/2015    Procedure: SubQ ICD Implant;  Surgeon: Deboraha Sprang, MD;  Location: [place] [facility];  Service: Cardiovascular;  Laterality: N/A;    Current Outpatient Prescriptions  Medication Sig Dispense Refill  . carvedilol (COREG) 3.125 MG tablet Take 1 tablet (3.125 mg total) by mouth 2 (two) times daily with a meal. 60 tablet 1  . ibuprofen (ADVIL,MOTRIN) 200 MG tablet Take 400-600 mg by mouth every morning.     Marland Kitchen lisinopril (PRINIVIL,ZESTRIL) 5 MG  tablet Take 1 tablet (5 mg total) by mouth daily. 30 tablet 1  . Multiple Vitamin (MULTIVITAMIN WITH MINERALS) TABS tablet Take 1 tablet by mouth daily.    . nitroGLYCERIN (NITROSTAT) 0.4 MG SL tablet Place 1 tablet (0.4 mg total) under the tongue every 5 (five) minutes as needed for chest pain. 25 tablet 5  . oxyCODONE-acetaminophen (PERCOCET) 5-325 MG per tablet Take 1 tablet by mouth every 12 (twelve) hours as needed. (Patient taking differently: Take 1 tablet by mouth every 12 (twelve) hours as needed (pain). ) 30 tablet 0   No current facility-administered medications for this visit.    No Known Allergies    Review of Systems negative except from HPI and PMH  Physical Exam BP 150/90 mmHg  Pulse 48  Ht 5\' 10"  (1.778 m)  Wt 149 lb (67.586 kg)  BMI 21.38 kg/m2 Well developed and well nourished in no acute distress HENT normal E scleral and icterus clear Neck Supple JVP flat; carotids brisk and full Clear to ausculation  Regular rate and rhythm, no murmurs gallops or rub Soft with active bowel sounds No clubbing cyanosis  Edema Alert and oriented, grossly normal motor and sensory function Skin Warm and Dry  Sinus brad 48 13/09/41 occ PVC  Assessment and  Plan   aborted Cardiac Arrest  Hypertension   SICD  The patient's device was interrogated.  The information was reviewed. No changes were made in the programming.    Smoking  Encouraged to stop smoking Will increase lisinopril and stop carvedilol with resovled cardiomypathy

## 2015-06-01 ENCOUNTER — Encounter: Payer: Self-pay | Admitting: Internal Medicine

## 2015-06-14 ENCOUNTER — Other Ambulatory Visit: Payer: Self-pay | Admitting: Internal Medicine

## 2015-06-14 DIAGNOSIS — R0789 Other chest pain: Secondary | ICD-10-CM

## 2015-06-14 NOTE — Telephone Encounter (Signed)
Call pt -not @ home, talked to his wife. States he went to cardiologist and was told he can do more activities; which is causing more pain @ sternum area "where they did CPR". Last refill and OV was 7/27.  Next appt is 11/4.

## 2015-06-14 NOTE — Telephone Encounter (Signed)
Patient requesting a refill on his oxycodone.

## 2015-06-15 MED ORDER — OXYCODONE-ACETAMINOPHEN 5-325 MG PO TABS: 1.0000 | ORAL_TABLET | Freq: Two times a day (BID) | ORAL | Status: DC | PRN

## 2015-07-02 ENCOUNTER — Encounter: Payer: Self-pay | Admitting: Internal Medicine

## 2015-07-02 ENCOUNTER — Ambulatory Visit (INDEPENDENT_AMBULATORY_CARE_PROVIDER_SITE_OTHER): Payer: Self-pay | Admitting: Internal Medicine

## 2015-07-02 VITALS — BP 133/86 | HR 64 | Temp 98.0°F | Ht 70.0 in | Wt 150.7 lb

## 2015-07-02 DIAGNOSIS — F17210 Nicotine dependence, cigarettes, uncomplicated: Secondary | ICD-10-CM

## 2015-07-02 DIAGNOSIS — I10 Essential (primary) hypertension: Secondary | ICD-10-CM

## 2015-07-02 DIAGNOSIS — F17200 Nicotine dependence, unspecified, uncomplicated: Secondary | ICD-10-CM

## 2015-07-02 DIAGNOSIS — R0789 Other chest pain: Secondary | ICD-10-CM

## 2015-07-02 DIAGNOSIS — Z23 Encounter for immunization: Secondary | ICD-10-CM

## 2015-07-02 DIAGNOSIS — Z8674 Personal history of sudden cardiac arrest: Secondary | ICD-10-CM | POA: Insufficient documentation

## 2015-07-02 MED ORDER — NICOTINE POLACRILEX 2 MG MT GUM
2.0000 mg | CHEWING_GUM | OROMUCOSAL | Status: DC | PRN
Start: 2015-07-02 — End: 2016-02-15

## 2015-07-02 MED ORDER — OXYCODONE-ACETAMINOPHEN 5-325 MG PO TABS: 1.0000 | ORAL_TABLET | Freq: Two times a day (BID) | ORAL | Status: DC | PRN

## 2015-07-02 NOTE — Assessment & Plan Note (Addendum)
He continues to be 3/4 ppd smoker.  He plans to slowly wean himself off.  He has used nicotine patches in the past and did not enjoy it.  He is willing to try Nicorette gum to help.  A/P: - Nicorette gum

## 2015-07-02 NOTE — Progress Notes (Signed)
Patient ID: BARTOW ZYLSTRA, male   DOB: 03/11/1961, 54 y.o.   MRN: [id number]   Subjective:   Patient ID: JEIDEN DAUGHTRIDGE male   DOB: April 09, 1961 54 y.o.   MRN: [id number]  HPI: HUNG RHINESMITH is a 54 y.o. male with PMH as below, here for routine follow up chest pain.  Please see Problem-Based charting for the status of the patient's chronic medical issues.  ROS: No fevers, chills, congestion, runny nose, SOB, dysphagia, N/V, C/D, dysuria, melena, hematochezia, dizziness.   He endorses unchanged sternal chest pain.    Past Medical History  Diagnosis Date  . Cardiac arrest ([place]) 12/2014  . PULMONARY NODULE 12/21/2009    Stable 70mm per repeat CXR 4/11  . PTSD (post-traumatic stress disorder)     Found deceased brother- resolved per pt  . Tobacco abuse     1 ppd x 16 ys. Quit 04/22/12  . IV drug abuse     Quit. Used in the 40s  . Depression     Resolved- pt contributes to previous drug use  . Anemia   . Substance abuse   . Hemorrhoids   . Hepatitis C     S/p completing treatment at [facility]   Current Outpatient Prescriptions  Medication Sig Dispense Refill  . ibuprofen (ADVIL,MOTRIN) 200 MG tablet Take 400-600 mg by mouth every morning.     Marland Kitchen lisinopril (PRINIVIL,ZESTRIL) 10 MG tablet Take 1 tablet (10 mg total) by mouth daily. 90 tablet 3  . Multiple Vitamin (MULTIVITAMIN WITH MINERALS) TABS tablet Take 1 tablet by mouth daily.    . nitroGLYCERIN (NITROSTAT) 0.4 MG SL tablet Place 1 tablet (0.4 mg total) under the tongue every 5 (five) minutes as needed for chest pain. 25 tablet 5  . oxyCODONE-acetaminophen (PERCOCET) 5-325 MG tablet Take 1 tablet by mouth every 12 (twelve) hours as needed for moderate pain. 30 tablet 0   No current facility-administered medications for this visit.   Family History  Problem Relation Age of Onset  . Hyperlipidemia Mother   . Hypertension Mother   . COPD Mother   . Colon polyps Mother 101  . Heart disease Father     Decease from MI at 46yo  .  Cancer Maternal Grandfather     Colon cancer, deceased  . Alcohol abuse Maternal Grandfather   . Colon cancer Maternal Grandfather 71  . Esophageal cancer Neg Hx   . Rectal cancer Neg Hx    Social History   Social History  . Marital Status: Married    Spouse Name: N/A  . Number of Children: N/A  . Years of Education: N/A   Social History Main Topics  . Smoking status: Current Every Day Smoker -- 0.50 packs/day for 29 years    Types: Cigarettes  . Smokeless tobacco: Never Used     Comment: Stopped x 3-4 days  . Alcohol Use: 3.6 oz/week    6 Cans of beer per week     Comment: occassional  . Drug Use: Yes    Special: "Crack" cocaine, Marijuana, Heroin     Comment: Quit IV drugs in the '80s. Last crack use 2009 or 2010.  Marland Kitchen Sexual Activity:    Partners: Female   Other Topics Concern  . Not on file   Social History Narrative   ** Merged History Encounter **       Review of Systems: Cardiovascular: positive for exertional chest pressure/discomfort Objective:  Physical Exam: There were no vitals filed for this  visit. Physical Exam  Constitutional: He is oriented to person, place, and time and well-developed, well-nourished, and in no distress. No distress.  HENT:  Head: Normocephalic and atraumatic.  Eyes: EOM are normal.  Neck: Neck supple. No JVD present. No tracheal deviation present.  Cardiovascular: Normal rate, regular rhythm, normal heart sounds and intact distal pulses.   Pulmonary/Chest: Effort normal and breath sounds normal. No respiratory distress. He has no wheezes. He has no rales.  Abdominal: He exhibits no distension. There is no tenderness. There is no rebound and no guarding.  Musculoskeletal: He exhibits no edema.  Neurological: He is oriented to person, place, and time.  Skin: Skin is warm. No rash noted. He is not diaphoretic.     Assessment & Plan:   Patient and case were discussed with Dr. Evette Doffing.  Please refer to Problem Based charting for  further documentation.

## 2015-07-02 NOTE — Progress Notes (Signed)
Internal Medicine Clinic Attending  I saw and evaluated the patient.  I personally confirmed the key portions of the history and exam documented by Dr. Lovena Le and I reviewed pertinent patient test results.  The assessment, diagnosis, and plan were formulated together and I agree with the documentation in the resident's note.  I personally spent greater than 3 minutes counseling on the benefits of tobacco cessation and the options of nicotine replacement therapy.

## 2015-07-02 NOTE — Patient Instructions (Addendum)
1. Stop smoking.  Take Nicorrette gum as needed to help quit.  Chew gum for a few seconds, until it tingles, and hold it in your lip until the tingling stops.  Then chew and repeat.  Use gum instead of smoking a cigarette. 2. You can exercise as tolerated. 3. Take Tylenol instead of the ibuprofen once a day.  Nicotine chewing gum What is this medicine? NICOTINE ([place] teen) helps people stop smoking. This medicine replaces the nicotine found in cigarettes and helps to decrease withdrawal effects. It is most effective when used in combination with a stop-smoking program. This medicine may be used for other purposes; ask your health care provider or pharmacist if you have questions. What should I tell my health care provider before I take this medicine? They need to know if you have any of these conditions: -diabetes -heart disease, angina, irregular heartbeat or previous heart attack -high blood pressure -lung disease, including asthma -overactive thyroid -pheochromocytoma -seizures or history of seizures -stomach problems or ulcers -an unusual or allergic reaction to nicotine, other medicines, foods, dyes, or preservatives -pregnant or trying to get pregnant -breast-feeding How should I use this medicine? Chew but do not swallow the gum. Follow the directions that come with the chewing gum. Use exactly as directed. When you feel an urgent desire for a cigarette, chew one piece of gum slowly. Continue chewing until you taste the gum or feel a slight tingling in your mouth. Then, stop chewing and place the gum between your cheek and gum. Wait until the taste or tingling is almost gone then start chewing again. Continue chewing in this manner for about 30 minutes. Slow chewing helps reduce cravings and also helps reduce the chance for heartburn or other gastrointestinal side effects. Talk to your pediatrician regarding the use of this medicine in children. Special care may be  needed. Overdosage: If you think you have taken too much of this medicine contact a poison control center or emergency room at once. NOTE: This medicine is only for you. Do not share this medicine with others. What if I miss a dose? This does not apply. Only use the chewing gum when you have a strong desire to smoke. Do not use more than one piece of gum at a time. What may interact with this medicine? -medicines for asthma -medicines for blood pressure -medicines for mental depression This list may not describe all possible interactions. Give your health care provider a list of all the medicines, herbs, non-prescription drugs, or dietary supplements you use. Also tell them if you smoke, drink alcohol, or use illegal drugs. Some items may interact with your medicine. What should I watch for while using this medicine? Always carry the nicotine gum with you. Do not use more than 30 pieces of gum a day. Too much gum can increase the risk of an overdose. As the urge to smoke gets less, gradually reduce the number of pieces each day over a period of 2 to 3 months. When you are only using 1 or 2 pieces a day, stop using the nicotine gum. You should begin using the nicotine gum the day you stop smoking. It is okay if you do not succeed with the attempt to quit and have a cigarette. You can still continue your quit attempt and keep using the product as directed. Just throw away your cigarettes and get back to your quit plan. If your mouth gets sore from chewing the gum, suck hard sugarless candy between  pieces of gum to help relieve the soreness. Brush your teeth regularly to reduce mouth irritation. If you wear dentures, contact your doctor or health care professional if the gum sticks to your dental work. If you are a diabetic and you quit smoking, the effects of insulin may be increased and you may need to reduce your insulin dose. Check with your doctor or health care professional about how you should  adjust your insulin dose. What side effects may I notice from receiving this medicine? Side effects that you should report to your doctor or health care professional as soon as possible: -allergic reactions like skin rash, itching or hives, swelling of the face, lips, or tongue -blisters in mouth -breathing problems -changes in hearing -changes in vision -chest pain -cold sweats -confusion -fast, irregular heartbeat -feeling faint or lightheaded, falls -headache -increased saliva -nausea, vomiting -stomach pain -weakness Side effects that usually do not require medical attention (report to your doctor or health care professional if they continue or are bothersome): -diarrhea -dry mouth -hiccups -irritability -nervousness or restlessness -trouble sleeping or vivid dreams This list may not describe all possible side effects. Call your doctor for medical advice about side effects. You may report side effects to FDA at 1-800-FDA-1088. Where should I keep my medicine? Keep out of the reach of children. Store at room temperature between 15 and 30 degrees C (59 and 86 degrees F). Protect from heat and light. Throw away unused medicine after the expiration date. NOTE: This sheet is a summary. It may not cover all possible information. If you have questions about this medicine, talk to your doctor, pharmacist, or health care provider.    2016, Elsevier/Gold Standard. (2015-02-08 19:37:14)   Chest Wall Pain Chest wall pain is pain in or around the bones and muscles of your chest. Sometimes, an injury causes this pain. Sometimes, the cause may not be known. This pain may take several weeks or longer to get better. HOME CARE INSTRUCTIONS  Pay attention to any changes in your symptoms. Take these actions to help with your pain:   Rest as told by your health care provider.   Avoid activities that cause pain. These include any activities that use your chest muscles or your abdominal and  side muscles to lift heavy items.   If directed, apply ice to the painful area:  Put ice in a plastic bag.  Place a towel between your skin and the bag.  Leave the ice on for 20 minutes, 2-3 times per day.  Take over-the-counter and prescription medicines only as told by your health care provider.  Do not use tobacco products, including cigarettes, chewing tobacco, and e-cigarettes. If you need help quitting, ask your health care provider.  Keep all follow-up visits as told by your health care provider. This is important. SEEK MEDICAL CARE IF:  You have a fever.  Your chest pain becomes worse.  You have new symptoms. SEEK IMMEDIATE MEDICAL CARE IF:  You have nausea or vomiting.  You feel sweaty or light-headed.  You have a cough with phlegm (sputum) or you cough up blood.  You develop shortness of breath.   This information is not intended to replace advice given to you by your health care provider. Make sure you discuss any questions you have with your health care provider.   Document Released: 08/14/2005 Document Revised: 05/05/2015 Document Reviewed: 11/09/2014 Elsevier Interactive Patient Education [employer].

## 2015-07-02 NOTE — Assessment & Plan Note (Signed)
BP Readings from Last 3 Encounters:  07/02/15 133/86  05/11/15 150/90  03/24/15 139/81    Lab Results  Component Value Date   NA 138 02/04/2015   K 4.8 02/04/2015   CREATININE 0.78 02/04/2015    Assessment: Blood pressure control:  good Progress toward BP goal:   stable Comments: No dizziness.  Tolerating meds well.  Plan: Medications:  continue current medications Educational resources provided:   Self management tools provided:   Other plans: stop smoking

## 2015-07-02 NOTE — Assessment & Plan Note (Signed)
Patient reports continued lower sternal pain at rest and with exertion 2/2 chest compressions from his cardiac arrest. He is only using 1 Percocet a day, and then using 400-600 mg Ibuprofen twice daily.  He is slowly increasing his physical exertion, which will also increase his discomfort.    A/P:  MSK Chest Pain: Patient counseled he can increase his exertion as tolerated. His cardiac cath was negative. His EF is near normal.  He may experience increased chest discomfort as his ribs get used to moving again, but he can increase activity as tolerated.  His Ibuprofen dose may be a little too much given his cardiac history.  He was advised to substitute one dose of Ibuprofen with one dose of tylenol.  He has a h/o HCV s/p IFNa and Ribavirin with cure.  He has no h/o cirrhosis. - Percocet 5/325 BID PRN pain

## 2015-07-12 NOTE — Telephone Encounter (Signed)
Call received back from Dr. Broadus John at [facility] - she has made multiple attempts to contact the patient regarding genetic testing, but no response back from the patient. She will close his file.

## 2015-07-26 ENCOUNTER — Telehealth: Payer: Self-pay | Admitting: Internal Medicine

## 2015-07-26 NOTE — Telephone Encounter (Signed)
New Message     Pt's wife calling stating that the pt's monitor isn't working and that it just blinks orange and won't let a signal go through. Please call back.

## 2015-07-26 NOTE — Telephone Encounter (Signed)
Informed pt wife that pt transmission was received. She verbalized understanding.

## 2015-07-28 ENCOUNTER — Telehealth: Payer: Self-pay | Admitting: Internal Medicine

## 2015-07-28 NOTE — Telephone Encounter (Signed)
Advised pt to speak w/ pharmacist to review pt meds and suggest otc, if he continues to have cold symptoms and develops fever, becomes worse, call for appt, she is agreeable

## 2015-07-28 NOTE — Telephone Encounter (Addendum)
Pt's wife would like to know what kind of cold medicine could her husband take.  She does not want to give him something he should not have.

## 2015-08-10 ENCOUNTER — Ambulatory Visit (INDEPENDENT_AMBULATORY_CARE_PROVIDER_SITE_OTHER): Payer: No Typology Code available for payment source | Admitting: *Deleted

## 2015-08-10 DIAGNOSIS — I469 Cardiac arrest, cause unspecified: Secondary | ICD-10-CM

## 2015-08-10 NOTE — Progress Notes (Signed)
Remote ICD transmission.   

## 2015-08-14 LAB — CUP PACEART REMOTE DEVICE CHECK
Battery Remaining Percentage: 95 %
Implantable Lead Location: 753858
Implantable Lead Model: 3401
MDC IDC LEAD IMPLANT DT: 20160603
MDC IDC SESS DTM: 20161213162800
Pulse Gen Serial Number: 115885

## 2015-08-18 ENCOUNTER — Encounter: Payer: Self-pay | Admitting: Cardiology

## 2015-09-01 ENCOUNTER — Other Ambulatory Visit: Payer: Self-pay | Admitting: Internal Medicine

## 2015-09-01 DIAGNOSIS — R0789 Other chest pain: Secondary | ICD-10-CM

## 2015-09-01 NOTE — Telephone Encounter (Signed)
Patient's wife walked in today requesting refill on percocet.  Please call patient when ready.

## 2015-09-01 NOTE — Telephone Encounter (Signed)
Last refill 11/18 Last OV 7/27 UDS 5/26

## 2015-09-02 ENCOUNTER — Encounter: Payer: Self-pay | Admitting: Cardiology

## 2015-09-03 MED ORDER — OXYCODONE-ACETAMINOPHEN 5-325 MG PO TABS: 1.0000 | ORAL_TABLET | Freq: Two times a day (BID) | ORAL | Status: DC | PRN

## 2015-10-14 ENCOUNTER — Telehealth: Payer: Self-pay | Admitting: Internal Medicine

## 2015-10-14 NOTE — Telephone Encounter (Signed)
APPT. REMINDER CALL, LMTCB IF HE NEEDS TO CANCEL °

## 2015-10-15 ENCOUNTER — Ambulatory Visit (INDEPENDENT_AMBULATORY_CARE_PROVIDER_SITE_OTHER): Payer: Self-pay | Admitting: Internal Medicine

## 2015-10-15 ENCOUNTER — Encounter: Payer: Self-pay | Admitting: Internal Medicine

## 2015-10-15 VITALS — BP 130/84 | HR 70 | Temp 98.3°F | Ht 70.0 in | Wt 150.2 lb

## 2015-10-15 DIAGNOSIS — I10 Essential (primary) hypertension: Secondary | ICD-10-CM

## 2015-10-15 DIAGNOSIS — F17210 Nicotine dependence, cigarettes, uncomplicated: Secondary | ICD-10-CM

## 2015-10-15 DIAGNOSIS — R0789 Other chest pain: Secondary | ICD-10-CM

## 2015-10-15 DIAGNOSIS — Z95810 Presence of automatic (implantable) cardiac defibrillator: Secondary | ICD-10-CM

## 2015-10-15 DIAGNOSIS — Z8674 Personal history of sudden cardiac arrest: Secondary | ICD-10-CM

## 2015-10-15 MED ORDER — OXYCODONE-ACETAMINOPHEN 5-325 MG PO TABS: ORAL_TABLET | ORAL | Status: DC

## 2015-10-15 NOTE — Progress Notes (Signed)
Patient ID: [name] [name], male   DOB: 10/05/1960, 55 y.o.   MRN: [id number]   Subjective:   Patient ID: [name] [name] male   DOB: 19-Jan-1961 55 y.o.   MRN: [id number]  HPI: [name] [name] is a 55 y.o. male with PMH as below, here for f/u [facility] chest pain.  Please see Problem-Based charting for the status of the patient's chronic medical issues.     Past Medical History  Diagnosis Date  . Cardiac arrest ([place]) 12/2014  . PULMONARY NODULE 12/21/2009    Stable 28mm per repeat CXR 4/11  . PTSD (post-traumatic stress disorder)     Found deceased brother- resolved per pt  . Tobacco abuse     1 ppd x 36 ys. Quit 04/22/12  . IV drug abuse     Quit. Used in the 14s  . Depression     Resolved- pt contributes to previous drug use  . Anemia   . Substance abuse   . Hemorrhoids   . Hepatitis C     S/p completing treatment at [facility]   Current Outpatient Prescriptions  Medication Sig Dispense Refill  . ibuprofen (ADVIL,MOTRIN) 200 MG tablet Take 400-600 mg by mouth every morning.     Marland Kitchen lisinopril (PRINIVIL,ZESTRIL) 10 MG tablet Take 1 tablet (10 mg total) by mouth daily. 90 tablet 3  . Multiple Vitamin (MULTIVITAMIN WITH MINERALS) TABS tablet Take 1 tablet by mouth daily.    . nicotine polacrilex (NICORETTE) 2 MG gum Take 1 each (2 mg total) by mouth as needed for smoking cessation. 100 tablet 0  . nitroGLYCERIN (NITROSTAT) 0.4 MG SL tablet Place 1 tablet (0.4 mg total) under the tongue every 5 (five) minutes as needed for chest pain. 25 tablet 5  . oxyCODONE-acetaminophen (PERCOCET) 5-325 MG tablet Take 1 tablet by mouth every 12 (twelve) hours as needed for moderate pain. 30 tablet 0   No current facility-administered medications for this visit.   Family History  Problem Relation Age of Onset  . Hyperlipidemia Mother   . Hypertension Mother   . COPD Mother   . Colon polyps Mother 10  . Heart disease Father     Decease from MI at 21yo  . Cancer Maternal Grandfather     Colon cancer,  deceased  . Alcohol abuse Maternal Grandfather   . Colon cancer Maternal Grandfather 16  . Esophageal cancer Neg Hx   . Rectal cancer Neg Hx    Social History   Social History  . Marital Status: Married    Spouse Name: N/A  . Number of Children: N/A  . Years of Education: N/A   Social History Main Topics  . Smoking status: Current Every Day Smoker -- 0.50 packs/day for 29 years    Types: Cigarettes  . Smokeless tobacco: Never Used     Comment: 3/4 OF PACK  . Alcohol Use: 3.6 oz/week    6 Cans of beer per week     Comment: occassional  . Drug Use: Yes    Special: "Crack" cocaine, Marijuana, Heroin     Comment: Quit IV drugs in the '80s. Last crack use 2009 or 2010.  Marland Kitchen Sexual Activity:    Partners: Female   Other Topics Concern  . Not on file   Social History Narrative   ** Merged History Encounter **       Review of Systems: No fevers, chills, SOB, nausea, or vomiting.  He endorses continued chest wall pain a/w exertion and lying  on left side on ICD. Objective:  Physical Exam: There were no vitals filed for this visit. Physical Exam  Constitutional: He is oriented to person, place, and time. No distress.  HENT:  Head: Normocephalic and atraumatic.  Eyes: EOM are normal. No scleral icterus.  Neck: No JVD present. No tracheal deviation present.  Cardiovascular: Normal rate, regular rhythm and normal heart sounds.   Pulmonary/Chest: Effort normal and breath sounds normal. No stridor. No respiratory distress. He has no wheezes.  Musculoskeletal: Normal range of motion. He exhibits no edema.  Neurological: He is alert and oriented to person, place, and time.  Skin: Skin is warm and dry. He is not diaphoretic.     Assessment & Plan:   Patient and case were discussed with Dr. Beryle Beams.  Please refer to Problem Based charting for further documentation.

## 2015-10-15 NOTE — Assessment & Plan Note (Signed)
Patient reports continued chest wall pain a/w exertion and lying on his left side on the ICD.  He is taking 1/2 - 1 tab of his Oxycodone daily as needed for the pain.  He took 1/4 tab last night and 1/2 tab a couple days ago.  In addition, he may take Tylenol 1000 mg twice daily as needed.  He is not taking any Ibuprofen at this time.  He continues to smoke 1/2 ppd.  A/P: MSK chest pain.  Patient amenable to decreasing opiate dose and frequency to wean off narcotics.  Will decrease prescription to 25 tabs. - Oxycodone 5 mg, 1/2 - 1 tab daily as needed for chest wall pain, #25 tabs

## 2015-10-15 NOTE — Progress Notes (Signed)
Medicine attending: Medical history, presenting problems, physical findings, and medications, reviewed with resident physician Dr Nicholas Taylor on the day of the patient visit and I concur with his evaluation and management plan. 

## 2015-10-15 NOTE — Patient Instructions (Signed)
1. Take Oxycodone 2.5 - 5 mg daily as needed for chest wall pain. 2. You may continue to alternate Tylenol [phone] mg and Ibuprofen 600 mg every 8 hours as needed for chest wall pain. 3. Take your baby aspirin every day.  Chest Wall Pain Chest wall pain is pain in or around the bones and muscles of your chest. Sometimes, an injury causes this pain. Sometimes, the cause may not be known. This pain may take several weeks or longer to get better. HOME CARE INSTRUCTIONS  Pay attention to any changes in your symptoms. Take these actions to help with your pain:   Rest as told by your health care provider.   Avoid activities that cause pain. These include any activities that use your chest muscles or your abdominal and side muscles to lift heavy items.   If directed, apply ice to the painful area:  Put ice in a plastic bag.  Place a towel between your skin and the bag.  Leave the ice on for 20 minutes, 2-3 times per day.  Take over-the-counter and prescription medicines only as told by your health care provider.  Do not use tobacco products, including cigarettes, chewing tobacco, and e-cigarettes. If you need help quitting, ask your health care provider.  Keep all follow-up visits as told by your health care provider. This is important. SEEK MEDICAL CARE IF:  You have a fever.  Your chest pain becomes worse.  You have new symptoms. SEEK IMMEDIATE MEDICAL CARE IF:  You have nausea or vomiting.  You feel sweaty or light-headed.  You have a cough with phlegm (sputum) or you cough up blood.  You develop shortness of breath.   This information is not intended to replace advice given to you by your health care provider. Make sure you discuss any questions you have with your health care provider.   Document Released: 08/14/2005 Document Revised: 05/05/2015 Document Reviewed: 11/09/2014 Elsevier Interactive Patient Education [employer].

## 2015-10-15 NOTE — Assessment & Plan Note (Signed)
Patient is doing well since his heart attack and increasing his physical activity.  He has not been taking his ASA and continues to smoke 1/2 ppd.  He understands that tobacco is his largest modifiable risk factor at this time and states he will continue to cut back to decrease his use.  A/P: s/p cardiac arrest and ICD placement - Smoking cessation - ASA daily - Lisinopril 10 mg daily

## 2015-10-15 NOTE — Assessment & Plan Note (Signed)
BP Readings from Last 3 Encounters:  10/15/15 130/84  07/02/15 133/86  05/11/15 150/90    Lab Results  Component Value Date   NA 138 02/04/2015   K 4.8 02/04/2015   CREATININE 0.78 02/04/2015    Assessment: Blood pressure control:  controlled Progress toward BP goal:    Comments: on Lisinopril 10 mg daily  Plan: Medications:  continue current medications, Lisinopril 10 mg Educational resources provided: handout Self management tools provided:   Other plans:

## 2015-10-23 LAB — TOXASSURE SELECT,+ANTIDEPR,UR: PDF: 0

## 2015-11-09 ENCOUNTER — Ambulatory Visit (INDEPENDENT_AMBULATORY_CARE_PROVIDER_SITE_OTHER): Payer: Self-pay | Admitting: *Deleted

## 2015-11-09 DIAGNOSIS — I469 Cardiac arrest, cause unspecified: Secondary | ICD-10-CM

## 2015-11-09 NOTE — Progress Notes (Signed)
Remote ICD transmission.   

## 2015-11-22 ENCOUNTER — Other Ambulatory Visit: Payer: Self-pay | Admitting: Internal Medicine

## 2015-11-22 DIAGNOSIS — R0789 Other chest pain: Secondary | ICD-10-CM

## 2015-11-22 NOTE — Telephone Encounter (Signed)
10/15/15 #25 given at [place] for chest wall pain 2/17 UDS

## 2015-11-22 NOTE — Telephone Encounter (Signed)
Requesting oxycodone to be filled.  

## 2015-11-23 MED ORDER — OXYCODONE-ACETAMINOPHEN 5-325 MG PO TABS: ORAL_TABLET | ORAL | Status: DC

## 2015-11-23 NOTE — Telephone Encounter (Signed)
Called, got vmail, will inform script ready on callback

## 2015-12-14 ENCOUNTER — Encounter: Payer: Self-pay | Admitting: Cardiology

## 2015-12-14 LAB — CUP PACEART REMOTE DEVICE CHECK
Battery Remaining Percentage: 92 %
Date Time Interrogation Session: 20170314142000
MDC IDC LEAD IMPLANT DT: 20160603
MDC IDC LEAD LOCATION: 753858
MDC IDC LEAD MODEL: 3401
Pulse Gen Serial Number: 115885

## 2015-12-16 ENCOUNTER — Telehealth: Payer: Self-pay | Admitting: Internal Medicine

## 2015-12-16 NOTE — Telephone Encounter (Signed)
APPT. REMINDER CALL, LMTCB °

## 2015-12-17 ENCOUNTER — Ambulatory Visit (INDEPENDENT_AMBULATORY_CARE_PROVIDER_SITE_OTHER): Payer: Self-pay | Admitting: Internal Medicine

## 2015-12-17 ENCOUNTER — Encounter: Payer: Self-pay | Admitting: Internal Medicine

## 2015-12-17 VITALS — BP 121/83 | HR 60 | Temp 98.0°F | Ht 70.0 in | Wt 141.9 lb

## 2015-12-17 DIAGNOSIS — R0789 Other chest pain: Secondary | ICD-10-CM

## 2015-12-17 DIAGNOSIS — I10 Essential (primary) hypertension: Secondary | ICD-10-CM

## 2015-12-17 MED ORDER — OXYCODONE-ACETAMINOPHEN 5-325 MG PO TABS: ORAL_TABLET | ORAL | Status: DC

## 2015-12-17 MED ORDER — DICLOFENAC SODIUM 1 % TD GEL: 2.0000 g | Freq: Four times a day (QID) | TRANSDERMAL | Status: DC

## 2015-12-17 NOTE — Assessment & Plan Note (Signed)
BP Readings from Last 3 Encounters:  12/17/15 121/83  10/15/15 130/84  07/02/15 133/86    Lab Results  Component Value Date   NA 138 02/04/2015   K 4.8 02/04/2015   CREATININE 0.78 02/04/2015    Assessment: Blood pressure control:  good Progress toward BP goal:    Comments:   Plan: Medications:  continue current medications, Lisinopril Educational resources provided:  (DECLINED) Self management tools provided:   Other plans: recheck 3 months

## 2015-12-17 NOTE — Patient Instructions (Signed)
1. Take Oxycodone 1/2 tab daily as needed for pain. 2. If you need additional pain relief, you may take an additional 400 mg of Ibuprofen. 3. Continue your blood pressure medications.  Chest Wall Pain Chest wall pain is pain in or around the bones and muscles of your chest. Sometimes, an injury causes this pain. Sometimes, the cause may not be known. This pain may take several weeks or longer to get better. HOME CARE INSTRUCTIONS  Pay attention to any changes in your symptoms. Take these actions to help with your pain:   Rest as told by your health care provider.   Avoid activities that cause pain. These include any activities that use your chest muscles or your abdominal and side muscles to lift heavy items.   If directed, apply ice to the painful area:  Put ice in a plastic bag.  Place a towel between your skin and the bag.  Leave the ice on for 20 minutes, 2-3 times per day.  Take over-the-counter and prescription medicines only as told by your health care provider.  Do not use tobacco products, including cigarettes, chewing tobacco, and e-cigarettes. If you need help quitting, ask your health care provider.  Keep all follow-up visits as told by your health care provider. This is important. SEEK MEDICAL CARE IF:  You have a fever.  Your chest pain becomes worse.  You have new symptoms. SEEK IMMEDIATE MEDICAL CARE IF:  You have nausea or vomiting.  You feel sweaty or light-headed.  You have a cough with phlegm (sputum) or you cough up blood.  You develop shortness of breath.   This information is not intended to replace advice given to you by your health care provider. Make sure you discuss any questions you have with your health care provider.   Document Released: 08/14/2005 Document Revised: 05/05/2015 Document Reviewed: 11/09/2014 Elsevier Interactive Patient Education [employer].

## 2015-12-17 NOTE — Progress Notes (Signed)
Patient ID: [name] [name], male   DOB: Aug 25, 1961, 55 y.o.   MRN: [id number]   Subjective:   Patient ID: [name] [name] male   DOB: Nov 30, 1960 55 y.o.   MRN: [id number]  HPI: [name] [name] is a 55 y.o. male with PMH as below, here for f/u MSK chest pain and HTN.  Please see Problem-Based charting for the status of the patient's chronic medical issues.     Past Medical History  Diagnosis Date  . Cardiac arrest ([place]) 12/2014  . PULMONARY NODULE 12/21/2009    Stable 36mm per repeat CXR 4/11  . PTSD (post-traumatic stress disorder)     Found deceased brother- resolved per pt  . Tobacco abuse     1 ppd x 89 ys. Quit 04/22/12  . IV drug abuse     Quit. Used in the 60s  . Depression     Resolved- pt contributes to previous drug use  . Anemia   . Substance abuse   . Hemorrhoids   . Hepatitis C     S/p completing treatment at [facility]   Current Outpatient Prescriptions  Medication Sig Dispense Refill  . ibuprofen (ADVIL,MOTRIN) 200 MG tablet Take 400-600 mg by mouth every morning.     Marland Kitchen lisinopril (PRINIVIL,ZESTRIL) 10 MG tablet Take 1 tablet (10 mg total) by mouth daily. 90 tablet 3  . Multiple Vitamin (MULTIVITAMIN WITH MINERALS) TABS tablet Take 1 tablet by mouth daily.    . nicotine polacrilex (NICORETTE) 2 MG gum Take 1 each (2 mg total) by mouth as needed for smoking cessation. 100 tablet 0  . nitroGLYCERIN (NITROSTAT) 0.4 MG SL tablet Place 1 tablet (0.4 mg total) under the tongue every 5 (five) minutes as needed for chest pain. 25 tablet 5  . oxyCODONE-acetaminophen (PERCOCET) 5-325 MG tablet Take 1/2 to 1 tab daily as needed for chest wall pain. 22 tablet 0   No current facility-administered medications for this visit.   Family History  Problem Relation Age of Onset  . Hyperlipidemia Mother   . Hypertension Mother   . COPD Mother   . Colon polyps Mother 35  . Heart disease Father     Decease from MI at 3yo  . Cancer Maternal Grandfather     Colon cancer, deceased  .  Alcohol abuse Maternal Grandfather   . Colon cancer Maternal Grandfather 56  . Esophageal cancer Neg Hx   . Rectal cancer Neg Hx    Social History   Social History  . Marital Status: Married    Spouse Name: N/A  . Number of Children: N/A  . Years of Education: N/A   Social History Main Topics  . Smoking status: Current Every Day Smoker -- 0.50 packs/day for 29 years    Types: Cigarettes  . Smokeless tobacco: Never Used     Comment: 1/2 DAY  . Alcohol Use: 3.6 oz/week    6 Cans of beer per week     Comment: occassional  . Drug Use: Yes    Special: "Crack" cocaine, Marijuana, Heroin     Comment: Quit IV drugs in the '80s. Last crack use 2009 or 2010.  Marland Kitchen Sexual Activity:    Partners: Female   Other Topics Concern  . Not on file   Social History Narrative   ** Merged History Encounter **       Review of Systems: No anginal pain, SOB, or trouble urinating.  Unchanged sternal chest pain. Objective:  Physical Exam: There were no  vitals filed for this visit. Physical Exam  Constitutional: He is oriented to person, place, and time and well-developed, well-nourished, and in no distress. No distress.  HENT:  Head: Normocephalic and atraumatic.  Eyes: EOM are normal. No scleral icterus.  Neck: No tracheal deviation present.  Cardiovascular: Normal rate, regular rhythm and normal heart sounds.   Anterior chest wall TTP along sternal border.  Pulmonary/Chest: Effort normal and breath sounds normal. No stridor. He has no wheezes.  Abdominal: Soft. He exhibits no distension. There is no tenderness. There is no rebound and no guarding.  Musculoskeletal: He exhibits no edema.  Neurological: He is alert and oriented to person, place, and time.  Skin: Skin is warm and dry. He is not diaphoretic.     Assessment & Plan:   Patient and case were discussed with Dr. Eppie Gibson.  Please refer to Problem Based charting for further documentation.

## 2015-12-17 NOTE — Assessment & Plan Note (Addendum)
Patient continues to have chest wall pain, though it continues to improve.  He takes Ibuprofen 400 mg every morning, Tylenol 500 mg in the afternoon, and Oxycodone 2.5-5 mg nightly for the pain.  He denies angina or SOB.  He has been very active in the yard and around the house without anginal type pains.  He continues to be agreeable to opiate wean.  A/P: MSK Chest Pain.  Will decrease Oxycodone dose and number to continue wean.  Can also add voltaren gel for added antiinflammatory effect. - Oxycodone 2.5 mg daily PRN #19 (then #16, then #13). - Voltaren gel AAA QID PRN

## 2015-12-17 NOTE — Progress Notes (Signed)
Medicine attending: Medical history, presenting problems, physical findings, and medications, reviewed with resident physician Dr Laureen Abrahams on the day of the patient visit and I concur with his evaluation and management plan.

## 2015-12-28 ENCOUNTER — Encounter: Payer: Self-pay | Admitting: Cardiology

## 2015-12-30 ENCOUNTER — Ambulatory Visit: Payer: Self-pay

## 2016-02-15 ENCOUNTER — Encounter: Payer: Self-pay | Admitting: Internal Medicine

## 2016-02-15 ENCOUNTER — Ambulatory Visit (INDEPENDENT_AMBULATORY_CARE_PROVIDER_SITE_OTHER): Payer: Self-pay | Admitting: Internal Medicine

## 2016-02-15 VITALS — BP 122/70 | HR 55 | Ht 70.0 in | Wt 143.0 lb

## 2016-02-15 DIAGNOSIS — I429 Cardiomyopathy, unspecified: Secondary | ICD-10-CM

## 2016-02-15 DIAGNOSIS — Z95810 Presence of automatic (implantable) cardiac defibrillator: Secondary | ICD-10-CM

## 2016-02-15 DIAGNOSIS — I469 Cardiac arrest, cause unspecified: Secondary | ICD-10-CM

## 2016-02-15 DIAGNOSIS — I428 Other cardiomyopathies: Secondary | ICD-10-CM

## 2016-02-15 LAB — CUP PACEART INCLINIC DEVICE CHECK
Implantable Lead Location: 753858
Implantable Lead Model: 3401
MDC IDC LEAD IMPLANT DT: 20160603
MDC IDC SESS DTM: 20170620150640
Pulse Gen Serial Number: 115885

## 2016-02-15 NOTE — Patient Instructions (Signed)

## 2016-02-15 NOTE — Progress Notes (Signed)
Patient Care Team: Iline Oven, MD as PCP - General   HPI  [name] [name] is a 55 y.o. 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 Ejection fraction initially was 35% with subsequent normalization. Catheterization 5/16 demonstrated no obstructive coronary disease and normalization of LV function  Brugada-flecainide challenge was negative.  Signal average was negative.  MRI was mildly abnormal in that there was no structural heart disease but a small degree of gadolinium enhancement in the inferior wall   The patient denies chest pain, shortness of breath, nocturnal dyspnea, orthopnea or peripheral edema.  There have been no palpitations, lightheadedness or syncope.    Records and Results Reviewed  Past Medical History  Diagnosis Date  . Cardiac arrest ([place]) 12/2014  . PULMONARY NODULE 12/21/2009    Stable 6mm per repeat CXR 4/11  . PTSD (post-traumatic stress disorder)     Found deceased brother- resolved per pt  . Tobacco abuse     1 ppd x 61 ys. Quit 04/22/12  . IV drug abuse     Quit. Used in the 62s  . Depression     Resolved- pt contributes to previous drug use  . Anemia   . Substance abuse   . Hemorrhoids   . Hepatitis C     S/p completing treatment at [facility]    Past Surgical History  Procedure Laterality Date  . Fracture surgery Left     In high school  . Cyst removal neck  2011  . Multiple tooth extractions  1999  . Wisdom tooth extraction    . Cardiac catheterization N/A 01/26/2015    Procedure: Left Heart Cath and Coronary Angiography;  Surgeon: Leonie Man, MD;  Location: [place] [facility];  Service: Cardiovascular;  Laterality: N/A;  . Ep implantable device N/A 01/29/2015    Procedure: SubQ ICD Implant;  Surgeon: Deboraha Sprang, MD;  Location: [place] [facility];  Service: Cardiovascular;  Laterality: N/A;    Current Outpatient Prescriptions    Medication Sig Dispense Refill  . ibuprofen (ADVIL,MOTRIN) 200 MG tablet Take 400-600 mg by mouth every morning.     Marland Kitchen lisinopril (PRINIVIL,ZESTRIL) 10 MG tablet Take 1 tablet (10 mg total) by mouth daily. 90 tablet 3  . Multiple Vitamin (MULTIVITAMIN WITH MINERALS) TABS tablet Take 1 tablet by mouth daily.    . nitroGLYCERIN (NITROSTAT) 0.4 MG SL tablet Place 1 tablet (0.4 mg total) under the tongue every 5 (five) minutes as needed for chest pain. 25 tablet 5  . oxyCODONE-acetaminophen (PERCOCET) 5-325 MG tablet Take 1/2 tab daily as needed for chest wall pain. 13 tablet 0   No current facility-administered medications for this visit.    No Known Allergies    Review of Systems negative except from HPI and PMH  Physical Exam BP 122/70 mmHg  Pulse 55  Ht 5\' 10"  (1.778 m)  Wt 143 lb (64.864 kg)  BMI 20.52 kg/m2  SpO2 97% Well developed and well nourished in no acute distress HENT normal E scleral and icterus clear Neck Supple JVP flat; carotids brisk and full Clear to ausculation Device pocket well healed; without hematoma or erythema.  There is no tethering   Regular rate and rhythm, no murmurs gallops or rub Soft with active bowel sounds No clubbing cyanosis  Edema Alert and oriented, grossly normal motor and sensory function Skin Warm and Dry  Assessment and  Plan   Aborted Cardiac Arrest  Hypertension   SICD  The patient's device was interrogated.  The information was reviewed. No changes were made in the programming.    Smoking    Encouraged to stop smoking Will increase lisinopril and stop carvedilol with resolved cardiomypathy

## 2016-02-16 ENCOUNTER — Encounter: Payer: Self-pay | Admitting: *Deleted

## 2016-03-10 ENCOUNTER — Ambulatory Visit (INDEPENDENT_AMBULATORY_CARE_PROVIDER_SITE_OTHER): Payer: Self-pay | Admitting: Internal Medicine

## 2016-03-10 ENCOUNTER — Encounter: Payer: Self-pay | Admitting: Internal Medicine

## 2016-03-10 VITALS — BP 113/69 | HR 57 | Temp 98.0°F | Ht 70.0 in | Wt 142.4 lb

## 2016-03-10 DIAGNOSIS — R0789 Other chest pain: Secondary | ICD-10-CM

## 2016-03-10 DIAGNOSIS — M7582 Other shoulder lesions, left shoulder: Principal | ICD-10-CM

## 2016-03-10 DIAGNOSIS — M25512 Pain in left shoulder: Secondary | ICD-10-CM

## 2016-03-10 DIAGNOSIS — I10 Essential (primary) hypertension: Secondary | ICD-10-CM

## 2016-03-10 DIAGNOSIS — M778 Other enthesopathies, not elsewhere classified: Secondary | ICD-10-CM

## 2016-03-10 MED ORDER — NAPROXEN 500 MG PO TABS: 500.0000 mg | ORAL_TABLET | Freq: Two times a day (BID) | ORAL | Status: DC

## 2016-03-10 MED ORDER — LISINOPRIL 10 MG PO TABS: 10.0000 mg | ORAL_TABLET | Freq: Every day | ORAL | Status: DC

## 2016-03-10 NOTE — Patient Instructions (Signed)
It was a pleasure to meet you today Mr. Mackie.  I recommend rest not lifting any weight greater than 20 lbs overhead with your left arm for the next 2-3 days. Also start taking naproxen 500mg  twice daily with meals as an antiinflammatory treatment. If your symptoms are not improving within this time please call us back.  Thanks.

## 2016-03-12 DIAGNOSIS — M778 Other enthesopathies, not elsewhere classified: Secondary | ICD-10-CM | POA: Insufficient documentation

## 2016-03-12 DIAGNOSIS — M7582 Other shoulder lesions, left shoulder: Principal | ICD-10-CM

## 2016-03-12 NOTE — Assessment & Plan Note (Addendum)
A: One week of shoulder pain that seems consistent with tendinitis at the insertion of the infraspinatus or possibly teres minor. 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 He has had this before and it resolved spontaneously.  P: -Recommended to rest his shoulder no 20 lbs or greater overhead weight for 48-72 hrs -Precribed naproxen 500mg  BID for 14 days as a temporary antiinflammatory dose and instructed to stop taking PRN ibuprofen during this time

## 2016-03-12 NOTE — Progress Notes (Signed)
   CC: Left shoulder pain  HPI:  [name] [name] is a 55 y.o. male with PMHx detailed below presenting with worsened left shoulder pain for the past week. The onset was gradual and he does not recall any particular injury or recent event. The pain has increased in the past 3 days. It is not very painful at rest but hurts when active and doing anything overhead, and it also hurts lying on his side in bed. He is physically active in [employer]. He has had similar pain in this location about 6 months ago that improved on its own.  See problem based assessment and plan below for additional details.  Past Medical History  Diagnosis Date  . Cardiac arrest ([place]) 12/2014  . PULMONARY NODULE 12/21/2009    Stable 17mm per repeat CXR 4/11  . PTSD (post-traumatic stress disorder)     Found deceased brother- resolved per pt  . Tobacco abuse     1 ppd x 33 ys. Quit 04/22/12  . IV drug abuse     Quit. Used in the 71s  . Depression     Resolved- pt contributes to previous drug use  . Anemia   . Substance abuse   . Hemorrhoids   . Hepatitis C     S/p completing treatment at [facility]    Review of Systems: Review of Systems  Respiratory: Negative for shortness of breath.   Cardiovascular: Negative for chest pain.  Gastrointestinal: Negative for abdominal pain.  Musculoskeletal: Positive for joint pain.  Skin: Negative for rash.  Neurological: Negative for focal weakness and weakness.     Physical Exam: Filed Vitals:   03/10/16 1538  BP: 113/69  Pulse: 57  Temp: 98 F (36.7 C)  TempSrc: Oral  Height: 5\' 10"  (1.778 m)  Weight: 142 lb 6.4 oz (64.592 kg)  SpO2: 100%   GENERAL- alert, co-operative, NAD CARDIAC- RRR, no murmurs, rubs or gallops, ICD present on left RESP- CTAB, no wheezes or crackles NEURO- No left arm numbness or weakness EXTREMITIES- Left shoulder is nontender and nonerythematous, pain reproducible by active extension SKIN- Warm, dry, No rash or lesion.  Filed Vitals:     03/10/16 1538  BP: 113/69  Pulse: 57  Temp: 98 F (36.7 C)  TempSrc: Oral  Height: 5\' 10"  (1.778 m)  Weight: 142 lb 6.4 oz (64.592 kg)  SpO2: 100%    Assessment & Plan:   See encounters tab for problem based medical decision making.   Patient discussed with Dr. Daryll Drown

## 2016-03-12 NOTE — Assessment & Plan Note (Signed)
A: His blood pressure is doing very well on lisinopril 10mg  daily alone, and he needs his medication refilled today. He has not had any problems with dizziness on standing in the past few months.   P: -Refill lisinopril 10mg  daily

## 2016-03-13 NOTE — Addendum Note (Signed)
Addended by: Collier Salina on: 03/13/2016 01:44 AM   Modules accepted: Miquel Dunn

## 2016-03-13 NOTE — Progress Notes (Signed)
Internal Medicine Clinic Attending  Case discussed with Dr. Rice at the time of the visit.  We reviewed the resident's history and exam and pertinent patient test results.  I agree with the assessment, diagnosis, and plan of care documented in the resident's note.  

## 2016-04-05 ENCOUNTER — Other Ambulatory Visit: Payer: Self-pay | Admitting: *Deleted

## 2016-04-05 NOTE — Telephone Encounter (Signed)
Wife here to pick up her RX and wanted to know if we could refill her husbands while she was here.

## 2016-04-06 MED ORDER — NITROGLYCERIN 0.4 MG SL SUBL: 0.4000 mg | SUBLINGUAL_TABLET | SUBLINGUAL | 1 refills | Status: DC | PRN

## 2016-04-06 NOTE — Telephone Encounter (Signed)
F/U     Pt wife calling about a refill. Please call.     *STAT* If patient is at the pharmacy, call can be transferred to refill team.   1. Which medications need to be refilled? (please list name of each medication and dose if known) nitroglyerin 0.4mg   2. Which pharmacy/location (including street and city if local pharmacy) is medication to be sent to? Pt wife needs the paper prescription in order to be on the Maps program at the health department.   3. Do they need a 30 day or 90 day supply?not sure

## 2016-04-19 NOTE — Progress Notes (Signed)
   CC: follow-up on blood pressure  HPI:  [name] is a 55 y.o.male with past medical history noted below presents to [facility] for health maintenance follow-up. Patient states he is compliant on lisinopril 10 mg daily. He denies any dizziness or lightheadedness. Patient continues to have substernal pain and has been treated with oxycodone-acetaminophen 5-325mg .  He has been weaned down, the last 3 prescriptions were 19 pills then 16 and 13. Patient states that substernal pain is worse after work. 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 He denies drug use and reports last taking oxycodone-acetaminophen 3 days ago. He reports only needing half a tablet 3-4 times a week.  Patient states he has tried naproxen for his shoulder pain with little benefit and states it did not help for his chest pain.    Past Medical History:  Diagnosis Date  . Anemia   . Cardiac arrest ([place]) 12/2014  . Depression    Resolved- pt contributes to previous drug use  . Hemorrhoids   . Hepatitis C    S/p completing treatment at [facility]  . IV drug abuse    Quit. Used in the 79s  . PTSD (post-traumatic stress disorder)    Found deceased brother- resolved per pt  . PULMONARY NODULE 12/21/2009   Stable 73mm per repeat CXR 4/11  . Substance abuse   . Tobacco abuse    1 ppd x 87 ys. Quit 04/22/12    Review of Systems:  Review of Systems  Eyes: Negative for blurred vision.  Respiratory: Negative for shortness of breath.   Cardiovascular: Positive for chest pain.  Gastrointestinal: Negative for abdominal pain.  Neurological: Negative for headaches.     Physical Exam:  Vitals:   04/20/16 1414  BP: 117/65  Pulse: (!) 57  Temp: 98.1 F (36.7 C)  TempSrc: Oral  Weight: 147 lb 12.8 oz (67 kg)  Height: 5\' 10"  (1.778 m)   Physical Exam  Constitutional: He is well-developed, well-nourished, and in no distress.  HENT:  Head: Normocephalic and atraumatic.  Cardiovascular: Normal rate.  Exam  reveals no gallop and no friction rub.   Defibrillator placed Irregular rhythm     Pulmonary/Chest: He exhibits tenderness.  Tenderness along sternal border. Pain reproducible with movement. Lungs are clear to auscultation bilaterally with no rales, rhonchi or wheezing. Effort is normal and moving air well.   Abdominal: Soft. He exhibits no distension. There is no tenderness.  Musculoskeletal: He exhibits no edema.  Motor strength 5 out of 5 bilaterally in lower and upper extremities  Skin: Skin is warm and dry.     Assessment & Plan:   See encounters tab for problem based medical decision making.   Patient seen with Dr. Evette Doffing

## 2016-04-20 ENCOUNTER — Ambulatory Visit (INDEPENDENT_AMBULATORY_CARE_PROVIDER_SITE_OTHER): Payer: Self-pay | Admitting: Internal Medicine

## 2016-04-20 ENCOUNTER — Encounter: Payer: Self-pay | Admitting: Internal Medicine

## 2016-04-20 DIAGNOSIS — R0789 Other chest pain: Secondary | ICD-10-CM

## 2016-04-20 DIAGNOSIS — F17210 Nicotine dependence, cigarettes, uncomplicated: Secondary | ICD-10-CM

## 2016-04-20 DIAGNOSIS — Z23 Encounter for immunization: Secondary | ICD-10-CM

## 2016-04-20 DIAGNOSIS — I10 Essential (primary) hypertension: Secondary | ICD-10-CM

## 2016-04-20 MED ORDER — OXYCODONE-ACETAMINOPHEN 5-325 MG PO TABS: ORAL_TABLET | ORAL | 0 refills | Status: DC

## 2016-04-20 NOTE — Assessment & Plan Note (Signed)
Assessment: essential hypertension Patient currently takes lisinopril 10 mg daily and does not report any issues with the medication. His blood pressure today is 117/65.  Blood pressure is stable and at goal   Plan: -continue lisinopril 10 mg daily

## 2016-04-20 NOTE — Patient Instructions (Addendum)
Mr. Dado  Please continue to take her lisinopril 10 mg daily Please follow-up in 3 months

## 2016-04-20 NOTE — Assessment & Plan Note (Addendum)
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 He has been weaned down by previous provider in the last 3 months to 13 pills a month. He states that he only needs half a pill 3-4 times a week to help with pain.  Plan: - will continue to wean down Percocet. Next prescription is for 10 pills. Patient was given 2 more prescriptions in office for a total of 3 month supply. The last 2 prescriptions were for 7 pills each of Percocet. I do not have a follow-up appointment available in 2 months and I did not want to keep weaning him down and then on follow-up have him tell me that he needed more so I continued at 7 pills.  According to history 7 pills monthly should be enough for his chest pain. Will have patient follow-up in 3 months to reassess chest pain.

## 2016-04-21 NOTE — Progress Notes (Signed)
Internal Medicine Clinic Attending  I saw and evaluated the patient.  I personally confirmed the key portions of the history and exam documented by Dr. Hoffman and I reviewed pertinent patient test results.  The assessment, diagnosis, and plan were formulated together and I agree with the documentation in the resident's note.      

## 2016-05-16 ENCOUNTER — Telehealth: Payer: Self-pay | Admitting: Cardiology

## 2016-05-16 ENCOUNTER — Ambulatory Visit (INDEPENDENT_AMBULATORY_CARE_PROVIDER_SITE_OTHER): Payer: Self-pay | Admitting: *Deleted

## 2016-05-16 DIAGNOSIS — I469 Cardiac arrest, cause unspecified: Secondary | ICD-10-CM

## 2016-05-16 NOTE — Progress Notes (Signed)
Remote ICD transmission.   

## 2016-05-16 NOTE — Telephone Encounter (Signed)
Confirmed remote transmission w/ pt wife.   

## 2016-05-17 ENCOUNTER — Encounter: Payer: Self-pay | Admitting: Cardiology

## 2016-06-02 ENCOUNTER — Encounter: Payer: Self-pay | Admitting: Cardiology

## 2016-06-05 LAB — CUP PACEART REMOTE DEVICE CHECK
Battery Remaining Percentage: 86 %
Date Time Interrogation Session: 20170919153500
Implantable Lead Location: 753858
Implantable Lead Model: 3401
MDC IDC LEAD IMPLANT DT: 20160603
MDC IDC PG SERIAL: 115885

## 2016-06-22 ENCOUNTER — Ambulatory Visit: Payer: Self-pay

## 2016-07-26 ENCOUNTER — Telehealth: Payer: Self-pay | Admitting: Internal Medicine

## 2016-07-26 NOTE — Progress Notes (Signed)
   CC: follow-up for essential hypertension  HPI:  [name] is a 55 y.o. gentleman with history noted below that presents to the clinic for follow-up on hypertension. He currently takes lisinopril 10 mg daily and states that he has no side effects from the medication. He denies any blurry vision, chest pain, shortness of breath, dizziness or lower extremity edema.  He reports that he continues to smoke and has decreased to 5 cigarettes a day. He states he is not ready to quit.   Past Medical History:  Diagnosis Date  . Anemia   . Cardiac arrest ([place]) 12/2014  . Depression    Resolved- pt contributes to previous drug use  . Hemorrhoids   . Hepatitis C    S/p completing treatment at [facility]  . IV drug abuse    Quit. Used in the 70s  . PTSD (post-traumatic stress disorder)    Found deceased brother- resolved per pt  . PULMONARY NODULE 12/21/2009   Stable 23mm per repeat CXR 4/11  . Substance abuse   . Tobacco abuse    1 ppd x 33 ys. Quit 04/22/12    Review of Systems:  As noted per HPI  Physical Exam:  Vitals:   07/27/16 1333  BP: 122/74  Pulse: 62  Temp: 97.5 F (36.4 C)  TempSrc: Oral  SpO2: 99%  Weight: 153 lb 6.4 oz (69.6 kg)  Height: 5\' 10"  (1.778 m)   Physical Exam  Constitutional: He is well-developed, well-nourished, and in no distress.  Cardiovascular: Normal rate, regular rhythm and normal heart sounds.  Exam reveals no gallop and no friction rub.   No murmur heard. Pulmonary/Chest: Effort normal and breath sounds normal. No respiratory distress. He has no wheezes. He has no rales.  Abdominal: Soft. He exhibits no distension. There is no tenderness.  Musculoskeletal: He exhibits no edema.  Defibrillator placed on the left side  Skin: Skin is warm and dry.     Assessment & Plan:   See encounters tab for problem based medical decision making.   Patient seen with Dr. Angelia Mould

## 2016-07-26 NOTE — Telephone Encounter (Signed)
APT. REMINDER CALL, LMTCB °

## 2016-07-27 ENCOUNTER — Encounter: Payer: Self-pay | Admitting: Internal Medicine

## 2016-07-27 ENCOUNTER — Ambulatory Visit (INDEPENDENT_AMBULATORY_CARE_PROVIDER_SITE_OTHER): Payer: Self-pay | Admitting: Internal Medicine

## 2016-07-27 DIAGNOSIS — Z95810 Presence of automatic (implantable) cardiac defibrillator: Secondary | ICD-10-CM

## 2016-07-27 DIAGNOSIS — Z79899 Other long term (current) drug therapy: Secondary | ICD-10-CM

## 2016-07-27 DIAGNOSIS — Z79891 Long term (current) use of opiate analgesic: Secondary | ICD-10-CM

## 2016-07-27 DIAGNOSIS — R0789 Other chest pain: Secondary | ICD-10-CM

## 2016-07-27 DIAGNOSIS — F17210 Nicotine dependence, cigarettes, uncomplicated: Secondary | ICD-10-CM

## 2016-07-27 DIAGNOSIS — I10 Essential (primary) hypertension: Secondary | ICD-10-CM

## 2016-07-27 DIAGNOSIS — F17200 Nicotine dependence, unspecified, uncomplicated: Secondary | ICD-10-CM

## 2016-07-27 DIAGNOSIS — I428 Other cardiomyopathies: Secondary | ICD-10-CM | POA: Insufficient documentation

## 2016-07-27 DIAGNOSIS — Z9189 Other specified personal risk factors, not elsewhere classified: Secondary | ICD-10-CM | POA: Insufficient documentation

## 2016-07-27 MED ORDER — OXYCODONE-ACETAMINOPHEN 5-325 MG PO TABS: ORAL_TABLET | ORAL | 0 refills | Status: DC

## 2016-07-27 MED ORDER — PRAVASTATIN SODIUM 20 MG PO TABS: 20.0000 mg | ORAL_TABLET | Freq: Every evening | ORAL | 5 refills | Status: DC

## 2016-07-27 NOTE — Assessment & Plan Note (Signed)
Assessment: Tobacco use Patient continues to smoke cigarettes. He states he smokes 5 cigarettes a day. I asked if he was ready to quit smoking and he stated that he was not. I let him know that when he is ready we could help him quit. He states that he would let us know when that time came.  Plan -reassess readiness to quit at next visit, in 3 months

## 2016-07-27 NOTE — Assessment & Plan Note (Addendum)
Assessment: Musculoskeletal chest pain Patient has moderate pain at his defibrillator site when he stays physically active. Patient is physically active around the house doing yard work and manual labor. He uses Percocet when he has moderate pain and receives 7 Percocet pills a month. He states he takes as needed for his pain and 7 pills a month is sufficient.  Plan - Percocet 7 pills a month. He has received a 3 month supply which will take him to the end of Feb.  He will not need a refill until February 30th, 2018

## 2016-07-27 NOTE — Patient Instructions (Signed)
[name] [name],  It was a pleasure seeing you again today. Please start taking pravastatin 20 mg daily. I will see you in 3 months

## 2016-07-27 NOTE — Assessment & Plan Note (Addendum)
Assessment: essential hypertension Patient is taking lisinopril 10 mg daily. Blood pressure is stable and at goal at 122/74  Plan -continue lisinopril 10mg  - CBC  - CMP

## 2016-07-27 NOTE — Assessment & Plan Note (Signed)
Assessment: ASCVD risk greater than 7.5% Patient denies any chest pain or shortness of breath. He states he feels that he is at his baseline health today and has no further complaints.  Calculating his 10 year ASCVD risk it is 10.7. We discussed benefits of starting a statin medication and patient agreed to taking pravastatin.  He did not want to take it daily and asked if he could take it every other day.   Plan -moderate intensity Pravastatin 20 mg every other day

## 2016-07-28 LAB — CBC
HEMOGLOBIN: 14.5 g/dL (ref 12.6–17.7)
Hematocrit: 44.8 % (ref 37.5–51.0)
MCH: 28.2 pg (ref 26.6–33.0)
MCHC: 32.4 g/dL (ref 31.5–35.7)
MCV: 87 fL (ref 79–97)
PLATELETS: 206 10*3/uL (ref 150–379)
RBC: 5.15 x10E6/uL (ref 4.14–5.80)
RDW: 14.1 % (ref 12.3–15.4)
WBC: 9.4 10*3/uL (ref 3.4–10.8)

## 2016-07-28 LAB — CMP14 + ANION GAP
A/G RATIO: 2 (ref 1.2–2.2)
ALK PHOS: 70 IU/L (ref 39–117)
ALT: 12 IU/L (ref 0–44)
AST: 18 IU/L (ref 0–40)
Albumin: 4.3 g/dL (ref 3.5–5.5)
Anion Gap: 13 mmol/L (ref 10.0–18.0)
BILIRUBIN TOTAL: 0.4 mg/dL (ref 0.0–1.2)
BUN/Creatinine Ratio: 9 (ref 9–20)
BUN: 8 mg/dL (ref 6–24)
CALCIUM: 9.1 mg/dL (ref 8.7–10.2)
CHLORIDE: 103 mmol/L (ref 96–106)
CO2: 25 mmol/L (ref 18–29)
Creatinine, Ser: 0.93 mg/dL (ref 0.76–1.27)
GFR calc Af Amer: 108 mL/min/{1.73_m2} (ref >59)
GFR, EST NON AFRICAN AMERICAN: 93 mL/min/{1.73_m2} (ref >59)
GLUCOSE: 81 mg/dL (ref 65–99)
Globulin, Total: 2.1 g/dL (ref 1.5–4.5)
POTASSIUM: 4.5 mmol/L (ref 3.5–5.2)
SODIUM: 141 mmol/L (ref 134–144)
Total Protein: 6.4 g/dL (ref 6.0–8.5)

## 2016-08-01 NOTE — Progress Notes (Signed)
Internal Medicine Clinic Attending  I saw and evaluated the patient.  I personally confirmed the key portions of the history and exam documented by Dr. Hoffman and I reviewed pertinent patient test results.  The assessment, diagnosis, and plan were formulated together and I agree with the documentation in the resident's note.      

## 2016-08-15 ENCOUNTER — Ambulatory Visit (INDEPENDENT_AMBULATORY_CARE_PROVIDER_SITE_OTHER): Payer: Self-pay | Admitting: *Deleted

## 2016-08-15 DIAGNOSIS — I469 Cardiac arrest, cause unspecified: Secondary | ICD-10-CM

## 2016-08-15 NOTE — Progress Notes (Signed)
Remote ICD transmission.   

## 2016-08-16 ENCOUNTER — Encounter: Payer: Self-pay | Admitting: Cardiology

## 2016-08-23 LAB — CUP PACEART REMOTE DEVICE CHECK
Battery Remaining Percentage: 83 %
Implantable Lead Implant Date: 20160603
MDC IDC LEAD LOCATION: 753858
MDC IDC LEAD MODEL: 3401
MDC IDC PG IMPLANT DT: 20160603
MDC IDC SESS DTM: 20171219155900
Pulse Gen Serial Number: 115885

## 2016-09-01 ENCOUNTER — Encounter: Payer: Self-pay | Admitting: Cardiology

## 2016-09-01 NOTE — Progress Notes (Signed)
Letter  

## 2016-09-23 IMAGING — CR DG CHEST 1V PORT
1 series · 1 of 1 positions shown · non-contrast
Comparison: 01/21/2015

CLINICAL DATA: Status post central line placement

EXAM:
PORTABLE CHEST - 1 VIEW

[AP]
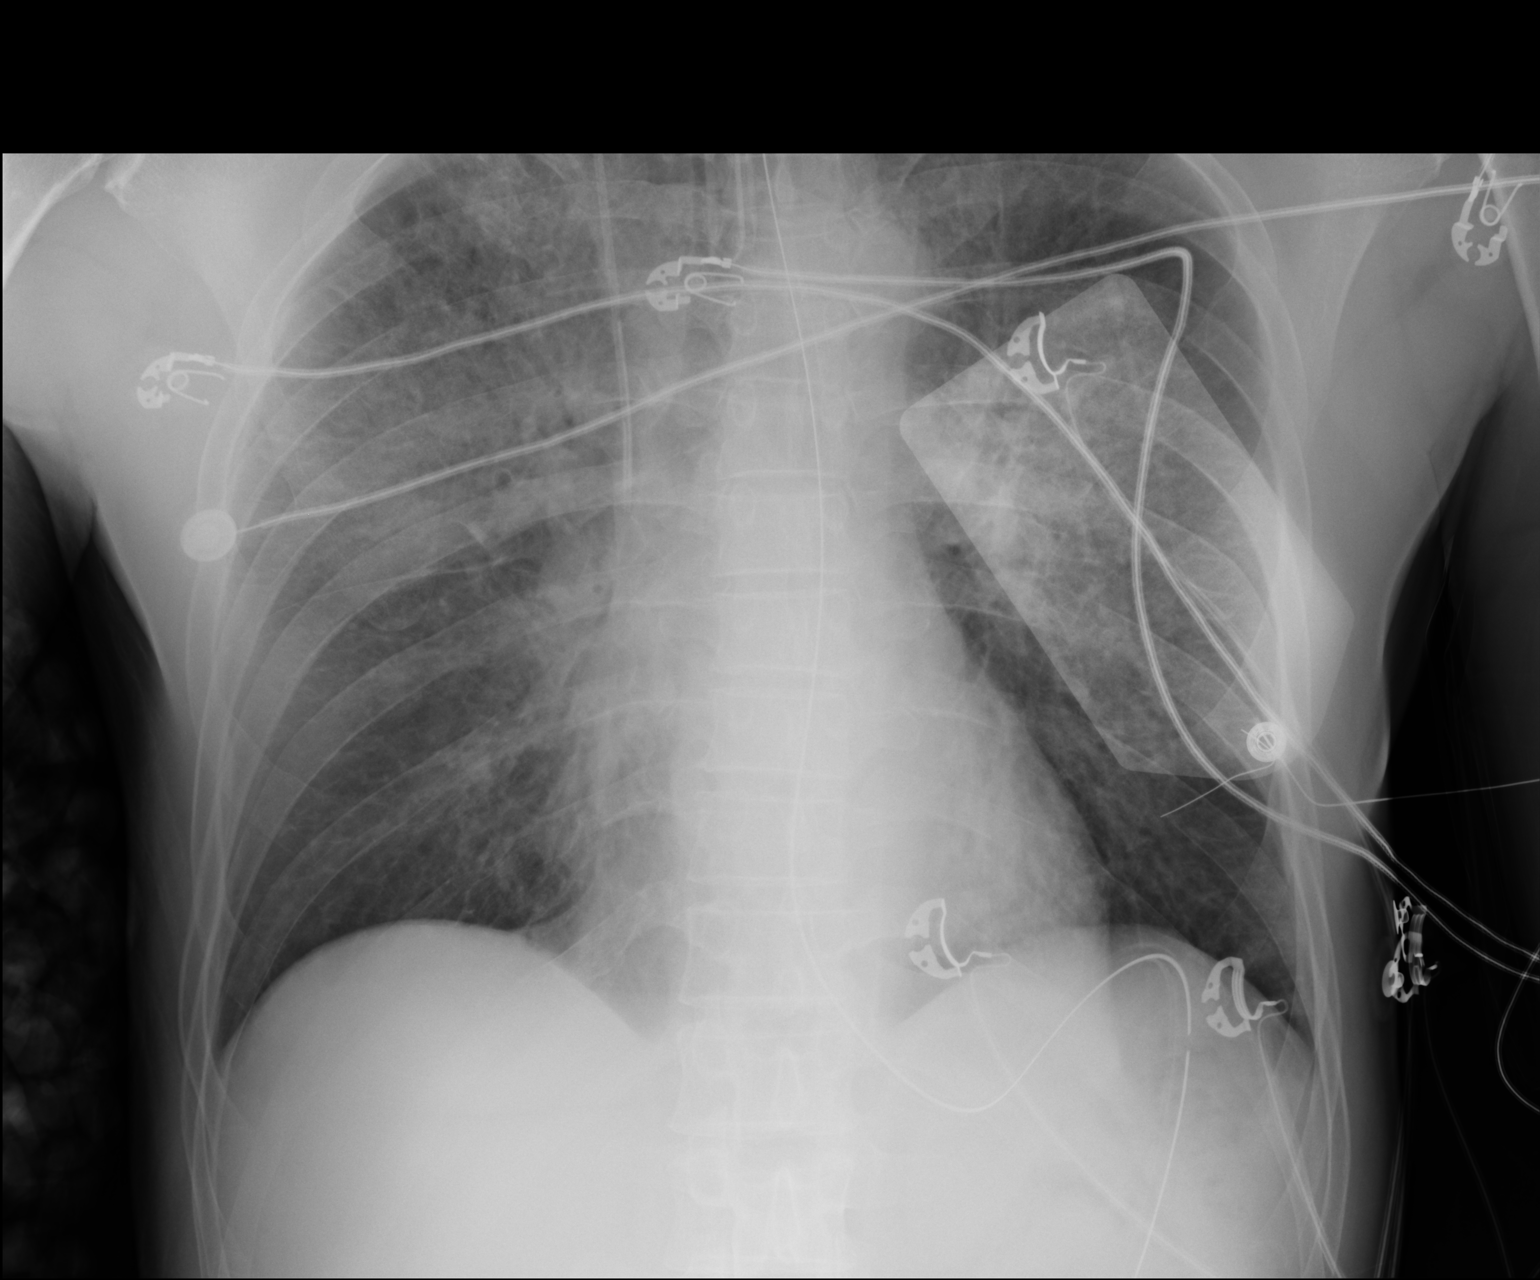

[1 of 1 positions shown; findings below may reference images not displayed]

FINDINGS: Cardiac shadow is stable. An endotracheal tube, nasogastric catheter
and right jugular central line are noted in satisfactory position.
No pneumothorax is identified. Persistent infiltrative changes are
noted within both lungs. No new focal abnormality is seen.
IMPRESSION: Status post right central line placement without evidence of
pneumothorax.

The remainder of the exam is stable.

## 2016-09-23 IMAGING — CT CT HEAD W/O CM
1 series · 15 of 30 positions shown, 19 images · non-contrast
Comparison: None.

ADDENDUM:
These results were called by telephone at the time of interpretation
on 01/21/2015 at [DATE] to Dr. Fransik Mena, who verbally
acknowledged these results.
CLINICAL DATA: Status post cardiac arrest

EXAM:
CT HEAD WITHOUT CONTRAST
TECHNIQUE: Contiguous axial images were obtained from the base of the skull
through the vertex without intravenous contrast.

[Series 2: head 5.0 h30s · axial · 0.44mm/px · z∈[-183,-33]mm · 15 of 34 slices shown, 19 images]
[im 2/34  brain]
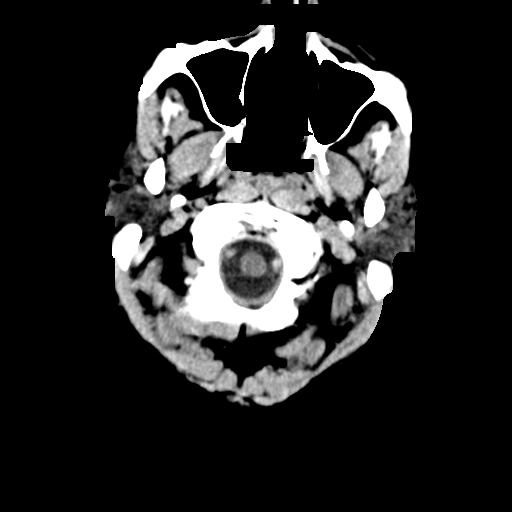
[im 2/34  bone]
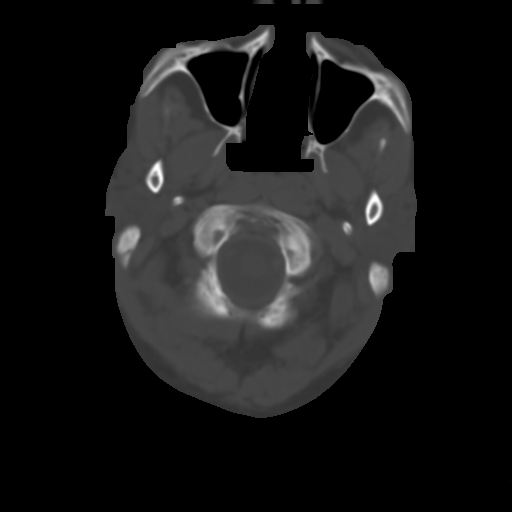
[im 4/34  brain]
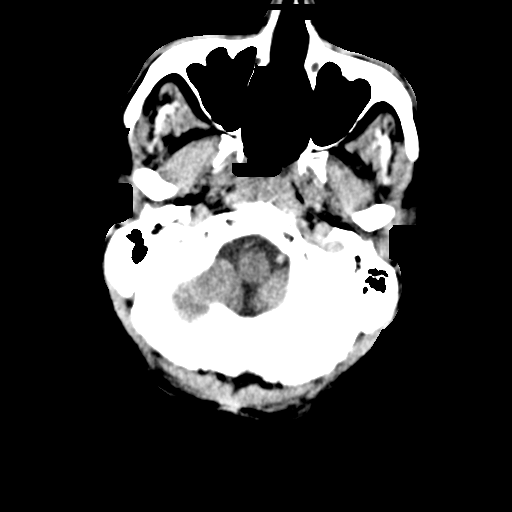
[im 6/34  brain]
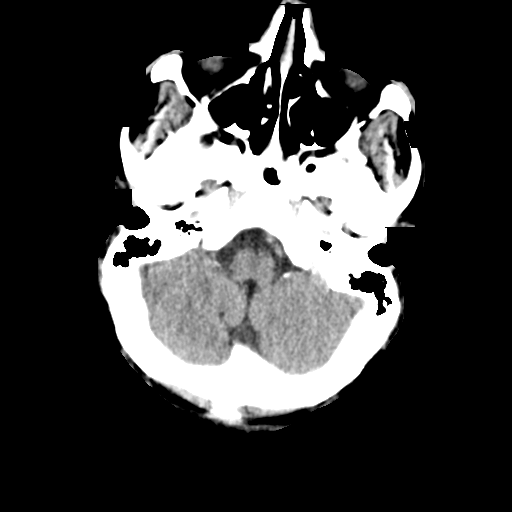
[im 8/34  brain]
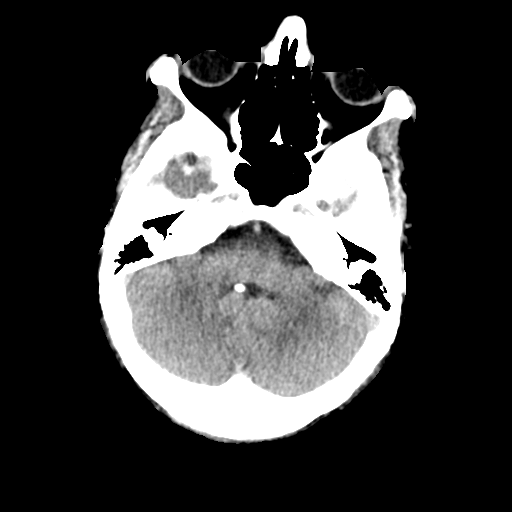
[im 11/34  brain]
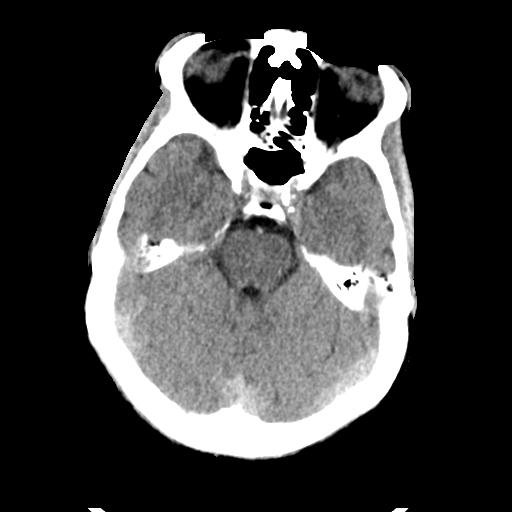
[im 11/34  bone]
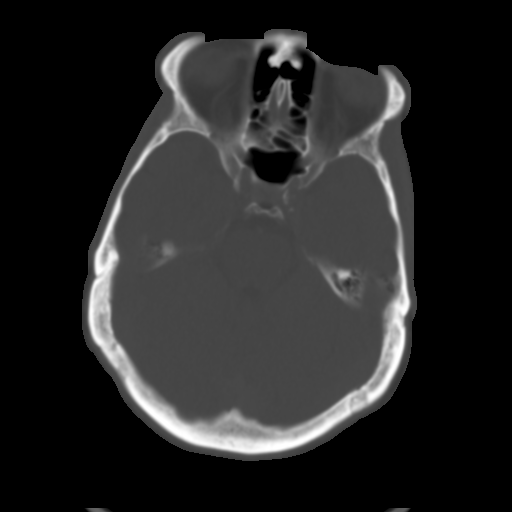
[im 13/34  brain]
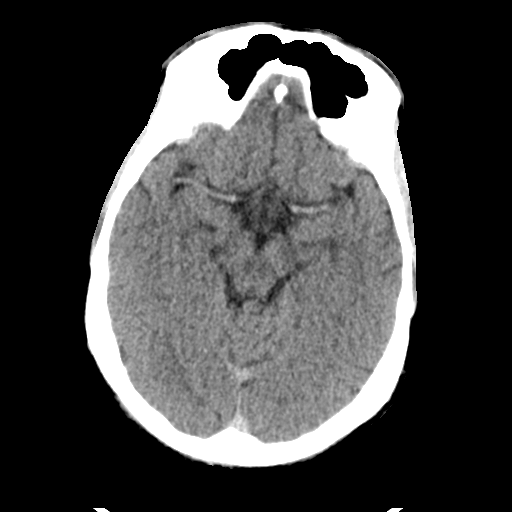
[im 15/34  brain]
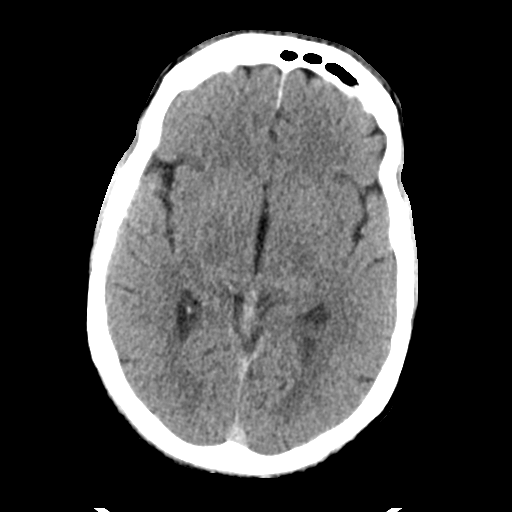
[im 18/34  brain]
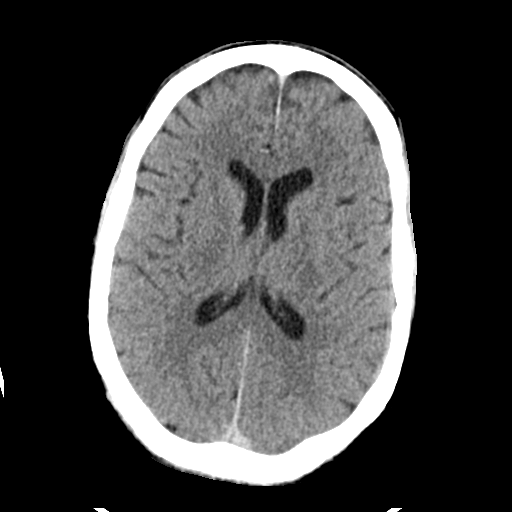
[im 19/34  brain]
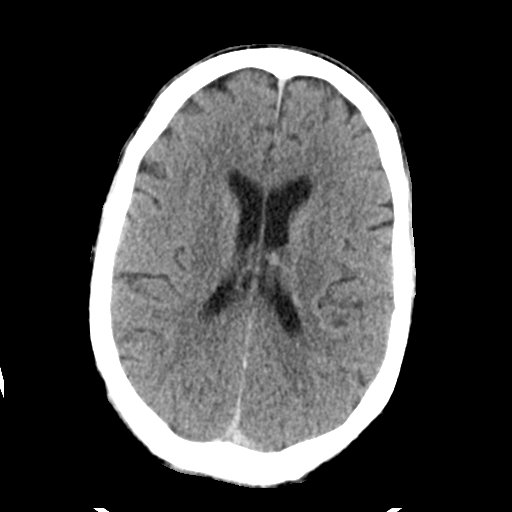
[im 19/34  bone]
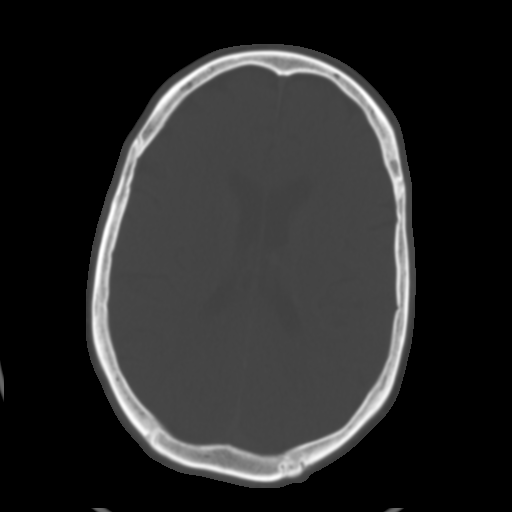
[im 21/34  brain]
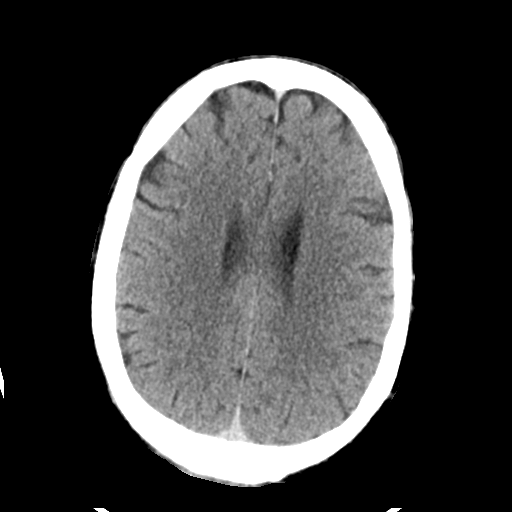
[im 23/34  brain]
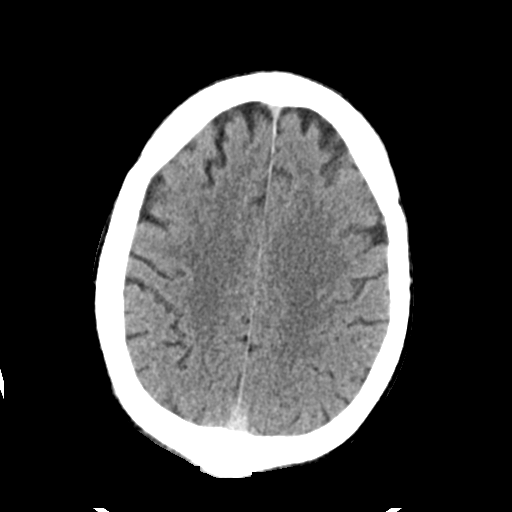
[im 26/34  brain]
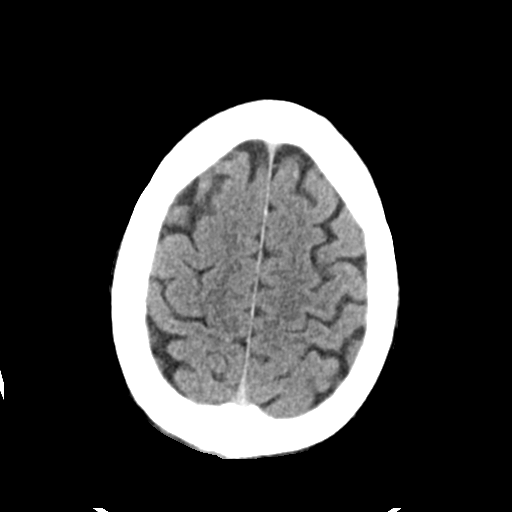
[im 28/34  brain]
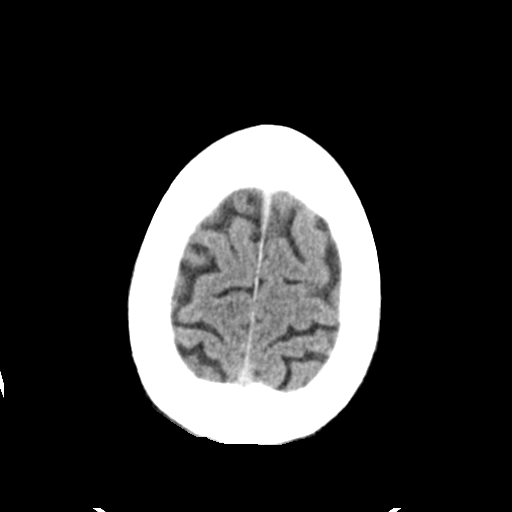
[im 28/34  bone]
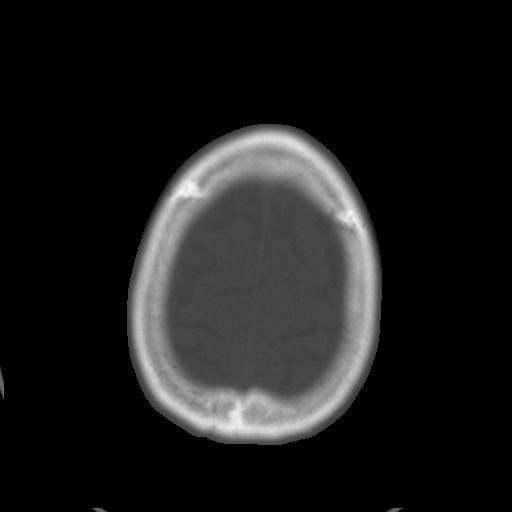
[im 30/34  brain]
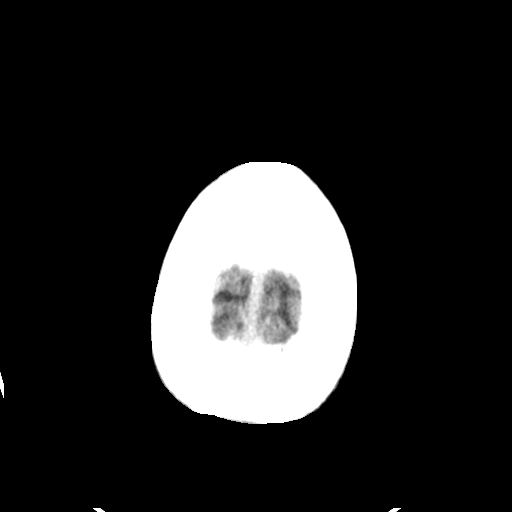
[im 32/34  brain]
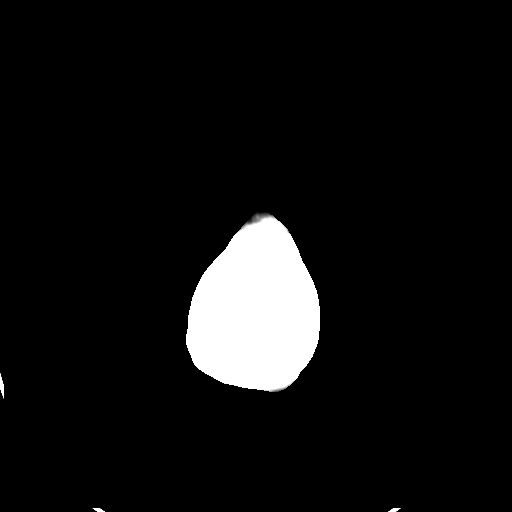

[15 of 30 positions shown; findings below may reference images not displayed]

FINDINGS: The ventricles are normal in size and configuration. There is no
intracranial mass, hemorrhage, extra-axial fluid collection, or
midline shift. No focal gray-white compartment lesions are
identified.

Both middle cerebral arteries show symmetric increased attenuation.
Bony calvarium appears intact. The mastoid air cells are clear.
IMPRESSION: Increased attenuation is noted in both middle cerebral arteries. The
significance of this symmetric finding is uncertain. Given the
history, the possibility of bilateral middle cerebral artery
thrombosis must be of concern. This finding may well warrant CT or
MR angiography to further assess. There is no intracranial edema.
Gray-white compartments appear normal. No acute hemorrhage evident.

Message left for Dr. Fransik Mena with respect to this report, [DATE]
[DATE], [DATE], [DATE] a.m..

## 2016-09-27 IMAGING — CR DG CHEST 1V PORT
1 series · 1 of 1 positions shown · non-contrast
Comparison: Single view of the chest 01/24/2015 and 01/22/2015.

CLINICAL DATA: Cough and chest pain today.

EXAM:
PORTABLE CHEST - 1 VIEW

[AP]
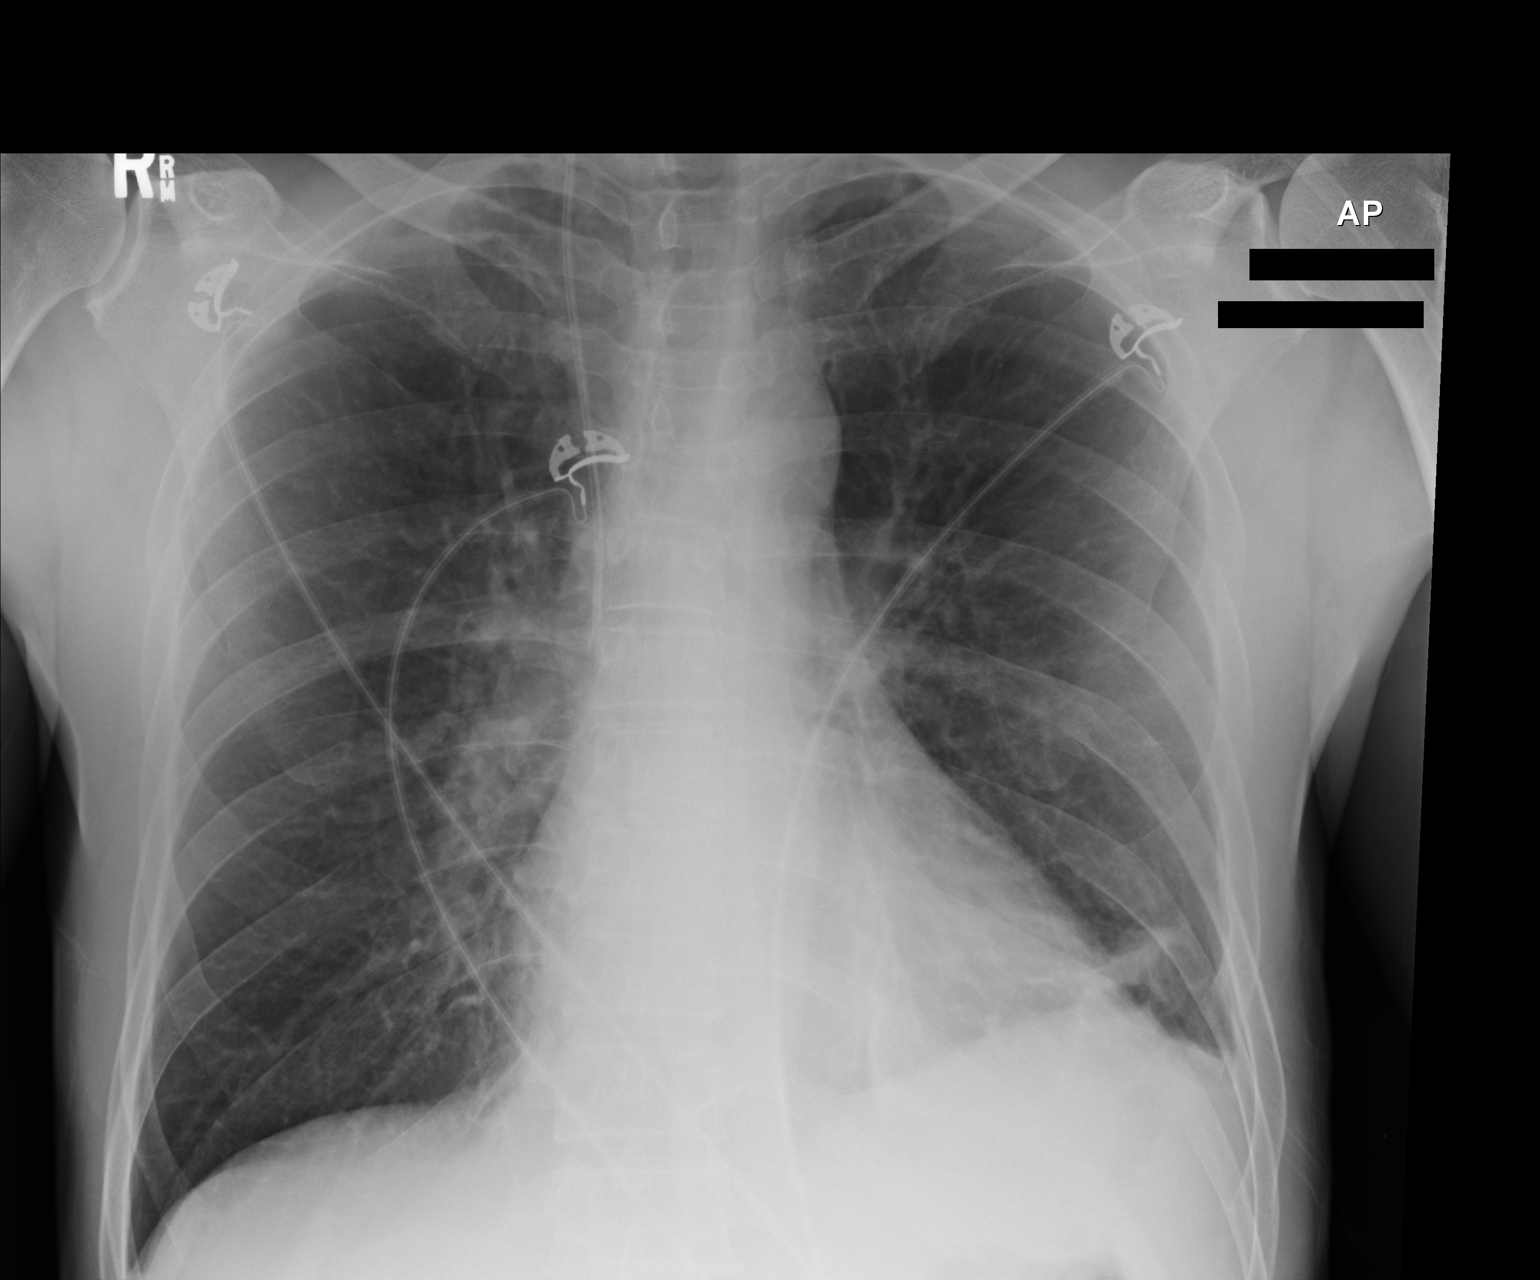

[1 of 1 positions shown; findings below may reference images not displayed]

FINDINGS: Right IJ catheter remains in place. There is a very small left
pleural effusion and mild subsegmental atelectasis in the left lung
base. The right lung is clear. No pneumothorax.
IMPRESSION: Small left pleural effusion and mild atelectasis in left lung base.

## 2016-11-01 ENCOUNTER — Emergency Department (HOSPITAL_COMMUNITY): Payer: Self-pay

## 2016-11-01 ENCOUNTER — Encounter (HOSPITAL_COMMUNITY): Payer: Self-pay | Admitting: *Deleted

## 2016-11-01 DIAGNOSIS — R0789 Other chest pain: Secondary | ICD-10-CM | POA: Insufficient documentation

## 2016-11-01 DIAGNOSIS — J111 Influenza due to unidentified influenza virus with other respiratory manifestations: Secondary | ICD-10-CM | POA: Insufficient documentation

## 2016-11-01 DIAGNOSIS — Z79899 Other long term (current) drug therapy: Secondary | ICD-10-CM | POA: Insufficient documentation

## 2016-11-01 DIAGNOSIS — F17210 Nicotine dependence, cigarettes, uncomplicated: Secondary | ICD-10-CM | POA: Insufficient documentation

## 2016-11-01 LAB — CBC
HCT: 46.3 % (ref 39.0–52.0)
Hemoglobin: 16.1 g/dL (ref 13.0–17.0)
MCH: 28.8 pg (ref 26.0–34.0)
MCHC: 34.8 g/dL (ref 30.0–36.0)
MCV: 82.7 fL (ref 78.0–100.0)
Platelets: 167 10*3/uL (ref 150–400)
RBC: 5.6 MIL/uL (ref 4.22–5.81)
RDW: 13.8 % (ref 11.5–15.5)
WBC: 9.4 10*3/uL (ref 4.0–10.5)

## 2016-11-01 LAB — BASIC METABOLIC PANEL
Anion gap: 10 (ref 5–15)
BUN: 11 mg/dL (ref 6–20)
CALCIUM: 9.1 mg/dL (ref 8.9–10.3)
CHLORIDE: 104 mmol/L (ref 101–111)
CO2: 23 mmol/L (ref 22–32)
CREATININE: 0.96 mg/dL (ref 0.61–1.24)
GFR calc Af Amer: >60 mL/min (ref >60)
GFR calc non Af Amer: >60 mL/min (ref >60)
GLUCOSE: 112 mg/dL — AB (ref 65–99)
Potassium: 3.8 mmol/L (ref 3.5–5.1)
Sodium: 137 mmol/L (ref 135–145)

## 2016-11-01 LAB — TROPONIN I

## 2016-11-01 NOTE — ED Triage Notes (Signed)
The pt is c/o chest pain for 3 days no chest pain now  He is just not feeling  right

## 2016-11-02 ENCOUNTER — Encounter: Payer: Self-pay | Admitting: Internal Medicine

## 2016-11-02 ENCOUNTER — Emergency Department (HOSPITAL_COMMUNITY)
Admission: EM | Admit: 2016-11-02 | Discharge: 2016-11-02 | Disposition: A | Discharge status: Home / Self Care | Payer: Self-pay | Attending: Emergency Medicine | Admitting: Emergency Medicine

## 2016-11-02 ENCOUNTER — Ambulatory Visit (INDEPENDENT_AMBULATORY_CARE_PROVIDER_SITE_OTHER): Payer: Self-pay | Admitting: Internal Medicine

## 2016-11-02 DIAGNOSIS — R0789 Other chest pain: Secondary | ICD-10-CM

## 2016-11-02 DIAGNOSIS — F17210 Nicotine dependence, cigarettes, uncomplicated: Secondary | ICD-10-CM

## 2016-11-02 DIAGNOSIS — R69 Illness, unspecified: Secondary | ICD-10-CM

## 2016-11-02 DIAGNOSIS — I10 Essential (primary) hypertension: Secondary | ICD-10-CM

## 2016-11-02 DIAGNOSIS — J111 Influenza due to unidentified influenza virus with other respiratory manifestations: Secondary | ICD-10-CM

## 2016-11-02 DIAGNOSIS — Z79891 Long term (current) use of opiate analgesic: Secondary | ICD-10-CM

## 2016-11-02 DIAGNOSIS — Z95810 Presence of automatic (implantable) cardiac defibrillator: Secondary | ICD-10-CM

## 2016-11-02 DIAGNOSIS — Z79899 Other long term (current) drug therapy: Secondary | ICD-10-CM

## 2016-11-02 LAB — HEPATIC FUNCTION PANEL
ALBUMIN: 3.7 g/dL (ref 3.5–5.0)
ALK PHOS: 58 U/L (ref 38–126)
ALT: 15 U/L — ABNORMAL LOW (ref 17–63)
AST: 23 U/L (ref 15–41)
BILIRUBIN INDIRECT: 0.5 mg/dL (ref 0.3–0.9)
Bilirubin, Direct: 0.1 mg/dL (ref 0.1–0.5)
TOTAL PROTEIN: 6.5 g/dL (ref 6.5–8.1)
Total Bilirubin: 0.6 mg/dL (ref 0.3–1.2)

## 2016-11-02 LAB — LIPASE, BLOOD: LIPASE: 19 U/L (ref 11–51)

## 2016-11-02 LAB — TROPONIN I

## 2016-11-02 MED ORDER — PANTOPRAZOLE SODIUM 40 MG PO TBEC
40.0000 mg | DELAYED_RELEASE_TABLET | Freq: Once | ORAL | Status: AC
  Administered 2016-11-02: 40 mg via ORAL
  Filled 2016-11-02: qty 1

## 2016-11-02 MED ORDER — OXYCODONE-ACETAMINOPHEN 5-325 MG PO TABS: ORAL_TABLET | ORAL | 0 refills | Status: AC

## 2016-11-02 MED ORDER — OXYCODONE-ACETAMINOPHEN 5-325 MG PO TABS: ORAL_TABLET | ORAL | 0 refills | Status: DC

## 2016-11-02 MED ORDER — GI COCKTAIL ~~LOC~~
30.0000 mL | Freq: Once | ORAL | Status: AC
  Administered 2016-11-02: 30 mL via ORAL
  Filled 2016-11-02: qty 30

## 2016-11-02 NOTE — Patient Instructions (Addendum)
Mr. Gravlin  It was a pleasure seeing you and your wife today. Please follow up in 6 months.  In the meantime please follow up in the [facility] for any acute issues.

## 2016-11-02 NOTE — Progress Notes (Signed)
   CC: Clinic follow-up for essential hypertension  HPI:  [name] is a 56 y.o. gentleman with history noted below that presents for follow-up on hypertension. He states his lisinopril 10 mg daily. He denies any dizziness or side effects from the medication.   He states he went to the ED yesterday for intermittent burning chest pain and elevated blood pressure.  Patient reports flulike symptoms for the past week and has taken nyquil for symptom relief. He states that he felt his blood pressure became elevated after taking the nyquil. He took his blood pressure at home and it was elevated in the 829F systolic.  Patient received a GI cocktail in the ED and states that his symptoms had improved. He reports being worked up for cardiac causes and was negative for MI. He continues to take half a pill to a pill of percocet 2 times a week for his chest wall pain. He states that the current dose has allowed him to do his daily activities and be more active around the house.     Past Medical History:  Diagnosis Date  . Anemia   . Cardiac arrest ([place]) 12/2014  . Depression    Resolved- pt contributes to previous drug use  . Hemorrhoids   . Hepatitis C    S/p completing treatment at [facility]  . IV drug abuse    Quit. Used in the 67s  . PTSD (post-traumatic stress disorder)    Found deceased brother- resolved per pt  . PULMONARY NODULE 12/21/2009   Stable 44mm per repeat CXR 4/11  . Substance abuse   . Tobacco abuse    1 ppd x 62 ys. Quit 04/22/12    Review of Systems:  Review of Systems  Constitutional: Negative for malaise/fatigue.  HENT: Positive for sore throat.   Respiratory: Negative for shortness of breath.   Cardiovascular: Negative for chest pain.  Gastrointestinal: Negative for abdominal pain.     Physical Exam:  Vitals:   11/02/16 1412  BP: 124/80  Pulse: 66  Temp: 97.8 F (36.6 C)  TempSrc: Oral  SpO2: 98%  Weight: 151 lb 1.6 oz (68.5 kg)   Physical Exam    Constitutional: He is well-developed, well-nourished, and in no distress.  Cardiovascular: Normal rate, regular rhythm and normal heart sounds.  Exam reveals no gallop and no friction rub.   No murmur heard. Pulmonary/Chest: Effort normal and breath sounds normal. No respiratory distress. He has no wheezes. He has no rales.  Musculoskeletal: He exhibits no edema.  Defibrillator on the left side  Skin: Skin is warm and dry.    Assessment & Plan:   See encounters tab for problem based medical decision making.   Patient discussed with Dr. Eppie Gibson

## 2016-11-02 NOTE — ED Notes (Signed)
Attempted to interrogate pacemaker, per [employer] rep new models do not interrogate b/c it is subq

## 2016-11-02 NOTE — Assessment & Plan Note (Signed)
Assessment: Essential hypertension Patient's blood pressure today was 124/80 controlled and at goal.  Plan: Continue lisinopril 10 mg daily Follow-up in 6 months

## 2016-11-02 NOTE — Discharge Instructions (Signed)
Please make a plan to follow up with your cardiologist.  Please avoid NSAIDs such as aspirin (Goody powders), ibuprofen (Motrin, Advil), naproxen (Aleve) as these may worsen your symptoms.  Tylenol 1000 mg every 6 hours is safe to take as long as you have no history of liver problems (heavy alcohol use, cirrhosis, hepatitis).  Please avoid spicy, acidic (citrus fruits, tomato based sauces, salsa), greasy, fatty foods.  Please avoid caffeine and alcohol.

## 2016-11-02 NOTE — ED Notes (Signed)
pts vitals were taken but was missed being documented

## 2016-11-02 NOTE — ED Notes (Signed)
IT sales professional to interrogate pacemaker

## 2016-11-02 NOTE — ED Notes (Signed)
The pt is c/o being here too  Long already  He thinks his chest pain is due to having the flu  And he is c/o not getting a flu swab done.  His symptoms do not sound like the flu  He will wait a  Little while longer he is cursing every breath

## 2016-11-02 NOTE — ED Provider Notes (Signed)
By signing my name below, I, Dyke Brackett, attest that this documentation has been prepared under the direction and in the presence of [place], DO . Electronically Signed: Dyke Brackett, Scribe. 11/02/2016. 1:28 AM.   TIME SEEN: 1:13 AM  CHIEF COMPLAINT:  Chief Complaint  Patient presents with  . Chest Pain    HPI: [name] [name] is a 56 y.o. male with a history of cardiac arrest and hepatitis C who presents to the Emergency Department complaining of intermittent, burning chest pain without radiation onset three days ago. Per pt, pain is exacerbated by eating and movement. He reports some mild pressure during exam. He reports associated productive cough, sore throat x2 days, decreased appetite today, generalized body aches, and lightheadedness this morning. Per pt, his blood pressure PTA was 190/100. Pt reports having "indigestion" today and feels as if he has the flu. He received his seasonal influenza vaccination this year. Pt states he used cocaine when he was 76, but denies any recent use. He states he did not have chest pain prior to episode of cardiac arrest. States prior to that episode he only had left shoulder pain. He had negative cardiac catheterization s/p cardiac arrest in May 2016 and denies any hx of CAD. Had an EF of 35%. His defibrillator has not fired. Pt has an appointment with internal medicine today at 2 PM. He denies any nausea, vomiting, diaphoresis, SOB, or any other associated symptoms.   ROS: See HPI Constitutional: no fever  Eyes: no drainage  ENT: no runny nose   Cardiovascular:  chest pain  Resp: no SOB  GI: no vomiting GU: no dysuria Integumentary: no rash  Allergy: no hives  Musculoskeletal: no leg swelling  Neurological: no slurred speech ROS otherwise negative  PAST MEDICAL HISTORY/PAST SURGICAL HISTORY:  Past Medical History:  Diagnosis Date  . Anemia   . Cardiac arrest ([place]) 12/2014  . Depression    Resolved- pt contributes to previous drug  use  . Hemorrhoids   . Hepatitis C    S/p completing treatment [facility]  . IV drug abuse    Quit. Used in the 28s  . PTSD (post-traumatic stress disorder)    Found deceased brother- resolved per pt  . PULMONARY NODULE 12/21/2009   Stable 30mm per repeat CXR 4/11  . Substance abuse   . Tobacco abuse    1 ppd x 48 ys. Quit 04/22/12    MEDICATIONS:  Prior to Admission medications   Medication Sig Start Date End Date Taking? Authorizing Provider  lisinopril (PRINIVIL,ZESTRIL) 10 MG tablet Take 1 tablet (10 mg total) by mouth daily. 03/10/16   Collier Salina, MD  Multiple Vitamin (MULTIVITAMIN WITH MINERALS) TABS tablet Take 1 tablet by mouth daily.    Historical Provider, MD  naproxen (NAPROSYN) 500 MG tablet Take 1 tablet (500 mg total) by mouth 2 (two) times daily with a meal. 03/10/16 03/10/17  Collier Salina, MD  nitroGLYCERIN (NITROSTAT) 0.4 MG SL tablet Place 1 tablet (0.4 mg total) under the tongue every 5 (five) minutes as needed for chest pain. 04/06/16   Deboraha Sprang, MD  oxyCODONE-acetaminophen (PERCOCET) 5-325 MG tablet Take 1/2 tab daily as needed for chest wall pain. 09/26/16   Jessica Ratliff Hoffman, DO  pravastatin (PRAVACHOL) 20 MG tablet Take 1 tablet (20 mg total) by mouth every evening. 07/27/16 07/27/17  Valinda Party, DO    ALLERGIES:  No Known Allergies  SOCIAL HISTORY:  Social History  Substance Use  Topics  . Smoking status: Current Every Day Smoker    Packs/day: 0.50    Years: 29.00    Types: Cigarettes  . Smokeless tobacco: Never Used     Comment: ABOUT 5 A DAY  . Alcohol use 3.6 oz/week    6 Cans of beer per week     Comment: occassional    FAMILY HISTORY: Family History  Problem Relation Age of Onset  . Hyperlipidemia Mother   . Hypertension Mother   . COPD Mother   . Colon polyps Mother 22  . Heart disease Father     Decease from MI at 27yo  . Cancer Maternal Grandfather     Colon cancer, deceased  . Alcohol abuse Maternal  Grandfather   . Colon cancer Maternal Grandfather 49  . Esophageal cancer Neg Hx   . Rectal cancer Neg Hx    EXAM: BP 177/92   Pulse (!) 57   Temp 98.4 F (36.9 C) (Oral)   Resp 19   SpO2 100%  CONSTITUTIONAL: Alert and oriented and responds appropriately to questions. Well-appearing; well-nourished HEAD: Normocephalic EYES: Conjunctivae clear, pupils appear equal, EOMI ENT: normal nose; moist mucous membranes NECK: Supple, no meningismus, no nuchal rigidity, no LAD  CARD: RRR; S1 and S2 appreciated; no murmurs, no clicks, no rubs, no gallops CHEST:  Chest wall is tender to palpation over the lower left area.  No crepitus, ecchymosis, erythema, warmth, rash or other lesions present.   RESP: Normal chest excursion without splinting or tachypnea; breath sounds clear and equal bilaterally; no wheezes, no rhonchi, no rales, no hypoxia or respiratory distress, speaking full sentences ABD/GI: Normal bowel sounds; non-distended; soft, non-tender, no rebound, no guarding, no peritoneal signs, no hepatosplenomegaly BACK:  The back appears normal and is non-tender to palpation, there is no CVA tenderness EXT: Normal ROM in all joints; non-tender to palpation; no edema; normal capillary refill; no cyanosis, no calf tenderness or swelling    SKIN: Normal color for age and race; warm; no rash NEURO: Moves all extremities equally PSYCH: The patient's mood and manner are appropriate. Grooming and personal hygiene are appropriate.  MEDICAL DECISION MAKING: Patient with intermittent chest pain that seems very atypical. Recently had a negative cardiac catheterization after an episode of cardiac arrest. States that he was told that they thought it could have been an arrhythmia that causes symptoms. He has not recently felt his defibrillator fire. We will interrogate his pacemaker/defibrillator. He does not appear volume overloaded today. Exam is benign. First troponin negative. Chest x-ray clear. Plan is to  check second troponin and treat his symptoms a GI cocktail, Protonix and reassess. He would like discharge home and I feel this is a reasonable plan if symptoms have improved and second troponin is negative.  ED PROGRESS: 2:55 AM  Pt's second troponin is negative. LFTs, lipase normal. He reports feeling better. We are unable to interrogate his device as it is a new or subcutaneous device and I do not feel emergently a representative needs to come to the emergency department. I feel he can be followed up by his cardiologist and he is comfortable with this plan. Discussed return precautions with patient and wife. They verbalize understanding.   At this time, I do not feel there is any life-threatening condition present. I have reviewed and discussed all results (EKG, imaging, lab, urine as appropriate) and exam findings with patient/family. I have reviewed nursing notes and appropriate previous records.  I feel the patient is safe to  be discharged home without further emergent workup and can continue workup as an outpatient as needed. Discussed usual and customary return precautions. Patient/family verbalize understanding and are comfortable with this plan.  Outpatient follow-up has been provided if needed. All questions have been answered.     EKG Interpretation  Date/Time:  Wednesday November 01 2016 20:43:59 EST Ventricular Rate:  55 PR Interval:  144 QRS Duration: 94 QT Interval:  398 QTC Calculation: 380 R Axis:   65 Text Interpretation:  Sinus bradycardia Possible Anterior infarct , age undetermined Abnormal ECG Confirmed by WARD,  DO, KRISTEN ([id number]) on 11/02/2016 1:12:19 AM       I personally performed the services described in this documentation, which was scribed in my presence. The recorded information has been reviewed and is accurate.    [place], DO 11/02/16 [phone]

## 2016-11-02 NOTE — Assessment & Plan Note (Signed)
Assessment: Chest wall pain He has chest wall pain due to a defibrillator that is placed.  Patient reports that his current 7 Percocet pills a month have allowed him to remain active and to his activities of daily living.   Plan: -At this visit gave a 3 month prescription of Percocet #7 -I have placed another 3 month prescription for the front desk for patient to pick up on Jan 25, 2017 and this should be covered through 04/19/2017.

## 2016-11-06 NOTE — Progress Notes (Signed)
Case discussed with Dr. Young Berry at the time of the visit. We reviewed the resident's history and exam and pertinent patient test results. I agree with the assessment, diagnosis, and plan of care documented in the resident's note.  Please note that with narcotic medications only 1 month with no refills can be written for.  Additional single month refills are on file for the patient to pick up each month.

## 2016-11-14 ENCOUNTER — Ambulatory Visit (INDEPENDENT_AMBULATORY_CARE_PROVIDER_SITE_OTHER): Payer: Self-pay | Admitting: *Deleted

## 2016-11-14 DIAGNOSIS — I469 Cardiac arrest, cause unspecified: Secondary | ICD-10-CM

## 2016-11-14 NOTE — Progress Notes (Signed)
Remote ICD transmission.   

## 2016-11-15 ENCOUNTER — Encounter: Payer: Self-pay | Admitting: Cardiology

## 2016-11-16 LAB — CUP PACEART REMOTE DEVICE CHECK
Battery Remaining Percentage: 80 %
Implantable Lead Model: 3401
MDC IDC LEAD IMPLANT DT: 20160603
MDC IDC LEAD LOCATION: 753858
MDC IDC PG IMPLANT DT: 20160603
MDC IDC PG SERIAL: 115885
MDC IDC SESS DTM: 20180320115000

## 2016-11-29 ENCOUNTER — Encounter: Payer: Self-pay | Admitting: Cardiology

## 2016-12-21 ENCOUNTER — Ambulatory Visit: Payer: Self-pay

## 2017-02-02 ENCOUNTER — Telehealth: Payer: Self-pay

## 2017-02-02 NOTE — Telephone Encounter (Signed)
Requesting oxycodone to be filled.  

## 2017-02-07 NOTE — Telephone Encounter (Signed)
Want to know if Rx is ready for pick up. Please call pt back. 

## 2017-02-16 ENCOUNTER — Encounter: Payer: Self-pay | Admitting: Internal Medicine

## 2017-03-06 ENCOUNTER — Encounter: Payer: Self-pay | Admitting: Internal Medicine

## 2017-03-06 ENCOUNTER — Ambulatory Visit (INDEPENDENT_AMBULATORY_CARE_PROVIDER_SITE_OTHER): Payer: No Typology Code available for payment source | Admitting: Internal Medicine

## 2017-03-06 VITALS — BP 110/72 | HR 72 | Ht 70.0 in | Wt 145.0 lb

## 2017-03-06 DIAGNOSIS — Z95810 Presence of automatic (implantable) cardiac defibrillator: Secondary | ICD-10-CM

## 2017-03-06 DIAGNOSIS — I428 Other cardiomyopathies: Secondary | ICD-10-CM

## 2017-03-06 DIAGNOSIS — I469 Cardiac arrest, cause unspecified: Secondary | ICD-10-CM

## 2017-03-06 LAB — CUP PACEART INCLINIC DEVICE CHECK
Date Time Interrogation Session: 20180710172034
Implantable Pulse Generator Implant Date: 20160603
MDC IDC LEAD IMPLANT DT: 20160603
MDC IDC LEAD LOCATION: 753858
Pulse Gen Serial Number: 115885

## 2017-03-06 NOTE — Patient Instructions (Signed)

## 2017-03-06 NOTE — Progress Notes (Signed)
Patient Care Team: Valinda Party, DO as PCP - General   HPI  [name] [name] is a 56 y.o. 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 Ejection fraction initially was 35% with subsequent normalization. Catheterization 5/16 demonstrated no obstructive coronary disease and normalization of LV function  Brugada-flecainide challenge was negative.  Signal average was negative.  MRI was mildly abnormal in that there was no structural heart disease but a small degree of gadolinium enhancement in the inferior wall   The patient denies chest pain, shortness of breath, nocturnal dyspnea, orthopnea or peripheral edema.  There have been no palpitations, lightheadedness or syncope.   Blood pressures were variable intensity in the emergency room in March with a blood pressure 190.    Records and Results Reviewed  Past Medical History:  Diagnosis Date  . Anemia   . Cardiac arrest ([place]) 12/2014  . Depression    Resolved- pt contributes to previous drug use  . Hemorrhoids   . Hepatitis C    S/p completing treatment at [facility]  . IV drug abuse    Quit. Used in the 24s  . PTSD (post-traumatic stress disorder)    Found deceased brother- resolved per pt  . PULMONARY NODULE 12/21/2009   Stable 97mm per repeat CXR 4/11  . Substance abuse   . Tobacco abuse    1 ppd x 89 ys. Quit 04/22/12    Past Surgical History:  Procedure Laterality Date  . CARDIAC CATHETERIZATION N/A 01/26/2015   Procedure: Left Heart Cath and Coronary Angiography;  Surgeon: Leonie Man, MD;  Location: [facility];  Service: Cardiovascular;  Laterality: N/A;  . CYST REMOVAL NECK  2011  . EP IMPLANTABLE DEVICE N/A 01/29/2015   Procedure: SubQ ICD Implant;  Surgeon: Deboraha Sprang, MD;  Location: [facility];  Service: Cardiovascular;  Laterality: N/A;  . FRACTURE SURGERY Left    In high school  . MULTIPLE  TOOTH EXTRACTIONS  1999  . WISDOM TOOTH EXTRACTION      Current Outpatient Prescriptions  Medication Sig Dispense Refill  . aspirin EC 81 MG tablet Take 81 mg by mouth daily.    Marland Kitchen lisinopril (PRINIVIL,ZESTRIL) 10 MG tablet Take 1 tablet (10 mg total) by mouth daily. 90 tablet 3  . Multiple Vitamin (MULTIVITAMIN WITH MINERALS) TABS tablet Take 1 tablet by mouth daily.    . nitroGLYCERIN (NITROSTAT) 0.4 MG SL tablet Place 1 tablet (0.4 mg total) under the tongue every 5 (five) minutes as needed for chest pain. 25 tablet 1  . oxyCODONE-acetaminophen (PERCOCET) 5-325 MG tablet Take 1/2 tab daily as needed for chest wall pain. 7 tablet 0   No current facility-administered medications for this visit.     No Known Allergies    Review of Systems negative except from HPI and PMH  Physical Exam BP 110/72   Pulse 72   Ht 5\' 10"  (1.778 m)   Wt 145 lb (65.8 kg)   SpO2 97%   BMI 20.81 kg/m  Well developed and well nourished in no acute distress HENT normal E scleral and icterus clear Neck Supple JVP flat; carotids brisk and full Clear to ausculation Device pocket well healed; without hematoma or erythema.  There is no tethering   Regular rate and rhythm, no murmurs gallops or rub Soft with active bowel sounds No clubbing cyanosis  Edema Alert and oriented, grossly normal  motor and sensory function Skin Warm and Dry  Assessment and  Plan   Aborted Cardiac Arrest  Hypertension   SICD  The patient's device was interrogated.  The information was reviewed. No changes were made in the programming.     Smoking  Continue current meds     Encouraged to stop smoking Will increase lisinopril and stop carvedilol with resolved cardiomypathy

## 2017-03-23 ENCOUNTER — Other Ambulatory Visit: Payer: Self-pay | Admitting: Internal Medicine

## 2017-05-10 ENCOUNTER — Ambulatory Visit (INDEPENDENT_AMBULATORY_CARE_PROVIDER_SITE_OTHER): Payer: No Typology Code available for payment source | Admitting: Internal Medicine

## 2017-05-10 DIAGNOSIS — Z79891 Long term (current) use of opiate analgesic: Secondary | ICD-10-CM

## 2017-05-10 DIAGNOSIS — I10 Essential (primary) hypertension: Secondary | ICD-10-CM

## 2017-05-10 DIAGNOSIS — X58XXXS Exposure to other specified factors, sequela: Secondary | ICD-10-CM

## 2017-05-10 DIAGNOSIS — Z23 Encounter for immunization: Secondary | ICD-10-CM

## 2017-05-10 DIAGNOSIS — S22000S Wedge compression fracture of unspecified thoracic vertebra, sequela: Secondary | ICD-10-CM

## 2017-05-10 DIAGNOSIS — R0789 Other chest pain: Secondary | ICD-10-CM

## 2017-05-10 DIAGNOSIS — F17210 Nicotine dependence, cigarettes, uncomplicated: Secondary | ICD-10-CM

## 2017-05-10 DIAGNOSIS — G4700 Insomnia, unspecified: Secondary | ICD-10-CM

## 2017-05-10 DIAGNOSIS — S229XXS Fracture of bony thorax, part unspecified, sequela: Secondary | ICD-10-CM

## 2017-05-10 DIAGNOSIS — Z8679 Personal history of other diseases of the circulatory system: Secondary | ICD-10-CM

## 2017-05-10 DIAGNOSIS — Z79899 Other long term (current) drug therapy: Secondary | ICD-10-CM

## 2017-05-10 DIAGNOSIS — Z95810 Presence of automatic (implantable) cardiac defibrillator: Secondary | ICD-10-CM

## 2017-05-10 MED ORDER — LISINOPRIL 10 MG PO TABS: 10.0000 mg | ORAL_TABLET | Freq: Every day | ORAL | 3 refills | Status: DC

## 2017-05-10 MED ORDER — OXYCODONE-ACETAMINOPHEN 5-325 MG PO TABS: 1.0000 | ORAL_TABLET | Freq: Three times a day (TID) | ORAL | 0 refills | Status: DC | PRN

## 2017-05-10 MED ORDER — MELATONIN 1 MG PO CAPS: 1.0000 | ORAL_CAPSULE | Freq: Every day | ORAL | 11 refills | Status: DC

## 2017-05-10 NOTE — Progress Notes (Signed)
   CC: Follow-up for essential hypertension  HPI:  Mr.Coral D Bojarski is a 56 y.o. male with history noted below that presents to the Internal Medicine Clinic for follow-up on essential hypertension. Please see problem based charting for the status of patient's chronic medical conditions.  Past Medical History:  Diagnosis Date  . Anemia   . Cardiac arrest ([place]) 12/2014  . Depression    Resolved- pt contributes to previous drug use  . Hemorrhoids   . Hepatitis C    S/p completing treatment at [facility]  . IV drug abuse    Quit. Used in the 57s  . PTSD (post-traumatic stress disorder)    Found deceased brother- resolved per pt  . PULMONARY NODULE 12/21/2009   Stable 18mm per repeat CXR 4/11  . Substance abuse   . Tobacco abuse    1 ppd x 51 ys. Quit 04/22/12    Review of Systems:  Review of Systems  Constitutional: Negative for weight loss.  Respiratory: Negative for shortness of breath.   Cardiovascular: Negative for chest pain and leg swelling.     Physical Exam:  Vitals:   05/10/17 1401  BP: 139/83  Pulse: 66  Temp: 98.3 F (36.8 C)  TempSrc: Oral  SpO2: 99%  Weight: 145 lb 3.2 oz (65.9 kg)   Physical Exam  Cardiovascular: Normal rate and regular rhythm.  Exam reveals no gallop and no friction rub.   No murmur heard. Pulmonary/Chest: Effort normal and breath sounds normal. No respiratory distress. He has no wheezes. He has no rales.  Musculoskeletal: He exhibits no edema.    Assessment & Plan:   See encounters tab for problem based medical decision making.    Patient discussed with Dr. Lynnae January

## 2017-05-10 NOTE — Patient Instructions (Addendum)
Mr. Bartel,  Please start taking melatonin at night time.  Please follow up in 6 months.  Please visit the acute care clinic for any acute medical needs.

## 2017-05-13 DIAGNOSIS — S22000S Wedge compression fracture of unspecified thoracic vertebra, sequela: Secondary | ICD-10-CM | POA: Insufficient documentation

## 2017-05-13 DIAGNOSIS — Z23 Encounter for immunization: Secondary | ICD-10-CM | POA: Insufficient documentation

## 2017-05-13 NOTE — Assessment & Plan Note (Signed)
Assessment:  Difficulty sleeping Has trouble falling and staying asleep, states that his mind races.  He reports drinking mountain dew throughout the day and watching television before going to bed.  He has used tylenol PM and xanax in the past.  He states xanax helps.  We discussed sleep hygiene  I offered ambien however, did not want to risk possible side effect of vivid dreams.  We discussed trazodone and melatonin.  He opted to start with melatonin to see if this would help with sleep.    Plan -melatonin - sleep hygiene discussed - will need to assess GAD-7 on next visit

## 2017-05-13 NOTE — Assessment & Plan Note (Signed)
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  Patient reports his 7 percocet pills a month allows him to do active work around the house.  Review of the [place] narcotic database was appropriate.    Plan - refill percocet, 3 paper prescriptions good for three months - 3 paper prescriptions left on file patient can pick up on Dec 12th and good thru March 13th.

## 2017-05-13 NOTE — Assessment & Plan Note (Signed)
Assessment:  Healthcare maintenance  Patient stated he wanted the flu vaccine  Plan -flu vaccine given in office

## 2017-05-13 NOTE — Assessment & Plan Note (Signed)
Assessment: Essential hypertension Patient takes lisinopril 10mg  daily.  Today's blood pressure is 139/83, stable and at goal.  Plan -Continue lisinopril 10mg  daily

## 2017-05-13 NOTE — Assessment & Plan Note (Signed)
Assessment:  Remote Upper thoracic compression fractures Patient had a chest x-ray on 02/01/2017 that showed remote upper thoracic compression fractions that are stable. Patient reports trauma up to 7 years ago when he fell off a ladder. When looking at previous x-rays they do not mention thoracic compression fractures. It is unknown what time these fractures happened as patient was unaware of these findings.  Patient denies neck pain. Patient has a 34-pack-year smoking history. Will get a DEXA scan to further assess for osteoporosis.  Plan -DEXA scan

## 2017-05-14 NOTE — Progress Notes (Signed)
Internal Medicine Clinic Attending  Case discussed with Dr. Hoffman at the time of the visit.  We reviewed the resident's history and exam and pertinent patient test results.  I agree with the assessment, diagnosis, and plan of care documented in the resident's note.  

## 2017-05-15 ENCOUNTER — Other Ambulatory Visit: Payer: Self-pay | Admitting: Internal Medicine

## 2017-05-15 ENCOUNTER — Telehealth: Payer: Self-pay | Admitting: *Deleted

## 2017-05-15 MED ORDER — MELATONIN 1 MG PO TABS: 1.0000 | ORAL_TABLET | Freq: Every day | ORAL | 11 refills | Status: DC

## 2017-05-15 NOTE — Telephone Encounter (Signed)
Fax from [facility] capsules not available, only tablets. Please change and send new rx. Thanks

## 2017-05-15 NOTE — Telephone Encounter (Signed)
New prescription sent, thanks

## 2017-06-05 ENCOUNTER — Encounter: Payer: No Typology Code available for payment source | Admitting: *Deleted

## 2017-06-05 ENCOUNTER — Telehealth: Payer: Self-pay | Admitting: Cardiology

## 2017-06-05 NOTE — Telephone Encounter (Signed)
Confirmed remote transmission w/ pt wife.   

## 2017-06-08 ENCOUNTER — Encounter: Payer: Self-pay | Admitting: Cardiology

## 2017-06-21 ENCOUNTER — Ambulatory Visit: Payer: No Typology Code available for payment source

## 2017-06-25 ENCOUNTER — Ambulatory Visit: Payer: No Typology Code available for payment source

## 2017-07-31 ENCOUNTER — Ambulatory Visit (INDEPENDENT_AMBULATORY_CARE_PROVIDER_SITE_OTHER): Payer: No Typology Code available for payment source | Admitting: *Deleted

## 2017-07-31 DIAGNOSIS — I469 Cardiac arrest, cause unspecified: Secondary | ICD-10-CM

## 2017-07-31 LAB — CUP PACEART REMOTE DEVICE CHECK
Battery Remaining Percentage: 73 %
Date Time Interrogation Session: 20181204125203
Implantable Lead Implant Date: 20160603
Implantable Lead Location: 753858
Implantable Lead Model: 3401
Implantable Pulse Generator Implant Date: 20160603
Pulse Gen Serial Number: 115885

## 2017-07-31 NOTE — Progress Notes (Signed)
Remote ICD transmission.   

## 2017-08-01 ENCOUNTER — Encounter: Payer: Self-pay | Admitting: Cardiology

## 2017-08-08 ENCOUNTER — Other Ambulatory Visit: Payer: Self-pay | Admitting: Internal Medicine

## 2017-08-08 NOTE — Telephone Encounter (Signed)
Refill Request   oxyCODONE-acetaminophen (PERCOCET/ROXICET) 5-325 MG tablet

## 2017-08-09 ENCOUNTER — Other Ambulatory Visit: Payer: Self-pay | Admitting: Internal Medicine

## 2017-08-14 NOTE — Telephone Encounter (Signed)
Dr Heber [place] wrote this on 9/13 Plan - refill percocet, 3 paper prescriptions good for three months - 3 paper prescriptions left on file patient can pick up on Dec 12th and good thru March 13th.

## 2017-08-14 NOTE — Telephone Encounter (Signed)
Rxs ready for pick up - pt called/ informed.

## 2017-08-14 NOTE — Telephone Encounter (Signed)
Want to know if oxyCODONE-acetaminophen (PERCOCET/ROXICET) 5-325 MG tablet, is ready for pick up. Please call pt back.

## 2017-10-25 ENCOUNTER — Encounter: Payer: No Typology Code available for payment source | Admitting: Internal Medicine

## 2017-10-30 ENCOUNTER — Ambulatory Visit (INDEPENDENT_AMBULATORY_CARE_PROVIDER_SITE_OTHER): Payer: No Typology Code available for payment source | Admitting: *Deleted

## 2017-10-30 ENCOUNTER — Telehealth: Payer: Self-pay | Admitting: Cardiology

## 2017-10-30 DIAGNOSIS — I469 Cardiac arrest, cause unspecified: Secondary | ICD-10-CM

## 2017-10-30 NOTE — Telephone Encounter (Signed)
Spoke with pt and reminded pt of remote transmission that is due today. Pt verbalized understanding.   

## 2017-10-31 ENCOUNTER — Encounter: Payer: Self-pay | Admitting: Cardiology

## 2017-10-31 NOTE — Progress Notes (Signed)
Remote ICD transmission.   

## 2017-11-01 LAB — CUP PACEART REMOTE DEVICE CHECK
Battery Remaining Percentage: 69 %
Implantable Lead Implant Date: 20160603
Implantable Pulse Generator Implant Date: 20160603
MDC IDC LEAD LOCATION: 753858
MDC IDC SESS DTM: 20190305234700
Pulse Gen Serial Number: 115885

## 2017-11-07 ENCOUNTER — Other Ambulatory Visit: Payer: Self-pay | Admitting: Internal Medicine

## 2017-11-07 MED ORDER — OXYCODONE-ACETAMINOPHEN 5-325 MG PO TABS: 1.0000 | ORAL_TABLET | Freq: Three times a day (TID) | ORAL | 0 refills | Status: DC | PRN

## 2017-11-07 NOTE — Telephone Encounter (Signed)
Calling requesting refill on pain medicine

## 2017-11-12 ENCOUNTER — Other Ambulatory Visit: Payer: Self-pay

## 2017-11-12 NOTE — Telephone Encounter (Signed)
oxyCODONE-acetaminophen (PERCOCET/ROXICET) 5-325 MG tablet      Would like to know if this Rx is ready. Please call pt back.

## 2017-11-12 NOTE — Telephone Encounter (Signed)
Verified with [facility] that patient did pick up oxycodone sent on 11/07/2017. Hubbard Hartshorn, RN, BSN

## 2017-11-27 ENCOUNTER — Telehealth: Payer: Self-pay | Admitting: Internal Medicine

## 2017-11-27 NOTE — Telephone Encounter (Signed)
CALLED PATIENT, LMTCB, TIME TO RENEW GCCN CARD °

## 2017-12-06 ENCOUNTER — Encounter: Payer: No Typology Code available for payment source | Admitting: Internal Medicine

## 2017-12-06 ENCOUNTER — Encounter: Payer: Self-pay | Admitting: Internal Medicine

## 2017-12-06 NOTE — Progress Notes (Deleted)
   CC: ***  HPI:  [name] is a 57 y.o.   Past Medical History:  Diagnosis Date  . Anemia   . Cardiac arrest ([place]) 12/2014  . Depression    Resolved- pt contributes to previous drug use  . Hemorrhoids   . Hepatitis C    S/p completing treatment at [facility]  . IV drug abuse    Quit. Used in the 19s  . PTSD (post-traumatic stress disorder)    Found deceased brother- resolved per pt  . PULMONARY NODULE 12/21/2009   Stable 78mm per repeat CXR 4/11  . Substance abuse   . Tobacco abuse    1 ppd x 84 ys. Quit 04/22/12    Review of Systems:  ***  Physical Exam:  There were no vitals filed for this visit. ***  Assessment & Plan:   See encounters tab for problem based medical decision making.  Assessment: Essential hypertension Patient takes lisinopril 10mg  daily.  Today's blood pressure is ***, stable and at goal.  Plan -Continue lisinopril 10mg  daily  Assessment: Chest wall pain He has chest wall pain due to a defibrillator that is placed.  Patient reports that his current 7 Percocet pills a month have allowed him to remain active and to his activities of daily living.   Plan:  Assessment:  Difficulty sleeping Last Clinic visit on 04/2017 patient reported difficulty falling asleep. We discussed sleep hygiene at that time. And recommended melatonin. Will do a GAD 6-7 today to assess of anxiety is playing a role.    Plan -melatonin - sleep hygiene discussed - will need to assess GAD-7 on next visit    Patient {GC/GE:[id number]::"discussed with","seen with"} Dr. {NAMES:[id number]::"Butcher","Granfortuna","E. Markham Dumlao","Klima","Mullen","Narendra","Vincent"}

## 2018-01-16 ENCOUNTER — Other Ambulatory Visit: Payer: Self-pay | Admitting: Internal Medicine

## 2018-01-16 NOTE — Telephone Encounter (Signed)
Patient wife requesting pt pain medicine, pls send to [facility] on [address]

## 2018-01-17 ENCOUNTER — Telehealth: Payer: Self-pay

## 2018-01-17 MED ORDER — OXYCODONE-ACETAMINOPHEN 5-325 MG PO TABS: 1.0000 | ORAL_TABLET | Freq: Three times a day (TID) | ORAL | 0 refills | Status: DC | PRN

## 2018-01-17 NOTE — Telephone Encounter (Signed)
Ruthann Cancer with [facility] pharmacy requesting diagosis code for the oxycodone. Please call back.

## 2018-01-17 NOTE — Telephone Encounter (Signed)
Rtc, gave diag code for thoracic compression fx

## 2018-01-24 ENCOUNTER — Encounter: Payer: No Typology Code available for payment source | Admitting: Internal Medicine

## 2018-01-29 ENCOUNTER — Telehealth: Payer: Self-pay | Admitting: Cardiology

## 2018-01-29 ENCOUNTER — Encounter: Payer: No Typology Code available for payment source | Admitting: *Deleted

## 2018-01-29 NOTE — Telephone Encounter (Signed)
Attempted to confirm remote transmission with pt. No answer and was unable to leave a message.   

## 2018-01-30 ENCOUNTER — Ambulatory Visit: Payer: No Typology Code available for payment source

## 2018-01-31 ENCOUNTER — Encounter: Payer: Self-pay | Admitting: Cardiology

## 2018-01-31 ENCOUNTER — Encounter: Payer: No Typology Code available for payment source | Admitting: Internal Medicine

## 2018-02-01 ENCOUNTER — Ambulatory Visit (INDEPENDENT_AMBULATORY_CARE_PROVIDER_SITE_OTHER): Payer: No Typology Code available for payment source | Admitting: *Deleted

## 2018-02-01 DIAGNOSIS — I469 Cardiac arrest, cause unspecified: Secondary | ICD-10-CM

## 2018-02-04 ENCOUNTER — Encounter: Payer: Self-pay | Admitting: Cardiology

## 2018-02-04 NOTE — Progress Notes (Signed)
Remote ICD transmission.   

## 2018-02-07 ENCOUNTER — Ambulatory Visit: Payer: No Typology Code available for payment source

## 2018-02-18 ENCOUNTER — Other Ambulatory Visit: Payer: Self-pay

## 2018-02-18 MED ORDER — OXYCODONE-ACETAMINOPHEN 5-325 MG PO TABS: 1.0000 | ORAL_TABLET | Freq: Three times a day (TID) | ORAL | 0 refills | Status: DC | PRN

## 2018-02-18 NOTE — Telephone Encounter (Signed)
Reviewed chart and [place] narcotic database.  Has received 7 tab for about 2 years per month.   Will refill X 1 until his PCP returns from vacation.

## 2018-02-18 NOTE — Telephone Encounter (Signed)
oxyCODONE-acetaminophen (PERCOCET/ROXICET) 5-325 MG tablet   Refill request @ [facility] on [address]

## 2018-03-20 LAB — CUP PACEART REMOTE DEVICE CHECK
Implantable Lead Location: 753862
Implantable Lead Model: 3401
MDC IDC LEAD IMPLANT DT: 20160603
MDC IDC PG IMPLANT DT: 20160603
MDC IDC SESS DTM: 20190724170215
Pulse Gen Serial Number: 115885

## 2018-03-27 NOTE — Progress Notes (Deleted)
   CC: ***  HPI:  [name] is a 57 y.o.   Past Medical History:  Diagnosis Date  . Anemia   . Cardiac arrest ([place]) 12/2014  . Depression    Resolved- pt contributes to previous drug use  . Hemorrhoids   . Hepatitis C    S/p completing treatment at [facility]  . IV drug abuse    Quit. Used in the 45s  . PTSD (post-traumatic stress disorder)    Found deceased brother- resolved per pt  . PULMONARY NODULE 12/21/2009   Stable 15mm per repeat CXR 4/11  . Substance abuse   . Tobacco abuse    1 ppd x 42 ys. Quit 04/22/12    Review of Systems:  ***  Physical Exam:  There were no vitals filed for this visit. ***  Assessment & Plan:   See encounters tab for problem based medical decision making.  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  Patient reports his 7 percocet pills a month allows him to do active work around the house.  Review of the [place] narcotic database was appropriate.   Plan -refill percocet prescription #7  Assessment: Essential hypertension Patient takes lisinopril 10mg  daily.  Today's blood pressure is ***, stable and at goal.  Plan -Continue lisinopril 10mg  daily     Patient {GC/GE:[id number]::"discussed with","seen with"} Dr. {NAMES:[id number]::"Butcher","Granfortuna","E. Phinneas Shakoor","Klima","Mullen","Narendra","Vincent"}

## 2018-04-02 ENCOUNTER — Encounter: Payer: No Typology Code available for payment source | Admitting: Internal Medicine

## 2018-04-02 ENCOUNTER — Encounter: Payer: Self-pay | Admitting: Internal Medicine

## 2018-04-04 ENCOUNTER — Encounter: Payer: No Typology Code available for payment source | Admitting: Internal Medicine

## 2018-04-24 ENCOUNTER — Telehealth: Payer: Self-pay | Admitting: *Deleted

## 2018-04-24 NOTE — Telephone Encounter (Signed)
Late entry:  Called patient d/t episode of noise recorded on 8/20 @0312 . Electrode impedance status is "OK". Patient states that he doesn't remember coming in contact with anything that may have caused the noise. I counseled patient about inappropriate shocks and things he should avoid. Patient verbalized understanding and appreciation of information.

## 2018-04-26 ENCOUNTER — Other Ambulatory Visit: Payer: Self-pay | Admitting: Internal Medicine

## 2018-05-06 ENCOUNTER — Ambulatory Visit (INDEPENDENT_AMBULATORY_CARE_PROVIDER_SITE_OTHER): Payer: No Typology Code available for payment source | Admitting: *Deleted

## 2018-05-06 DIAGNOSIS — I469 Cardiac arrest, cause unspecified: Secondary | ICD-10-CM

## 2018-05-06 NOTE — Progress Notes (Signed)
Remote ICD transmission.   

## 2018-05-07 ENCOUNTER — Encounter: Payer: Self-pay | Admitting: Cardiology

## 2018-05-16 ENCOUNTER — Ambulatory Visit: Payer: No Typology Code available for payment source

## 2018-05-16 ENCOUNTER — Encounter: Payer: Self-pay | Admitting: Internal Medicine

## 2018-05-23 ENCOUNTER — Telehealth: Payer: Self-pay | Admitting: Internal Medicine

## 2018-05-23 ENCOUNTER — Other Ambulatory Visit: Payer: Self-pay | Admitting: Internal Medicine

## 2018-05-23 ENCOUNTER — Other Ambulatory Visit: Payer: Self-pay

## 2018-05-23 ENCOUNTER — Ambulatory Visit (INDEPENDENT_AMBULATORY_CARE_PROVIDER_SITE_OTHER): Payer: Self-pay | Admitting: Internal Medicine

## 2018-05-23 ENCOUNTER — Encounter: Payer: Self-pay | Admitting: Internal Medicine

## 2018-05-23 DIAGNOSIS — R0789 Other chest pain: Secondary | ICD-10-CM

## 2018-05-23 DIAGNOSIS — R454 Irritability and anger: Secondary | ICD-10-CM

## 2018-05-23 DIAGNOSIS — I10 Essential (primary) hypertension: Secondary | ICD-10-CM

## 2018-05-23 DIAGNOSIS — G8929 Other chronic pain: Secondary | ICD-10-CM

## 2018-05-23 DIAGNOSIS — Z23 Encounter for immunization: Secondary | ICD-10-CM

## 2018-05-23 DIAGNOSIS — Z8674 Personal history of sudden cardiac arrest: Secondary | ICD-10-CM

## 2018-05-23 DIAGNOSIS — Z79899 Other long term (current) drug therapy: Secondary | ICD-10-CM

## 2018-05-23 DIAGNOSIS — G4700 Insomnia, unspecified: Secondary | ICD-10-CM

## 2018-05-23 DIAGNOSIS — F419 Anxiety disorder, unspecified: Secondary | ICD-10-CM

## 2018-05-23 DIAGNOSIS — Z79891 Long term (current) use of opiate analgesic: Secondary | ICD-10-CM

## 2018-05-23 DIAGNOSIS — Z95810 Presence of automatic (implantable) cardiac defibrillator: Secondary | ICD-10-CM

## 2018-05-23 MED ORDER — OXYCODONE-ACETAMINOPHEN 5-325 MG PO TABS: 1.0000 | ORAL_TABLET | Freq: Three times a day (TID) | ORAL | 0 refills | Status: DC | PRN

## 2018-05-23 MED ORDER — LORAZEPAM 1 MG PO TABS: 1.0000 mg | ORAL_TABLET | Freq: Two times a day (BID) | ORAL | 0 refills | Status: DC

## 2018-05-23 MED ORDER — SERTRALINE HCL 25 MG PO TABS: 25.0000 mg | ORAL_TABLET | Freq: Every day | ORAL | 2 refills | Status: DC

## 2018-05-23 NOTE — Patient Instructions (Signed)
Mr. Butner,  It was a pleasure seeing you today.  Please follow up in 3 months.

## 2018-05-23 NOTE — Telephone Encounter (Signed)
Pt just left but on the AVS paperwork they seen where the medicine is going to [facility] on [address] pt uses the [facility] on [address], pls send medicine to [place]; pt contact [phone]

## 2018-05-23 NOTE — Progress Notes (Signed)
   CC: Follow-up on essential hypertension  HPI:  Mr.Kyllian D Rone is a 57 y.o. male with history noted below that presents to the internal medicine clinic for follow-up on essential hypertension.  Please see problem based charting for the status of patient's chronic medical conditions.  Past Medical History:  Diagnosis Date  . Anemia   . Cardiac arrest ([place]) 12/2014  . Depression    Resolved- pt contributes to previous drug use  . Hemorrhoids   . Hepatitis C    S/p completing treatment at [facility]  . IV drug abuse ([place])    Quit. Used in the 51s  . PTSD (post-traumatic stress disorder)    Found deceased brother- resolved per pt  . PULMONARY NODULE 12/21/2009   Stable 43mm per repeat CXR 4/11  . Substance abuse ([place])   . Tobacco abuse    1 ppd x 67 ys. Quit 04/22/12    Review of Systems:  Review of Systems  Respiratory: Negative for shortness of breath.   Cardiovascular: Negative for chest pain.  Psychiatric/Behavioral: The patient is nervous/anxious and has insomnia.      Physical Exam:  Vitals:   05/23/18 1335 05/23/18 1349  BP: (!) 134/94 (!) 142/78  Pulse: 76 69  Temp: 98.2 F (36.8 C)   TempSrc: Oral   SpO2: 98%   Weight: 144 lb 14.4 oz (65.7 kg)    Physical Exam  Constitutional: He is well-developed, well-nourished, and in no distress.  Cardiovascular: Normal rate, regular rhythm and normal heart sounds. Exam reveals no gallop and no friction rub.  No murmur heard. Pulmonary/Chest: Effort normal and breath sounds normal. No respiratory distress. He has no wheezes. He has no rales.     Assessment & Plan:   See encounters tab for problem based medical decision making.    Patient discussed with Dr. Beryle Beams

## 2018-05-24 ENCOUNTER — Other Ambulatory Visit: Payer: Self-pay | Admitting: Internal Medicine

## 2018-05-24 ENCOUNTER — Telehealth: Payer: Self-pay

## 2018-05-24 MED ORDER — LORAZEPAM 1 MG PO TABS: 1.0000 mg | ORAL_TABLET | Freq: Two times a day (BID) | ORAL | 0 refills | Status: DC

## 2018-05-24 MED ORDER — SERTRALINE HCL 25 MG PO TABS: 25.0000 mg | ORAL_TABLET | Freq: Every day | ORAL | 2 refills | Status: DC

## 2018-05-24 MED ORDER — OXYCODONE-ACETAMINOPHEN 5-325 MG PO TABS: 1.0000 | ORAL_TABLET | Freq: Three times a day (TID) | ORAL | 0 refills | Status: DC | PRN

## 2018-05-24 NOTE — Telephone Encounter (Signed)
I sent it in 

## 2018-05-24 NOTE — Telephone Encounter (Signed)
Thank you :)

## 2018-05-24 NOTE — Telephone Encounter (Signed)
I have now sent dr Heber Annabella a text message to resend scripts, pt is angry

## 2018-05-24 NOTE — Telephone Encounter (Signed)
Called and confirmed rec'd scripts at [facility], called pt and informed spouse they would be ready in 1 hr

## 2018-05-24 NOTE — Telephone Encounter (Signed)
Spoke to dr Heber Asher, she is out of hosp, could you please resend these scripts?

## 2018-05-24 NOTE — Telephone Encounter (Signed)
LORazepam (ATIVAN) 1 MG tablet    oxyCODONE-acetaminophen (PERCOCET/ROXICET) 5-325 MG tablet   sertraline (ZOLOFT) 25 MG tablet, needs to be @   [place], [address] (Phone) [phone] (Fax)   Pt wife states the Rx is at the wrong pharmacy. Please call pt wife back.

## 2018-05-24 NOTE — Telephone Encounter (Signed)
Pt was very is threatening the physicians and staff, cussing and yelling. Pt is very upset about medicine

## 2018-05-25 LAB — CUP PACEART REMOTE DEVICE CHECK
Battery Remaining Percentage: 63 %
Date Time Interrogation Session: 20190909123600
Implantable Lead Implant Date: 20160603
Implantable Pulse Generator Implant Date: 20160603
MDC IDC LEAD LOCATION: 753862
Pulse Gen Serial Number: 115885

## 2018-05-26 DIAGNOSIS — F419 Anxiety disorder, unspecified: Secondary | ICD-10-CM | POA: Insufficient documentation

## 2018-05-26 NOTE — Assessment & Plan Note (Signed)
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   Patient usually uses 7 Percocet tablets a month.  Patient states that he is often running out after about a week and a half.  Discussed doing 15 tablets for the month and patient was agreeable.  Plan -percocet #15 per month
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
## 2018-05-26 NOTE — Assessment & Plan Note (Signed)
Assessment: Essential hypertension Patient takes lisinopril 10mg  daily.  Today's blood pressure is 134/94, stable and at goal.  Plan -Continue lisinopril 10mg  daily

## 2018-05-27 NOTE — Progress Notes (Signed)
Medicine attending: Medical history, presenting problems, physical findings, and medications, reviewed with resident physician Dr Jessica Hoffman on the day of the patient visit and I concur with her evaluation and management plan. 

## 2018-06-17 ENCOUNTER — Other Ambulatory Visit: Payer: Self-pay | Admitting: *Deleted

## 2018-06-23 MED ORDER — OXYCODONE-ACETAMINOPHEN 5-325 MG PO TABS: 1.0000 | ORAL_TABLET | Freq: Three times a day (TID) | ORAL | 0 refills | Status: DC | PRN

## 2018-06-23 MED ORDER — LORAZEPAM 1 MG PO TABS: 1.0000 mg | ORAL_TABLET | Freq: Two times a day (BID) | ORAL | 0 refills | Status: DC

## 2018-07-19 ENCOUNTER — Other Ambulatory Visit: Payer: Self-pay | Admitting: Internal Medicine

## 2018-07-19 NOTE — Telephone Encounter (Signed)
Needs refills on   oxyCODONE-acetaminophen (PERCOCET/ROXICET) 5-325 MG tablet LORazepam (ATIVAN) 1 MG tablet at [place]       ;pt contact [phone]

## 2018-07-19 NOTE — Telephone Encounter (Signed)
Last  Percocet rx written 10/27 Last OV 05/23/18. Next OV 12/19 with PCP. UDS 10/15/15.

## 2018-07-22 MED ORDER — OXYCODONE-ACETAMINOPHEN 5-325 MG PO TABS: 1.0000 | ORAL_TABLET | Freq: Three times a day (TID) | ORAL | 0 refills | Status: DC | PRN

## 2018-07-22 MED ORDER — LORAZEPAM 1 MG PO TABS: 1.0000 mg | ORAL_TABLET | Freq: Two times a day (BID) | ORAL | 0 refills | Status: DC

## 2018-08-05 ENCOUNTER — Ambulatory Visit (INDEPENDENT_AMBULATORY_CARE_PROVIDER_SITE_OTHER): Payer: Self-pay

## 2018-08-05 DIAGNOSIS — I469 Cardiac arrest, cause unspecified: Secondary | ICD-10-CM

## 2018-08-09 NOTE — Progress Notes (Signed)
Remote ICD transmission.   

## 2018-08-15 ENCOUNTER — Other Ambulatory Visit: Payer: Self-pay

## 2018-08-15 ENCOUNTER — Encounter: Payer: Self-pay | Admitting: Internal Medicine

## 2018-08-15 ENCOUNTER — Ambulatory Visit (INDEPENDENT_AMBULATORY_CARE_PROVIDER_SITE_OTHER): Payer: Self-pay | Admitting: Internal Medicine

## 2018-08-15 DIAGNOSIS — I10 Essential (primary) hypertension: Secondary | ICD-10-CM

## 2018-08-15 DIAGNOSIS — R0789 Other chest pain: Secondary | ICD-10-CM

## 2018-08-15 DIAGNOSIS — F419 Anxiety disorder, unspecified: Secondary | ICD-10-CM

## 2018-08-15 DIAGNOSIS — Z79891 Long term (current) use of opiate analgesic: Secondary | ICD-10-CM

## 2018-08-15 DIAGNOSIS — Z79899 Other long term (current) drug therapy: Secondary | ICD-10-CM

## 2018-08-15 DIAGNOSIS — R451 Restlessness and agitation: Secondary | ICD-10-CM

## 2018-08-15 DIAGNOSIS — Z23 Encounter for immunization: Secondary | ICD-10-CM

## 2018-08-15 MED ORDER — OXYCODONE-ACETAMINOPHEN 5-325 MG PO TABS: 1.0000 | ORAL_TABLET | Freq: Three times a day (TID) | ORAL | 0 refills | Status: DC | PRN

## 2018-08-15 MED ORDER — LISINOPRIL 10 MG PO TABS: 10.0000 mg | ORAL_TABLET | Freq: Every day | ORAL | 1 refills | Status: DC

## 2018-08-15 NOTE — Progress Notes (Signed)
   CC:  Follow-up on essential hypertension  HPI:  [name] is a 57 y.o. male with history noted below that presents to the internal medicine clinic for follow-up on essential hypertension. Please see problem based charting for the status of patient's chronic medical conditions.  Past Medical History:  Diagnosis Date  . Anemia   . Cardiac arrest ([place]) 12/2014  . Depression    Resolved- pt contributes to previous drug use  . Hemorrhoids   . Hepatitis C    S/p completing treatment at [facility]  . IV drug abuse ([place])    Quit. Used in the 42s  . PTSD (post-traumatic stress disorder)    Found deceased brother- resolved per pt  . PULMONARY NODULE 12/21/2009   Stable 49mm per repeat CXR 4/11  . Substance abuse ([place])   . Tobacco abuse    1 ppd x 72 ys. Quit 04/22/12    Review of Systems:  Review of Systems  Respiratory: Negative for shortness of breath.   Cardiovascular: Negative for chest pain.  Psychiatric/Behavioral: Negative for hallucinations and suicidal ideas. The patient is nervous/anxious.      Physical Exam:  Vitals:   08/15/18 1323  BP: 122/82  Pulse: 77  Temp: 97.8 F (36.6 C)  TempSrc: Oral  SpO2: 97%  Weight: 148 lb 9.6 oz (67.4 kg)  Height: 5\' 10"  (1.778 m)   Physical Exam  Constitutional: He is well-developed, well-nourished, and in no distress.  Cardiovascular: Normal rate, regular rhythm and normal heart sounds. Exam reveals no gallop and no friction rub.  No murmur heard. Pulmonary/Chest: Effort normal and breath sounds normal. No respiratory distress. He has no wheezes. He has no rales.     Assessment & Plan:   See encounters tab for problem based medical decision making.   Patient seen with Dr. Evette Doffing

## 2018-08-16 NOTE — Assessment & Plan Note (Signed)
Assessment: Musculoskeletal chest pain Patient has Chest wall pain due to defibrillator.  Currently takes Percocet 15 tablets a month as needed.  States this helps with his pain when doing his activities of daily living.  [place] narcotic database reviewed and appropriate.  Plan -refill percocet #15 monthly
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
## 2018-08-16 NOTE — Assessment & Plan Note (Signed)
Assessment: Essential hypertension Patient reports adherence to lisinopril 10 mg daily. Blood pressure today is 122/82 and well controlled.  Plan -Continue lisinopril 10 mg daily

## 2018-08-16 NOTE — Addendum Note (Signed)
Addended by: Lalla Brothers T on: 08/16/2018 04:30 PM   Modules accepted: Level of Service

## 2018-09-19 ENCOUNTER — Other Ambulatory Visit: Payer: Self-pay

## 2018-09-19 LAB — CUP PACEART REMOTE DEVICE CHECK
Battery Remaining Percentage: 61 %
Date Time Interrogation Session: 20191209130100
Implantable Lead Implant Date: 20160603
Implantable Lead Location: 753862
Implantable Lead Model: 3401
Implantable Pulse Generator Implant Date: 20160603
Pulse Gen Serial Number: 115885

## 2018-09-19 NOTE — Telephone Encounter (Signed)
oxyCODONE-acetaminophen (PERCOCET/ROXICET) 5-325 MG tablet   Elta Guadeloupe as Reviewed   Refill request @  [place], [place] [phone] (Phone) [phone] (Fax)

## 2018-09-20 MED ORDER — OXYCODONE-ACETAMINOPHEN 5-325 MG PO TABS: 1.0000 | ORAL_TABLET | Freq: Three times a day (TID) | ORAL | 0 refills | Status: DC | PRN

## 2018-10-08 ENCOUNTER — Ambulatory Visit (INDEPENDENT_AMBULATORY_CARE_PROVIDER_SITE_OTHER): Payer: Self-pay | Admitting: Internal Medicine

## 2018-10-08 ENCOUNTER — Encounter: Payer: Self-pay | Admitting: Internal Medicine

## 2018-10-08 ENCOUNTER — Other Ambulatory Visit: Payer: Self-pay

## 2018-10-08 DIAGNOSIS — J101 Influenza due to other identified influenza virus with other respiratory manifestations: Secondary | ICD-10-CM

## 2018-10-08 DIAGNOSIS — R6889 Other general symptoms and signs: Secondary | ICD-10-CM

## 2018-10-08 LAB — INFLUENZA PANEL BY PCR (TYPE A & B)
Influenza A By PCR: POSITIVE — AB
Influenza B By PCR: NEGATIVE

## 2018-10-08 MED ORDER — OSELTAMIVIR PHOSPHATE 75 MG PO CAPS: 75.0000 mg | ORAL_CAPSULE | Freq: Two times a day (BID) | ORAL | 0 refills | Status: AC

## 2018-10-08 NOTE — Patient Instructions (Signed)
Mr. Boccio,  I am sorry to hear you are not feeling well. I will call you with the results of your flu test later today.  If you have any questions or concerns, call our clinic at [phone] or after hours call [phone] and ask for the internal medicine resident on call. Thank you!  Dr. Philipp Ovens

## 2018-10-08 NOTE — Assessment & Plan Note (Addendum)
Patient is here with 1 day of flu-like symptoms.  He endorses fevers, chills, muscle aches, congestion and cough.  Denies nausea, vomiting, and diarrhea.  No known sick contacts, however he did attend a children's concert a couple of days ago.  He did receive his flu shot this year. -Follow-up influenza PCR  ADDENDUM: Positive for flu A. Called patient with results, sent prescription for tamiflu to his pharmacy.

## 2018-10-08 NOTE — Addendum Note (Signed)
Addended by: Jodean Lima on: 10/08/2018 03:09 PM   Modules accepted: Orders

## 2018-10-08 NOTE — Progress Notes (Signed)
   CC: Flu like symptoms  HPI:  [name] is a 58 y.o. male with past medical history outlined below here for flu like symptoms. For the details of today's visit, please refer to the assessment and plan.  Past Medical History:  Diagnosis Date  . Anemia   . Cardiac arrest ([place]) 12/2014  . Depression    Resolved- pt contributes to previous drug use  . Hemorrhoids   . Hepatitis C    S/p completing treatment at [facility]  . IV drug abuse ([place])    Quit. Used in the 58s  . PTSD (post-traumatic stress disorder)    Found deceased brother- resolved per pt  . PULMONARY NODULE 12/21/2009   Stable 48mm per repeat CXR 4/11  . Substance abuse ([place])   . Tobacco abuse    1 ppd x 17 ys. Quit 04/22/12    Review of Systems  Constitutional: Positive for chills and fever.  HENT: Positive for congestion.   Respiratory: Positive for cough.   Musculoskeletal: Positive for myalgias.    Physical Exam:  Vitals:   10/08/18 1408  BP: 124/68  Pulse: 70  Temp: 98.6 F (37 C)  TempSrc: Oral  SpO2: 99%  Weight: 150 lb 11.2 oz (68.4 kg)  Height: 5\' 10"  (1.778 m)    Constitutional: NAD, appears comfortable Cardiovascular: RRR, no m/r/g Pulmonary/Chest: CTAB, no wheezes, rales, or rhonchi.  Extremities: Warm and well perfused. No edema.  Psychiatric: Normal mood and affect  Assessment & Plan:   See Encounters Tab for problem based charting.  Patient discussed with Dr. Evette Doffing

## 2018-10-09 NOTE — Progress Notes (Signed)
Internal Medicine Clinic Attending  Case discussed with Dr. Guilloud at the time of the visit.  We reviewed the resident's history and exam and pertinent patient test results.  I agree with the assessment, diagnosis, and plan of care documented in the resident's note.  

## 2018-10-22 ENCOUNTER — Other Ambulatory Visit: Payer: Self-pay | Admitting: Internal Medicine

## 2018-10-22 MED ORDER — OXYCODONE-ACETAMINOPHEN 5-325 MG PO TABS: 1.0000 | ORAL_TABLET | Freq: Three times a day (TID) | ORAL | 0 refills | Status: DC | PRN

## 2018-10-22 NOTE — Telephone Encounter (Signed)
Refill request oxyCODONE-acetaminophen (PERCOCET/ROXICET) 5-325 MG tablet AT  [place], [place] [phone] (Phone) [phone] (Fax

## 2018-11-04 ENCOUNTER — Encounter: Payer: Self-pay | Admitting: *Deleted

## 2018-11-05 ENCOUNTER — Telehealth: Payer: Self-pay

## 2018-11-05 NOTE — Telephone Encounter (Signed)
Unable to leave a message for patient to remind of missed remote transmission.  

## 2018-11-06 ENCOUNTER — Encounter: Payer: Self-pay | Admitting: Internal Medicine

## 2018-11-06 ENCOUNTER — Ambulatory Visit: Payer: Self-pay

## 2018-11-13 ENCOUNTER — Ambulatory Visit: Payer: Self-pay

## 2018-11-15 ENCOUNTER — Encounter: Payer: Self-pay | Admitting: Cardiology

## 2018-11-20 ENCOUNTER — Other Ambulatory Visit: Payer: Self-pay | Admitting: Internal Medicine

## 2018-11-20 MED ORDER — OXYCODONE-ACETAMINOPHEN 5-325 MG PO TABS: 1.0000 | ORAL_TABLET | Freq: Three times a day (TID) | ORAL | 0 refills | Status: DC | PRN

## 2018-11-20 NOTE — Telephone Encounter (Signed)
Needs refill on   oxyCODONE-acetaminophen (PERCOCET/ROXICET) 5-325 MG tablet  [place], [place]   ;pt contact [phone]

## 2018-11-21 ENCOUNTER — Encounter: Payer: Self-pay | Admitting: Internal Medicine

## 2018-12-24 ENCOUNTER — Other Ambulatory Visit: Payer: Self-pay

## 2018-12-24 MED ORDER — OXYCODONE-ACETAMINOPHEN 5-325 MG PO TABS: 1.0000 | ORAL_TABLET | Freq: Three times a day (TID) | ORAL | 0 refills | Status: DC | PRN

## 2018-12-24 NOTE — Telephone Encounter (Signed)
oxyCODONE-acetaminophen (PERCOCET/ROXICET) 5-325 MG tablet, refill request @  [place], [place] [phone] (Phone) [phone] (Fax)  Pt is also requesting to speak with a nurse about meds.

## 2019-01-09 ENCOUNTER — Ambulatory Visit (INDEPENDENT_AMBULATORY_CARE_PROVIDER_SITE_OTHER): Payer: Self-pay | Admitting: Internal Medicine

## 2019-01-09 ENCOUNTER — Other Ambulatory Visit: Payer: Self-pay

## 2019-01-09 VITALS — BP 146/85 | HR 60 | Temp 98.3°F | Wt 151.4 lb

## 2019-01-09 DIAGNOSIS — I10 Essential (primary) hypertension: Secondary | ICD-10-CM

## 2019-01-09 DIAGNOSIS — Z79899 Other long term (current) drug therapy: Secondary | ICD-10-CM

## 2019-01-09 DIAGNOSIS — Z8619 Personal history of other infectious and parasitic diseases: Secondary | ICD-10-CM

## 2019-01-09 NOTE — Patient Instructions (Signed)
Thank you for coming to the clinic today. It was a pleasure to see you.   I will call you with the results of your test  Please call the internal medicine center clinic if you have any questions or concerns, we may be able to help and keep you from a long and expensive emergency room wait. Our clinic and after hours phone number is [phone], the best time to call is Monday through Friday 9 am to 4 pm but there is always someone available 24/7 if you have an emergency. If you need medication refills please notify your pharmacy one week in advance and they will send [place] a request.

## 2019-01-09 NOTE — Assessment & Plan Note (Signed)
BP Readings from Last 3 Encounters:  01/09/19 (!) 146/85  10/08/18 124/68  08/15/18 122/82  Review of prior blood pressure readings shows consistent control, todays reading seems to be an outlier. - continue lisinopril
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
## 2019-01-10 LAB — RPR: RPR Ser Ql: NONREACTIVE

## 2019-01-10 LAB — HIV ANTIBODY (ROUTINE TESTING W REFLEX): HIV Screen 4th Generation wRfx: NONREACTIVE

## 2019-01-13 NOTE — Progress Notes (Addendum)
Internal Medicine Clinic Attending  Case discussed with Dr. Blum at the time of the visit.  We reviewed the resident's history and exam and pertinent patient test results.  I agree with the assessment, diagnosis, and plan of care documented in the resident's note. 

## 2019-01-16 ENCOUNTER — Other Ambulatory Visit: Payer: Self-pay

## 2019-01-16 NOTE — Telephone Encounter (Signed)
oxyCODONE-acetaminophen (PERCOCET/ROXICET) 5-325 MG tablet, refill request @  [place], [place] [phone] (Phone) [phone] (Fax)

## 2019-01-16 NOTE — Telephone Encounter (Signed)
Last rx written  12/24/18. Last OV 08/15/18 with Dr Heber [place]; 5/14 with Dr Hetty Ely. UDS 10/15/15.

## 2019-01-19 LAB — HCV RNA QUANT: Hepatitis C Quantitation: NOT DETECTED IU/mL

## 2019-01-21 MED ORDER — OXYCODONE-ACETAMINOPHEN 5-325 MG PO TABS: 1.0000 | ORAL_TABLET | Freq: Three times a day (TID) | ORAL | 0 refills | Status: DC | PRN

## 2019-02-17 ENCOUNTER — Other Ambulatory Visit: Payer: Self-pay | Admitting: Internal Medicine

## 2019-02-17 MED ORDER — OXYCODONE-ACETAMINOPHEN 5-325 MG PO TABS: 1.0000 | ORAL_TABLET | Freq: Three times a day (TID) | ORAL | 0 refills | Status: DC | PRN

## 2019-02-17 NOTE — Telephone Encounter (Signed)
Refill Request   oxyCODONE-acetaminophen (PERCOCET/ROXICET) 5-325 MG tablet   [facility] - [place], [place] - [address]

## 2019-02-24 ENCOUNTER — Encounter: Payer: Self-pay | Admitting: *Deleted

## 2019-03-18 ENCOUNTER — Other Ambulatory Visit: Payer: Self-pay

## 2019-03-18 NOTE — Telephone Encounter (Signed)
oxyCODONE-acetaminophen (PERCOCET/ROXICET) 5-325 MG tablet   REFILL REQUEST @  [place], [place] (Phone) [phone] (Fax)

## 2019-03-19 NOTE — Telephone Encounter (Signed)
Last rx written 01/2214 tabs Last OV  5/14. Next OV no f/u appt. UDS 10/15/15

## 2019-03-20 MED ORDER — OXYCODONE-ACETAMINOPHEN 5-325 MG PO TABS: 1.0000 | ORAL_TABLET | Freq: Three times a day (TID) | ORAL | 0 refills | Status: DC | PRN

## 2019-04-24 ENCOUNTER — Other Ambulatory Visit: Payer: Self-pay | Admitting: Internal Medicine

## 2019-04-24 NOTE — Telephone Encounter (Signed)
Needs refill on   oxyCODONE-acetaminophen (PERCOCET/ROXICET) 5-325 MG tablet      ;pt contact [place], [place]

## 2019-04-24 NOTE — Telephone Encounter (Signed)
Called pt - his wife, Lynelle Smoke, answered-  stated he continues to c/o pain. Dr Gilford Rile does not have any available appts.  Last rx written 03/20/19. Last OV  01/09/19. Next OV 05/08/19 in [facility]. UDS  10/15/15.

## 2019-05-01 MED ORDER — OXYCODONE-ACETAMINOPHEN 5-325 MG PO TABS: 1.0000 | ORAL_TABLET | Freq: Three times a day (TID) | ORAL | 0 refills | Status: DC | PRN

## 2019-05-08 ENCOUNTER — Ambulatory Visit (INDEPENDENT_AMBULATORY_CARE_PROVIDER_SITE_OTHER): Payer: Self-pay | Admitting: Internal Medicine

## 2019-05-08 ENCOUNTER — Other Ambulatory Visit: Payer: Self-pay

## 2019-05-08 DIAGNOSIS — Z79891 Long term (current) use of opiate analgesic: Secondary | ICD-10-CM

## 2019-05-08 DIAGNOSIS — Z8674 Personal history of sudden cardiac arrest: Secondary | ICD-10-CM

## 2019-05-08 DIAGNOSIS — F419 Anxiety disorder, unspecified: Secondary | ICD-10-CM

## 2019-05-08 DIAGNOSIS — R0789 Other chest pain: Secondary | ICD-10-CM

## 2019-05-08 DIAGNOSIS — Z87311 Personal history of (healed) other pathological fracture: Secondary | ICD-10-CM

## 2019-05-08 DIAGNOSIS — Z95810 Presence of automatic (implantable) cardiac defibrillator: Secondary | ICD-10-CM

## 2019-05-08 NOTE — Progress Notes (Signed)
Internal Medicine Clinic Attending  Case discussed with Dr. Chundi at the time of the visit.  We reviewed the resident's history and exam and pertinent patient test results.  I agree with the assessment, diagnosis, and plan of care documented in the resident's note. 

## 2019-05-08 NOTE — Assessment & Plan Note (Addendum)
Patient had a unexplained vfib arrest in 2016 for 45 minutes for which he underwent cpr and ICD placement at that time. He states that after that time he has chest pain that occasionally radiates to the back. He describes the pain as 6/10 intensity, present over the sternum, sharp/dull in nature, is unable to sleep due to it. Patient gets #15 percocet's per month which help alleviate the pain. He states that he takes 1/2 a pill nightly to make the medication last all month. Review of pdmp showed that this history was accurate.   Review of emr shows that he has remote upper thoracic compression fracture per xray on March 2018.  Assessment and plan  Patient's percocet's have already been filled by Dr. Rebeca Alert on 05/01/19. Will call pharmacy to verify why it has not been dispensed yet.   -patient needs to follow up for inpatient visit with pcp to follow up regarding this pain. Sent message to front desk  -if patient continues to need opiates, he will need a pain contract

## 2019-05-08 NOTE — Assessment & Plan Note (Addendum)
Patient states that he has been very anxious and dreaming about his brother. He feels that he needs a psychiatrist.   -will discuss with pcp at next inpatient visit that is scheduled

## 2019-05-08 NOTE — Progress Notes (Signed)
  [facility] Telephone Encounter Continuity Care Appointment  HPI:   This telephone encounter was created for [name] [name] on 05/08/2019 for the following purpose/cc of medication refill.   Past Medical History:  Past Medical History:  Diagnosis Date  . Anemia   . Cardiac arrest ([place]) 12/2014  . Depression    Resolved- pt contributes to previous drug use  . Hemorrhoids   . Hepatitis C    S/p completing treatment at [facility]  . 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 Used in the 68s  . PTSD (post-traumatic stress disorder)    Found deceased brother- resolved per pt  . PULMONARY NODULE 12/21/2009   Stable 66mm per repeat CXR 4/11  . Substance abuse ([place])   . Tobacco abuse    1 ppd x 23 ys. Quit 04/22/12      ROS:   Anxious, chest pain, decreased sleep   Assessment / Plan / Recommendations:   Please see A&P under problem oriented charting for assessment of the patient's acute and chronic medical conditions.   As always, pt is advised that if symptoms worsen or new symptoms arise, they should go to an urgent care facility or to to ER for further evaluation.   Consent and Medical Decision Making:   Patient discussed with Dr. Dareen Piano  This is a telephone encounter between [name] [name] and Stace Peace on 05/08/2019 for medication refill of percocet. The visit was conducted with the patient located at home and Banjamin Stovall at [facility] The patient's identity was confirmed using their DOB and current address. The patient has consented to being evaluated through a telephone encounter and understands the associated risks (an examination cannot be done and the patient may need to come in for an appointment) / benefits (allows the patient to remain at home, decreasing exposure to coronavirus). I personally spent 10 minutes on medical discussion.

## 2019-05-23 ENCOUNTER — Other Ambulatory Visit: Payer: Self-pay | Admitting: Internal Medicine

## 2019-05-23 NOTE — Telephone Encounter (Signed)
Refill Request-  Pt called pharmacy  lisinopril (PRINIVIL,ZESTRIL) 10 MG tablet  [facility] - [place], [place] - [address]

## 2019-05-23 NOTE — Telephone Encounter (Signed)
Next appt scheduled 10/12 with PCP. 

## 2019-05-24 ENCOUNTER — Other Ambulatory Visit: Payer: Self-pay

## 2019-05-24 ENCOUNTER — Emergency Department (HOSPITAL_COMMUNITY)
Admission: EM | Admit: 2019-05-24 | Discharge: 2019-05-26 | Disposition: A | Discharge status: Other Acute Inpatient Hospital | Payer: Self-pay | Attending: Emergency Medicine | Admitting: Emergency Medicine

## 2019-05-24 ENCOUNTER — Encounter (HOSPITAL_COMMUNITY): Payer: Self-pay | Admitting: *Deleted

## 2019-05-24 DIAGNOSIS — I10 Essential (primary) hypertension: Secondary | ICD-10-CM | POA: Insufficient documentation

## 2019-05-24 DIAGNOSIS — Z79899 Other long term (current) drug therapy: Secondary | ICD-10-CM | POA: Insufficient documentation

## 2019-05-24 DIAGNOSIS — F17210 Nicotine dependence, cigarettes, uncomplicated: Secondary | ICD-10-CM | POA: Insufficient documentation

## 2019-05-24 DIAGNOSIS — Z046 Encounter for general psychiatric examination, requested by authority: Secondary | ICD-10-CM

## 2019-05-24 DIAGNOSIS — F29 Unspecified psychosis not due to a substance or known physiological condition: Secondary | ICD-10-CM | POA: Insufficient documentation

## 2019-05-24 DIAGNOSIS — Z20828 Contact with and (suspected) exposure to other viral communicable diseases: Secondary | ICD-10-CM | POA: Insufficient documentation

## 2019-05-24 DIAGNOSIS — F39 Unspecified mood [affective] disorder: Secondary | ICD-10-CM

## 2019-05-24 LAB — COMPREHENSIVE METABOLIC PANEL
ALT: 15 U/L (ref 0–44)
AST: 21 U/L (ref 15–41)
Albumin: 4.4 g/dL (ref 3.5–5.0)
Alkaline Phosphatase: 68 U/L (ref 38–126)
Anion gap: 9 (ref 5–15)
BUN: 10 mg/dL (ref 6–20)
CO2: 25 mmol/L (ref 22–32)
Calcium: 9.3 mg/dL (ref 8.9–10.3)
Chloride: 105 mmol/L (ref 98–111)
Creatinine, Ser: 0.98 mg/dL (ref 0.61–1.24)
GFR calc Af Amer: >60 mL/min (ref >60)
GFR calc non Af Amer: >60 mL/min (ref >60)
Glucose, Bld: 120 mg/dL — ABNORMAL HIGH (ref 70–99)
Potassium: 3.8 mmol/L (ref 3.5–5.1)
Sodium: 139 mmol/L (ref 135–145)
Total Bilirubin: 0.9 mg/dL (ref 0.3–1.2)
Total Protein: 7.5 g/dL (ref 6.5–8.1)

## 2019-05-24 LAB — CBC
HCT: 49 % (ref 39.0–52.0)
Hemoglobin: 16.1 g/dL (ref 13.0–17.0)
MCH: 28.4 pg (ref 26.0–34.0)
MCHC: 32.9 g/dL (ref 30.0–36.0)
MCV: 86.6 fL (ref 80.0–100.0)
Platelets: 217 10*3/uL (ref 150–400)
RBC: 5.66 MIL/uL (ref 4.22–5.81)
RDW: 13.6 % (ref 11.5–15.5)
WBC: 9.7 10*3/uL (ref 4.0–10.5)
nRBC: 0 % (ref 0.0–0.2)

## 2019-05-24 LAB — ETHANOL: Alcohol, Ethyl (B): <10 mg/dL (ref <10)

## 2019-05-24 LAB — SALICYLATE LEVEL: Salicylate Lvl: <7 mg/dL (ref 2.8–30.0)

## 2019-05-24 LAB — ACETAMINOPHEN LEVEL: Acetaminophen (Tylenol), Serum: <10 ug/mL — ABNORMAL LOW (ref 10–30)

## 2019-05-24 NOTE — ED Notes (Signed)
Sitter at bedside.

## 2019-05-24 NOTE — ED Triage Notes (Signed)
Pt arrives via [employer] under IVC. Per papers, the respondent is yelling "shoot me" to the police, sentences are not making sense and possible responding to someone who is not there, swinging a knife around and stating "I'm a cop killer".   In triage, pt says he does not want to hurt himself "or anyone else, I just want the cops that did the bad stuff to pay". Pt is rambling, but redirectable. Denies taking any type of psychiatric medications.
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
## 2019-05-24 NOTE — ED Notes (Addendum)
Pt heard screaming on the phone "I don't give a fuck. The governor himself told me to do it. [name] [name] doesn't know I have 10 billion dollars buried in the backyard. I called them myself. Have you been in the back of that police car. It isnt all about you. They don't care about. No help at all. I heard that fucking whore talking about my wife. I told [name] [name] about it." Pt rambling despite being redirected by staff.

## 2019-05-25 ENCOUNTER — Encounter (HOSPITAL_COMMUNITY): Payer: Self-pay | Admitting: Behavioral Health

## 2019-05-25 ENCOUNTER — Other Ambulatory Visit: Payer: Self-pay

## 2019-05-25 LAB — SARS CORONAVIRUS 2 BY RT PCR (HOSPITAL ORDER, PERFORMED IN ~~LOC~~ HOSPITAL LAB): SARS Coronavirus 2: NEGATIVE

## 2019-05-25 MED ORDER — HALOPERIDOL LACTATE 5 MG/ML IJ SOLN
5.0000 mg | Freq: Once | INTRAMUSCULAR | Status: AC
  Administered 2019-05-25: 5 mg via INTRAMUSCULAR

## 2019-05-25 MED ORDER — LISINOPRIL 10 MG PO TABS
10.0000 mg | ORAL_TABLET | Freq: Every day | ORAL | Status: DC
  Administered 2019-05-26: 10 mg via ORAL
  Filled 2019-05-25: qty 1

## 2019-05-25 MED ORDER — STERILE WATER FOR INJECTION IJ SOLN
INTRAMUSCULAR | Status: AC
  Administered 2019-05-25: 11:00:00 1.2 mL
  Filled 2019-05-25: qty 10

## 2019-05-25 MED ORDER — STERILE WATER FOR INJECTION IJ SOLN
INTRAMUSCULAR | Status: AC
  Administered 2019-05-25: 01:00:00
  Filled 2019-05-25: qty 10

## 2019-05-25 MED ORDER — HALOPERIDOL LACTATE 5 MG/ML IJ SOLN
INTRAMUSCULAR | Status: AC
  Filled 2019-05-25: qty 1

## 2019-05-25 MED ORDER — LISINOPRIL 10 MG PO TABS: 10.0000 mg | ORAL_TABLET | Freq: Every day | ORAL | 1 refills | Status: DC

## 2019-05-25 MED ORDER — ZIPRASIDONE MESYLATE 20 MG IM SOLR
20.0000 mg | Freq: Once | INTRAMUSCULAR | Status: AC
  Administered 2019-05-25: 20 mg via INTRAMUSCULAR
  Filled 2019-05-25: qty 20

## 2019-05-25 MED ORDER — ZIPRASIDONE MESYLATE 20 MG IM SOLR
20.0000 mg | Freq: Once | INTRAMUSCULAR | Status: AC
  Administered 2019-05-25: 01:00:00 20 mg via INTRAMUSCULAR
  Filled 2019-05-25: qty 20
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
## 2019-05-25 NOTE — ED Notes (Signed)
Pt sleeping. 

## 2019-05-25 NOTE — ED Notes (Addendum)
Pt. Became very agitated and got onto the face of the RN after explained that he needed to use a urinal instead of going to the BR. After pt., was given meds, he urinated into urinal and pour it into the sink stating "I thought you all wanted it poured out". This was done after the EMT asked for his urinal for a urine drug screen.

## 2019-05-25 NOTE — ED Notes (Signed)
Pt again becoming verbally aggressive, with aggessive body language, screaming about wanting to take a shower, demanding to leave.  Pt informed that he was unable to leave at this time, requested that he remain in his room, will be provided a urinal.  Pt with aggressive body stances, kicking trash can.  Security called to bedside.  EDP Belfi made aware.

## 2019-05-25 NOTE — ED Notes (Signed)
Pt provided verbal consent to update wife about his status.

## 2019-05-25 NOTE — ED Notes (Signed)
Restraints removed from pt. Pt is currently calm and cooperative with staff. Pt agreed to leave on cardiac monitor leads and pulse ox. Pt stated "I just want to go to sleep now" Staff continuing to monitor pt. Sitter present

## 2019-05-25 NOTE — ED Notes (Signed)
Sitter at bedside. Pt sleeping in bed, covered by blankets.

## 2019-05-25 NOTE — ED Notes (Addendum)
Pt is currently sleeping. Sitter present

## 2019-05-25 NOTE — ED Notes (Addendum)
Pt adamantly refusing covid swab. Pt becoming combative and screaming pt.
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
## 2019-05-25 NOTE — ED Notes (Signed)
Pt refusing to allow staff to collect vital signs.

## 2019-05-25 NOTE — BH Assessment (Signed)
Tele Assessment Note   Patient Name: [name] [name] MRN: [id number] Referring Physician: EDP Location of Patient: [facility] Location of Provider: [place] is a 58 y.o. male who presented to [facility] under IVC (petitioner is [employer]) after wielding a knife and telling police to shoot him.  Pt lives in [place].  He is employed, and he reported that his wife [name] [name] recently left him, but then returned home.  He denied current outpatient psychiatric and therapy services.  Per history, Pt has not been assessed by TTS before.  Per report, Pt was picked up by police after being found wielding a knife and asking police to kill him.  (Pt stated that he called the police himself).  Also per report, Pt appeared to respond to internal stimuli.  Pt also told staff that he has a ''Billion dollars in my backyard.''  Per report, Pt has been agitated, abusive, and aggressive at the hospital, threatening to kill or beat up staff, using racist epithets, and threatening to elope from hospital.  Also per report, Pt said he wanted to call his wife so he could ''say goodbye.''  Pt had to be restrained and given a sedative to calm.  To author, Pt was somewhat calmer, although he appeared restless and irritated.  He stated that he called the police himself because he is fighting against corrupt law enforcement, and that he does not want to be another ''[name] [name].''  Pt stated also that his wife recently left him, but that she returned.  Pt stated that he wanted to go home -- ''I have things to do.''   Pt denied suicidal ideation, homicidal ideation, hallucination, and self-injurious behavior.  He endorsed past use of street drugs, but denied use since 2009.  Pt's UDS and BAC were not available at time of assessment.    During assessment, Pt presented as alert and oriented.  He had fair eye contact.  Demeanor was guarded and irritable.  Pt was gowned, and he appeared appropriately groomed.   Pt's mood was angry and preoccupied.  Affect was mood congruent.  Pt's speech was rapid but otherwise normal in volume and rhythm.  Thought processes were within rapid.  Thought content suggested delusion, and although Pt denied hallucination, he was reported to have responded to internal stimuli earlier.  Memory and concentration were fair to poor.  Insight, judgment, and impulse control were poor.  Author was unable to obtain collateral from Pt's wife.  No answer.  Consulted with Berneta Levins, NP, who determined that Pt meets inpatient criteria.  Diagnosis: Brief Psychotic Disorder; PTSD  Past Medical History:  Past Medical History:  Diagnosis Date  . Anemia   . Cardiac arrest ([place]) 12/2014  . Depression    Resolved- pt contributes to previous drug use  . Hemorrhoids   . Hepatitis C    S/p completing treatment at [facility]  . IV drug abuse ([place])    Quit. Used in the 55s  . PTSD (post-traumatic stress disorder)    Found deceased brother- resolved per pt  . PULMONARY NODULE 12/21/2009   Stable 32mm per repeat CXR 4/11  . Substance abuse ([place])   . Tobacco abuse    1 ppd x 60 ys. Quit 04/22/12    Past Surgical History:  Procedure Laterality Date  . CARDIAC CATHETERIZATION N/A 01/26/2015   Procedure: Left Heart Cath and Coronary Angiography;  Surgeon: Leonie Man, MD;  Location: [facility];  Service: Cardiovascular;  Laterality: N/A;  . CYST REMOVAL NECK  2011  . EP IMPLANTABLE DEVICE N/A 01/29/2015   Procedure: SubQ ICD Implant;  Surgeon: Deboraha Sprang, MD;  Location: [facility];  Service: Cardiovascular;  Laterality: N/A;  . FRACTURE SURGERY Left    In high school  . MULTIPLE TOOTH EXTRACTIONS  1999  . WISDOM TOOTH EXTRACTION      Family History:  Family History  Problem Relation Age of Onset  . Hyperlipidemia Mother   . Hypertension Mother   . COPD Mother   . Colon polyps Mother 42  . Heart disease Father        Decease from MI at 50yo  . Cancer Maternal  Grandfather        Colon cancer, deceased  . Alcohol abuse Maternal Grandfather   . Colon cancer Maternal Grandfather 71  . Esophageal cancer Neg Hx   . Rectal cancer Neg Hx     Social History:  reports that he has been smoking cigarettes. He has a 14.50 pack-year smoking history. He has never used smokeless tobacco. He reports current alcohol use of about 6.0 standard drinks of alcohol per week. He reports current drug use. Drugs: "Crack" cocaine, Marijuana, and Heroin.  Additional Social History:  Alcohol / Drug Use Pain Medications: See MAR Prescriptions: See MAR Over the Counter: See MAR History of alcohol / drug use?: Yes(Per history, Pt's last drug use was 2009.)  CIWA: CIWA-Ar BP: (!) 131/96 Pulse Rate: 96 COWS:    Allergies: No Known Allergies  Home Medications: (Not in a hospital admission)   OB/GYN Status:  No LMP for male patient.  General Assessment Data [facility] of Assessment: [facility] TTS Assessment: In system Is this a Tele or Face-to-Face Assessment?: Tele Assessment Is this an Initial Assessment or a Re-assessment for this encounter?: Initial Assessment Patient Accompanied by:: N/A Language Other than English: No Living Arrangements: Other (Comment) What gender do you identify as?: Male Marital status: Married Living Arrangements: Spouse/significant other(Wife is listed as ''[name] [name]'') Can pt return to current living arrangement?: No Admission Status: Involuntary Petitioner: Police Is patient capable of signing voluntary admission?: Yes Referral Source: Self/Family/Friend Insurance type: None     Crisis Care Plan Living Arrangements: Spouse/significant other(Wife is listed as ''[name] [name]'') Name of Psychiatrist: None Name of Therapist: None  Education Status Is patient currently in school?: No Is the patient employed, unemployed or receiving disability?: Employed(Pt described Biomedical scientist)  Risk to self with the past 6 months Suicidal Ideation:  Yes-Currently Present(Pt denied, but see notes) Has patient been a risk to self within the past 6 months prior to admission? : Other (comment)(Unknown) Suicidal Intent: Yes-Currently Present(Pt denied; see notes) Has patient had any suicidal intent within the past 6 months prior to admission? : Other (comment)(Unknown) Is patient at risk for suicide?: Yes Suicidal Plan?: Yes-Currently Present(Pt denied; see notes) Has patient had any suicidal plan within the past 6 months prior to admission? : Other (comment)(Unknown) Specify Current Suicidal Plan: Suicide by police Access to Means: Yes Specify Access to Suicidal Means: Pt had a knife What has been your use of drugs/alcohol within the last 12 months?: Pt denied; UDS/BAC not available Previous Attempts/Gestures: No(Pt denied) Intentional Self Injurious Behavior: None Family Suicide History: Yes(Pt said brother died by gunshot) Recent stressful life event(s): Loss (Comment)(Said wife left him) Persecutory voices/beliefs?: No Depression: Yes Depression Symptoms: Feeling angry/irritable, Despondent, Feeling worthless/self pity Substance abuse history and/or treatment for substance abuse?: No Suicide prevention information given to  non-admitted patients: Not applicable  Risk to Others within the past 6 months Homicidal Ideation: Yes-Currently Present(Pt denied; see notes) Does patient have any lifetime risk of violence toward others beyond the six months prior to admission? : Unknown Thoughts of Harm to Others: Yes-Currently Present(Pt denied; see notes) Comment - Thoughts of Harm to Others: Wants to kill ''dirty cops,'' hospital staff Current Homicidal Intent: Yes-Currently Present(Pt communicated threats) Current Homicidal Plan: Yes-Currently Present Describe Current Homicidal Plan: Beat people up, shoot Access to Homicidal Means: (Unknown) History of harm to others?: (Unknown) Assessment of Violence: On admission Violent Behavior  Description: Wielding a knife on admission Does patient have access to weapons?: Yes (Comment) Criminal Charges Pending?: No Does patient have a court date: No Is patient on probation?: No  Psychosis Hallucinations: Auditory, Visual(Pt denied; responded to internal stimuli) Delusions: Persecutory  Mental Status Report Appearance/Hygiene: Unremarkable, In hospital gown Eye Contact: Fair Motor Activity: Freedom of movement Speech: Logical/coherent, Other (Comment)(Has been abusive and threatening to hospital staff) Level of Consciousness: Combative Mood: Preoccupied, Angry Affect: Angry, Preoccupied Anxiety Level: None Thought Processes: Circumstantial Judgement: Impaired Orientation: Person, Place, Time Obsessive Compulsive Thoughts/Behaviors: None  Cognitive Functioning Concentration: Fair Memory: Recent Impaired, Remote Impaired Is patient IDD: No Insight: Poor Impulse Control: Poor Sleep: No Change Vegetative Symptoms: None  ADLScreening [facility] Assessment Services) Patient's cognitive ability adequate to safely complete daily activities?: Yes Patient able to express need for assistance with ADLs?: Yes Independently performs ADLs?: Yes (appropriate for developmental age)  Prior Inpatient Therapy Prior Inpatient Therapy: No(Unknown)  Prior Outpatient Therapy Prior Outpatient Therapy: (Unknown)  ADL Screening (condition at time of admission) Patient's cognitive ability adequate to safely complete daily activities?: Yes Is the patient deaf or have difficulty hearing?: No Does the patient have difficulty seeing, even when wearing glasses/contacts?: No Patient able to express need for assistance with ADLs?: Yes Does the patient have difficulty dressing or bathing?: No Independently performs ADLs?: Yes (appropriate for developmental age) Does the patient have difficulty walking or climbing stairs?: No Weakness of Legs: None Weakness of Arms/Hands: None  Home Assistive  Devices/Equipment Home Assistive Devices/Equipment: None  Therapy Consults (therapy consults require a physician order) PT Evaluation Needed: No OT Evalulation Needed: No SLP Evaluation Needed: No Abuse/Neglect Assessment (Assessment to be complete while patient is alone) Abuse/Neglect Assessment Can Be Completed: Yes Physical Abuse: Denies Verbal Abuse: Denies Sexual Abuse: Denies Exploitation of patient/patient's resources: Denies Self-Neglect: Denies Values / Beliefs Cultural Requests During Hospitalization: None Spiritual Requests During Hospitalization: None Consults Spiritual Care Consult Needed: No Social Work Consult Needed: No Regulatory affairs officer (For Healthcare) Does Patient Have a Medical Advance Directive?: No Would patient like information on creating a medical advance directive?: No - Patient declined          Disposition:  Disposition Initial Assessment Completed for this Encounter: Yes Disposition of Patient: Admit Type of inpatient treatment program: Adult(Per T. Starkes, NP, Pt meets inpt criteria)  This service was provided via telemedicine using a 2-way, interactive audio and video technology.  Names of all persons participating in this telemedicine service and their role in this encounter. Name: Melvern Sample Role: Pt             Marlowe Aschoff 05/25/2019 9:16 AM

## 2019-05-25 NOTE — ED Notes (Addendum)
Staff attempted to remove phone in room from pt because pt kept calling wife despite staff telling him not to. Pt became angry and screamed  "fuck you. You fucking bitch. You aren't taking this fucking phone from me. I have escaped from a hospital before and i'll do it again. I am not giving you the phone until I say bye to my wife" Staff politely asked pt to give the phone because he is not allowed to have it. Pt jumped out of bed and smashed phone against the ground and screamed "you want the fucking phone then fucking take it you stupid ass blonde bitch. Go fuck yourself." Pt charged at staff members. Security called and assisted pt back into the bed. Pt kicking, screaming, biting, and threatening to spit at staff members. Pt stated things like "fuck you bitch. i'll remember that face. You better hope I don't see you on the streets because I have friends who will fuck you up. I will sue yall and this hospital for everything it is worth. You are going to regret this." MD notified and Geodon administered by charge RN. Restraints applied by staff members and security. Pt attempting to break out restraints, attempting to kick staff with his feet, and grab at thighs with hands. Pt repeatdly screaming "fuck you, I am suing this place. Fuck you, I remember that face." Pt using inappropriate language such as "whore, slut and the N word" when yelling at staff. Staff informed pt we need to monitor his heart by applying cardiac monitor leads to obtain an EKG. Pt screaming "you dont give a fuck about my heart. Fuck you."  EKG was taken and pt's vitals being monitored.

## 2019-05-25 NOTE — ED Notes (Signed)
Amada Jupiter (wife) @ [phone]

## 2019-05-25 NOTE — ED Notes (Addendum)
Pt. Became aggressive with staff calling them "bitches and whores" when RN asked pt to give her the phone. Pt. Threw the phone on the floor and it broke and calling staff you (Fuckin Bitches), and calling the staff, "the N word." Security at bedside as well as multiple staff members. Pt. Placed in restraints for the safety of staff members and MD at bedside.

## 2019-05-25 NOTE — ED Notes (Signed)
At approximately 0030, this Probation officer and other staff members heard screams coming from room 3. Staff responded to room 3 and found pt charging at staff members, screaming racial slurs at staff members and attempting to hurt staff after phone from room was attempting to be taken. Pt threatening to spit in the eyes of a security guard, swung at [place], Agricultural consultant, kicked Journalist, newspaper, Therapist, sports, attempted to grab this writers thigh and attempted to harm other staff members with no success.  Palumbo, MD called to bedside during pt threatening staff members and meds ordered to calm pt. While meds were attempting to be administered, pt thrashed around in bed, again attempting to harm staff. After meds administered, pt placed in restraints for safety of staff members and pt.  While restraining pt, pt continuously, verbally threatening all staff members present and attempting to lunge at staff through restraints.  Security present throughout event.

## 2019-05-25 NOTE — ED Notes (Signed)
Pt. Sitting on chair with head on table. Asked pt., if they would feel more comfortable on stretcher. Pt. Stated that he is tired of being on the bed, will be fine on the chair. Will continue to monitor pt.

## 2019-05-25 NOTE — ED Notes (Signed)
TTS monitor wheeled to bedside.

## 2019-05-25 NOTE — ED Notes (Signed)
Pt.s breakfast has arrived. 

## 2019-05-25 NOTE — ED Notes (Signed)
Pt overheard being verbally aggressive towards NT.  Pt screaming about being abused, "Im not going to be [name] [name], they kept holding me around my neck..." Pt screaming profanity.  NT attempted redirecting pts line of thought, requesting that he speak in lower tones, more respectfully.  Pt continuing to scream profanity.  Security called to bedside.

## 2019-05-25 NOTE — ED Notes (Signed)
Pt. Woke up. Asking "Why haven't they sent me to the mental hospital in [place] or [place]? Explained to pt., that this writer did not have the answers to those questions but whenever his nurse arrived we would have those answers. Pt. Verbally agreed he understood and went back to sleep.

## 2019-05-25 NOTE — Progress Notes (Addendum)
This patient continues to meet inpatient criteria. CSW faxed information to the following facilities:   [place]- no beds, referral will not be reviewed [facility]- no beds available today, will review  [facility]- no beds available today, will review  [facility]  [facility] will also review for appropriate bed availability.  This patient is under IVC at this time. If accepted to a behavioral health facility, they will require sheriff transportation.  Stephanie Acre, [place] Social Worker [phone]

## 2019-05-25 NOTE — ED Notes (Signed)
RN asked an EKG for this pt. Woke pt., up and pt. Seem calm until 2 minutes into attempting to get an EKG. PT. Began saying "I don't need an EKG after what those bit--es did to me last night. Anyone could have had a heart attack". EMT came into the room to help due to the pt being verbally loud. Although, pt. Was yelling and moving around, the EMT and this writer was able to perform an EKG.

## 2019-05-25 NOTE — [facility] Counselor (Signed)
Per nurse and chart review patient had to be restrained and hw was given Geodon.  Writer unable to assess the patient.

## 2019-05-25 NOTE — ED Notes (Signed)
Went into Pt's room to take phone located in the room. Pt was on the phone with his wife and demanded that I wait until his wife returned to the phone. While pt was waiting for wife, I explained that he could not continue calling his wife over and over and that staff would need to take his phone. Also explained to pt that because he was hospitalized under a psychiatric hold he could not have phone calls at this time of the night. Pt became loud and aggressive. Pt yelling at Clarise Cruz F./RN, calling her a "little white bitch". Pt then jumped out of the bed and threw the phone on the floor as he yelled, "You want your fucking phone! Here is your fucking phone!" Security called to bedside. Mortimer Fries B./ Agricultural consultant and EDP Palumbo at bedside. Pt continually yelling obscenities and racial slurs at staff. Pt then became combative towards staff, attempting to strike staff. Mortimer Fries B./Charge RN left room to get medication. Estill Bamberg R./NT attempted to talk to pt and calm him down, but pt remained uncooperative and aggressive. Pt again threatening staff members with violence. Pt threatened to spit in the eyes of Animal nutritionist. Upon the return of Bobby B./Charge RN, Pt attempted to slap the medication out of his hand and fight staff. Pt was medicated by Mortimer Fries B./Charge RN and restrained for his personal safety and the safety of staff members.

## 2019-05-25 NOTE — ED Notes (Signed)
Pt.s currently talking to TTS at the moment.

## 2019-05-25 NOTE — ED Notes (Addendum)
Pt agreed to covid swab

## 2019-05-26 ENCOUNTER — Encounter (HOSPITAL_COMMUNITY): Payer: Self-pay | Admitting: Emergency Medicine

## 2019-05-26 ENCOUNTER — Other Ambulatory Visit: Payer: Self-pay

## 2019-05-26 ENCOUNTER — Inpatient Hospital Stay (HOSPITAL_COMMUNITY)
Admission: AD | Admit: 2019-05-26 | Discharge: 2019-06-03 | DRG: 885 | Disposition: A | Discharge status: Home / Self Care | Payer: No Typology Code available for payment source | Source: Intra-hospital | Attending: Psychiatry | Admitting: Psychiatry

## 2019-05-26 DIAGNOSIS — F062 Psychotic disorder with delusions due to known physiological condition: Secondary | ICD-10-CM | POA: Diagnosis not present

## 2019-05-26 DIAGNOSIS — I10 Essential (primary) hypertension: Secondary | ICD-10-CM | POA: Diagnosis present

## 2019-05-26 DIAGNOSIS — Z79899 Other long term (current) drug therapy: Secondary | ICD-10-CM

## 2019-05-26 DIAGNOSIS — F23 Brief psychotic disorder: Secondary | ICD-10-CM | POA: Diagnosis present

## 2019-05-26 DIAGNOSIS — F17210 Nicotine dependence, cigarettes, uncomplicated: Secondary | ICD-10-CM | POA: Diagnosis present

## 2019-05-26 DIAGNOSIS — Z825 Family history of asthma and other chronic lower respiratory diseases: Secondary | ICD-10-CM

## 2019-05-26 DIAGNOSIS — Z811 Family history of alcohol abuse and dependence: Secondary | ICD-10-CM

## 2019-05-26 DIAGNOSIS — Z8349 Family history of other endocrine, nutritional and metabolic diseases: Secondary | ICD-10-CM

## 2019-05-26 DIAGNOSIS — Z79891 Long term (current) use of opiate analgesic: Secondary | ICD-10-CM | POA: Diagnosis not present

## 2019-05-26 DIAGNOSIS — Z8371 Family history of colonic polyps: Secondary | ICD-10-CM | POA: Diagnosis not present

## 2019-05-26 DIAGNOSIS — I252 Old myocardial infarction: Secondary | ICD-10-CM | POA: Diagnosis not present

## 2019-05-26 DIAGNOSIS — Z8619 Personal history of other infectious and parasitic diseases: Secondary | ICD-10-CM

## 2019-05-26 DIAGNOSIS — F068 Other specified mental disorders due to known physiological condition: Secondary | ICD-10-CM | POA: Diagnosis present

## 2019-05-26 DIAGNOSIS — F3160 Bipolar disorder, current episode mixed, unspecified: Secondary | ICD-10-CM | POA: Diagnosis present

## 2019-05-26 DIAGNOSIS — Z9119 Patient's noncompliance with other medical treatment and regimen: Secondary | ICD-10-CM

## 2019-05-26 DIAGNOSIS — Z818 Family history of other mental and behavioral disorders: Secondary | ICD-10-CM | POA: Diagnosis not present

## 2019-05-26 DIAGNOSIS — Z800 Family history of malignant neoplasm of digestive organs: Secondary | ICD-10-CM | POA: Diagnosis not present

## 2019-05-26 DIAGNOSIS — R456 Violent behavior: Secondary | ICD-10-CM | POA: Diagnosis not present

## 2019-05-26 DIAGNOSIS — F1290 Cannabis use, unspecified, uncomplicated: Secondary | ICD-10-CM | POA: Diagnosis present

## 2019-05-26 DIAGNOSIS — F482 Pseudobulbar affect: Secondary | ICD-10-CM | POA: Diagnosis present

## 2019-05-26 DIAGNOSIS — Z79890 Hormone replacement therapy (postmenopausal): Secondary | ICD-10-CM

## 2019-05-26 DIAGNOSIS — Z8249 Family history of ischemic heart disease and other diseases of the circulatory system: Secondary | ICD-10-CM

## 2019-05-26 DIAGNOSIS — S06890S Other specified intracranial injury without loss of consciousness, sequela: Secondary | ICD-10-CM

## 2019-05-26 DIAGNOSIS — X58XXXS Exposure to other specified factors, sequela: Secondary | ICD-10-CM | POA: Diagnosis present

## 2019-05-26 DIAGNOSIS — T798XXA Other early complications of trauma, initial encounter: Secondary | ICD-10-CM

## 2019-05-26 DIAGNOSIS — Z23 Encounter for immunization: Secondary | ICD-10-CM

## 2019-05-26 DIAGNOSIS — Z8674 Personal history of sudden cardiac arrest: Secondary | ICD-10-CM

## 2019-05-26 DIAGNOSIS — F3489 Other specified persistent mood disorders: Secondary | ICD-10-CM | POA: Diagnosis not present

## 2019-05-26 DIAGNOSIS — E538 Deficiency of other specified B group vitamins: Secondary | ICD-10-CM | POA: Diagnosis present

## 2019-05-26 DIAGNOSIS — F600 Paranoid personality disorder: Secondary | ICD-10-CM | POA: Diagnosis not present

## 2019-05-26 MED ORDER — NICOTINE 21 MG/24HR TD PT24
21.0000 mg | MEDICATED_PATCH | Freq: Every day | TRANSDERMAL | Status: DC
  Filled 2019-05-26 (×10): qty 1

## 2019-05-26 MED ORDER — INFLUENZA VAC SPLIT QUAD 0.5 ML IM SUSY
0.5000 mL | PREFILLED_SYRINGE | INTRAMUSCULAR | Status: AC
  Administered 2019-05-27: 12:00:00 0.5 mL via INTRAMUSCULAR
  Filled 2019-05-26: qty 0.5

## 2019-05-26 MED ORDER — PNEUMOCOCCAL VAC POLYVALENT 25 MCG/0.5ML IJ INJ
0.5000 mL | INJECTION | INTRAMUSCULAR | Status: AC
  Administered 2019-05-27: 0.5 mL via INTRAMUSCULAR

## 2019-05-26 MED ORDER — NITROGLYCERIN 0.4 MG SL SUBL: 0.4000 mg | SUBLINGUAL_TABLET | SUBLINGUAL | Status: DC | PRN

## 2019-05-26 NOTE — Progress Notes (Signed)
Patient ID: [name] [name], male   DOB: 01-Jul-1961, 58 y.o.   MRN: [id number]   [place] NOVEL CORONAVIRUS (COVID-19) DAILY CHECK-OFF SYMPTOMS - answer yes or no to each - every day NO YES  Have you had a fever in the past 24 hours?  . Fever (Temp > 37.80C / 100F) X   Have you had any of these symptoms in the past 24 hours? . New Cough .  Sore Throat  .  Shortness of Breath .  Difficulty Breathing .  Unexplained Body Aches   X   Have you had any one of these symptoms in the past 24 hours not related to allergies?   . Runny Nose .  Nasal Congestion .  Sneezing   X   If you have had runny nose, nasal congestion, sneezing in the past 24 hours, has it worsened?  X   EXPOSURES - check yes or no X   Have you traveled outside the state in the past 14 days?  X   Have you been in contact with someone with a confirmed diagnosis of COVID-19 or PUI in the past 14 days without wearing appropriate PPE?  X   Have you been living in the same home as a person with confirmed diagnosis of COVID-19 or a PUI (household contact)?    X   Have you been diagnosed with COVID-19?    X              What to do next: Answered NO to all: Answered YES to anything:   Proceed with unit schedule Follow the [facility] Inpatient Flowsheet.

## 2019-05-26 NOTE — Progress Notes (Signed)
Patient ID: [name] [name], male   DOB: 1961-08-22, 58 y.o.   MRN: [id number]   D: Pt alert and oriented during [facility] admission process. Pt denies SI/HI, A/VH, and any pain. Pt is cooperative.  A: Education, support, reassurance, and encouragement provided, q15 minute safety checks initiated. Pt has no belongings in locker; no locker assigned.  "[name] [name] is a 58 y.o. male patient admitted after being IVC petitioned by [employer] after he was flashing a knife and telling the police to shoot him.  HPI:  Pt has past psychiatric hx of substance abuse. He was brought in by [facility] after being found flashing a knife and asking the police to kill him. He called 911 by himself. He told the staff in the ED that he has a billion dollars in his backyard. He was agitated and abusive towards staff in the ED and threatened to elope from the ED. He refused to give his urine sample for urine drug screen. He was evaluated by Counselor/TTS on 05/25/2019 and as per his assessment, pt was irritable and guarded. He was angry and appeared to be delusional."   R: Pt denies any concerns at this time, and verbally contracts for safety. Pt ambulating on the unit with no issues. Pt remains safe on and off the unit.

## 2019-05-26 NOTE — BH Assessment (Addendum)
[facility] Assessment Progress Note  Per Buford Dresser, DO, this pt requires psychiatric hospitalization.  Heather, RN, has assigned pt to [facility] Rm 407-2.  Pt presents under IVC initiated by law enforcement, and upheld by EDP Blanchie Dessert, MD,, and IVC documents have been faxed to [facility].  Pt's nurse, Nena Jordan, has been notified, and agrees to call report to [phone].  Pt is to be transported via Event organiser.   Jalene Mullet, [place] Coordinator [phone]

## 2019-05-26 NOTE — ED Notes (Signed)
Pt discharged safely with [employer].  All belongings were sent with patient. 

## 2019-05-26 NOTE — Tx Team (Signed)
Initial Treatment Plan 05/26/2019 5:57 PM [name] [id number]    PATIENT STRESSORS: Loss of wife "she left me but came back" Marital or family conflict   PATIENT STRENGTHS: Capable of independent living Financial means Supportive family/friends   PATIENT IDENTIFIED PROBLEMS: "Anger"  "Irritablity"  "Trouble sleeping"                 DISCHARGE CRITERIA:  Ability to meet basic life and health needs Improved stabilization in mood, thinking, and/or behavior Verbal commitment to aftercare and medication compliance  PRELIMINARY DISCHARGE PLAN: Outpatient therapy Participate in family therapy Return to previous living arrangement Return to previous work or school arrangements  PATIENT/FAMILY INVOLVEMENT: This treatment plan has been presented to and reviewed with the patient, [name] [name], and/or family member.  The patient and family have been given the opportunity to ask questions and make suggestions.  Harriet Masson, RN 05/26/2019, 5:57 PM

## 2019-05-26 NOTE — Discharge Summary (Addendum)
  Patient is to be transferred to [place] for inpatient psychiatric treatment  Patient's chart reviewed. Reviewed the information documented and agree with the treatment plan.  Buford Dresser, DO 05/26/19 5:49 PM

## 2019-05-26 NOTE — Progress Notes (Signed)
Psychoeducational Group Note  Date:  05/26/2019 Time:  2041  Group Topic/Focus:  Wrap-Up Group:   The focus of this group is to help patients review their daily goal of treatment and discuss progress on daily workbooks.  Participation Level: Did Not Attend  Participation Quality:  Not Applicable  Affect:  Not Applicable  Cognitive:  Not Applicable  Insight:  Not Applicable  Engagement in Group: Not Applicable  Additional Comments:  The patient did not attend group.   Archie Balboa S 05/26/2019, 8:41 PM

## 2019-05-26 NOTE — Consult Note (Addendum)
Telepsych Consultation   Reason for Consult:  suicidal statements Referring Physician:  Antonietta Breach, PA-C Location of Patient: [facility] Location of Provider: [facility]  Patient Identification: [name] [name] MRN:  [id number] Principal Diagnosis: Unspecified mood (affective ) disorder ; need to rule out bipolar disorder   Total Time spent with patient: 45 minutes  Subjective:   [name] [name] is a 58 y.o. male patient admitted after being IVC petitioned by [employer] after he was flashing a knife and telling the police to shoot him.  HPI:  Pt has past psychiatric hx of substance abuse. He was brought in by [facility] after being found flashing a knife and asking the police to kill him. He called 911 by himself. He told the staff in the ED that he has a billion dollars in his backyard. He was agitated and abusive towards staff in the ED and threatened to elope from the ED. He refused to give his urine sample for urine drug screen. He was evaluated by Counselor/TTS on 05/25/2019 and as per his assessment, pt was irritable and guarded. He was angry and appeared to be delusional.   Today, upon our assessment, pt was noted to be calm and cooperative. He reported that he was upset as he has an ongoing legal case that has continued for past many years. He also informed that his wife recently left him but then returned back. He also mentioned being stressed due to his brother's death that occurred in 28-Nov-1981. He informed he was found dead due to a gun shot wound behind their house. He denied being intoxicated with alcohol or any other illicit substances at the time of the incident.  He denied any hallucinations or delusions. He denied any current suicidal or homicidal ideations. He stated that he was feeling fine and asked to be discharged.  His wife Mrs. [name] [name] [phone]) was contacted by the writer for collateral information. She expressed serious concerns about patient's mood and  behaviors. She informed that pt had a cardiac arrest about 4.5 years ago and since then he has had intense mood swings. She informed that his mood outbursts are progressively worsening over the last few years. He can be calm and then suddenly loses his temper and has an outburst of cursing and yelling. He becomes physically aggressive and has destroyed property on several occassions including the front door of their house. These episodes of mood and anger outbursts last for a few days at at time. She also reported observing poor sleep. She has not seen him responding to internal stimuli. She expressed concern for her safety around him and therefore moved out of their house. She has still not returned back to their house as pt broke down the main door of the house.She is living some where and she does not want the pt to find out of her location. She has been in constant touch with him though via phone. She wants pt to be evaluated for bipolar disorder and be prescribed medication to help his mood and anger.  Past Psychiatric History: Past hx of substance and illicit drug use  Risk to Self: Suicidal Ideation: Yes-Currently Present(Pt denied, but see notes) Suicidal Intent: Yes-Currently Present(Pt denied; see notes) Is patient at risk for suicide?: Yes Suicidal Plan?: Yes-Currently Present(Pt denied; see notes) Specify Current Suicidal Plan: Suicide by police Access to Means: Yes Specify Access to Suicidal Means: Pt had a knife What has been your use of drugs/alcohol within the last 12 months?:  Pt denied; UDS/BAC not available Intentional Self Injurious Behavior: None Risk to Others: Homicidal Ideation: Yes-Currently Present(Pt denied; see notes) Thoughts of Harm to Others: Yes-Currently Present(Pt denied; see notes) Comment - Thoughts of Harm to Others: Wants to kill ''dirty cops,'' hospital staff Current Homicidal Intent: Yes-Currently Present(Pt communicated threats) Current Homicidal Plan:  Yes-Currently Present Describe Current Homicidal Plan: Beat people up, shoot Access to Homicidal Means: (Unknown) History of harm to others?: (Unknown) Assessment of Violence: On admission Violent Behavior Description: Wielding a knife on admission Does patient have access to weapons?: Yes (Comment) Criminal Charges Pending?: No Does patient have a court date: No Prior Inpatient Therapy: Prior Inpatient Therapy: No(Unknown) Prior Outpatient Therapy: Prior Outpatient Therapy: (Unknown)  Past Medical History:  Past Medical History:  Diagnosis Date  . Anemia   . Cardiac arrest ([place]) 12/2014  . Depression    Resolved- pt contributes to previous drug use  . Hemorrhoids   . Hepatitis C    S/p completing treatment at [facility]  . IV drug abuse ([place])    Quit. Used in the 86s  . PTSD (post-traumatic stress disorder)    Found deceased brother- resolved per pt  . PULMONARY NODULE 12/21/2009   Stable 58mm per repeat CXR 4/11  . Substance abuse ([place])   . Tobacco abuse    1 ppd x 64 ys. Quit 04/22/12    Past Surgical History:  Procedure Laterality Date  . CARDIAC CATHETERIZATION N/A 01/26/2015   Procedure: Left Heart Cath and Coronary Angiography;  Surgeon: Leonie Man, MD;  Location: [place] [facility];  Service: Cardiovascular;  Laterality: N/A;  . CYST REMOVAL NECK  2011  . EP IMPLANTABLE DEVICE N/A 01/29/2015   Procedure: SubQ ICD Implant;  Surgeon: Deboraha Sprang, MD;  Location: [place] [facility];  Service: Cardiovascular;  Laterality: N/A;  . FRACTURE SURGERY Left    In high school  . MULTIPLE TOOTH EXTRACTIONS  1999  . WISDOM TOOTH EXTRACTION     Family History:  Family History  Problem Relation Age of Onset  . Hyperlipidemia Mother   . Hypertension Mother   . COPD Mother   . Colon polyps Mother 47  . Heart disease Father        Decease from MI at 58yo  . Cancer Maternal Grandfather        Colon cancer, deceased  . Alcohol abuse Maternal Grandfather   . Colon cancer Maternal  Grandfather 35  . Esophageal cancer Neg Hx   . Rectal cancer Neg Hx    Family Psychiatric  History:  Pt denied any family psychiatric hx.  Social History:  Social History   Substance and Sexual Activity  Alcohol Use Yes  . Alcohol/week: 6.0 standard drinks  . Types: 6 Cans of beer per week   Comment: occassional     Social History   Substance and Sexual Activity  Drug Use Yes  . Types: "Crack" cocaine, Marijuana, Heroin   Comment: Quit IV drugs in the '80s. Last crack use 2009 or 2010.    Social History   Socioeconomic History  . Marital status: Married    Spouse name: Not on file  . Number of children: Not on file  . Years of education: Not on file  . Highest education level: Not on file  Occupational History  . Not on file  Social Needs  . Financial resource strain: Not on file  . Food insecurity    Worry: Not on file  Inability: Not on file  . Transportation needs    Medical: Not on file    Non-medical: Not on file  Tobacco Use  . Smoking status: Current Every Day Smoker    Packs/day: 0.50    Years: 29.00    Pack years: 14.50    Types: Cigarettes  . Smokeless tobacco: Never Used  . Tobacco comment: 5-10 cigs per day  Substance and Sexual Activity  . Alcohol use: Yes    Alcohol/week: 6.0 standard drinks    Types: 6 Cans of beer per week    Comment: occassional  . Drug use: Yes    Types: "Crack" cocaine, Marijuana, Heroin    Comment: Quit IV drugs in the '80s. Last crack use 2009 or 2010.  Marland Kitchen Sexual activity: Yes    Partners: Female  Lifestyle  . Physical activity    Days per week: Not on file    Minutes per session: Not on file  . Stress: Not on file  Relationships  . Social Herbalist on phone: Not on file    Gets together: Not on file    Attends religious service: Not on file    Active member of club or organization: Not on file    Attends meetings of clubs or organizations: Not on file    Relationship status: Not on file  Other  Topics Concern  . Not on file  Social History Narrative   ** Merged History Encounter **    Pt lives in [place].  He stated that his wife left him, but then stated that wife returned home.  Pt denied outpatient provider.   Additional Social History:    Allergies:  No Known Allergies  Labs:  Results for orders placed or performed during the hospital encounter of 05/24/19 (from the past 48 hour(s))  Comprehensive metabolic panel     Status: Abnormal   Collection Time: 05/24/19  9:31 PM  Result Value Ref Range   Sodium 139 135 - 145 mmol/L   Potassium 3.8 3.5 - 5.1 mmol/L   Chloride 105 98 - 111 mmol/L   CO2 25 22 - 32 mmol/L   Glucose, Bld 120 (H) 70 - 99 mg/dL   BUN 10 6 - 20 mg/dL   Creatinine, Ser 0.98 0.61 - 1.24 mg/dL   Calcium 9.3 8.9 - 10.3 mg/dL   Total Protein 7.5 6.5 - 8.1 g/dL   Albumin 4.4 3.5 - 5.0 g/dL   AST 21 15 - 41 U/L   ALT 15 0 - 44 U/L   Alkaline Phosphatase 68 38 - 126 U/L   Total Bilirubin 0.9 0.3 - 1.2 mg/dL   GFR calc non Af Amer >60 >60 mL/min   GFR calc Af Amer >60 >60 mL/min   Anion gap 9 5 - 15    Comment: Performed at [facility], [place] [address], [place], [place] [address]  Ethanol     Status: None   Collection Time: 05/24/19  9:31 PM  Result Value Ref Range   Alcohol, Ethyl (B) <10 <10 mg/dL    Comment: (NOTE) Lowest detectable limit for serum alcohol is 10 mg/dL. For medical purposes only. Performed at [facility], [place] [address], [place], [place] [id number]   Salicylate level     Status: None   Collection Time: 05/24/19  9:31 PM  Result Value Ref Range   Salicylate Lvl [id number] 2.8 - 30.0 mg/dL    Comment: Performed at [facility], [place] [address], [place], [place] [address]  Acetaminophen level     Status: Abnormal   Collection Time: 05/24/19  9:31 PM  Result Value Ref Range   Acetaminophen (Tylenol), Serum <10 (L) 10 - 30 ug/mL    Comment: (NOTE) Therapeutic concentrations  vary significantly. A range of 10-30 ug/mL  may be an effective concentration for many patients. However, some  are best treated at concentrations outside of this range. Acetaminophen concentrations >150 ug/mL at 4 hours after ingestion  and >50 ug/mL at 12 hours after ingestion are often associated with  toxic reactions. Performed at [facility], [place] [address] [place], [place] [address]   cbc     Status: None   Collection Time: 05/24/19  9:31 PM  Result Value Ref Range   WBC 9.7 4.0 - 10.5 K/uL   RBC 5.66 4.22 - 5.81 MIL/uL   Hemoglobin 16.1 13.0 - 17.0 g/dL   HCT 49.0 39.0 - 52.0 %   MCV 86.6 80.0 - 100.0 fL   MCH 28.4 26.0 - 34.0 pg   MCHC 32.9 30.0 - 36.0 g/dL   RDW 13.6 11.5 - 15.5 %   Platelets 217 150 - 400 K/uL   nRBC 0.0 0.0 - 0.2 %    Comment: Performed at [facility], [place] [address], [place], [place] [address]  SARS Coronavirus 2 [facility] order, Performed in [facility] hospital lab) Nasopharyngeal Nasopharyngeal Swab     Status: None   Collection Time: 05/25/19 12:37 AM   Specimen: Nasopharyngeal Swab  Result Value Ref Range   SARS Coronavirus 2 NEGATIVE NEGATIVE    Comment: (NOTE) If result is NEGATIVE SARS-CoV-2 target nucleic acids are NOT DETECTED. The SARS-CoV-2 RNA is generally detectable in upper and lower  respiratory specimens during the acute phase of infection. The lowest  concentration of SARS-CoV-2 viral copies this assay can detect is 250  copies / mL. A negative result does not preclude SARS-CoV-2 infection  and should not be used as the sole basis for treatment or other  patient management decisions.  A negative result may occur with  improper specimen collection / handling, submission of specimen other  than nasopharyngeal swab, presence of viral mutation(s) within the  areas targeted by this assay, and inadequate number of viral copies  (<250 copies / mL). A negative result must be combined with clinical   observations, patient history, and epidemiological information. If result is POSITIVE SARS-CoV-2 target nucleic acids are DETECTED. The SARS-CoV-2 RNA is generally detectable in upper and lower  respiratory specimens dur ing the acute phase of infection.  Positive  results are indicative of active infection with SARS-CoV-2.  Clinical  correlation with patient history and other diagnostic information is  necessary to determine patient infection status.  Positive results do  not rule out bacterial infection or co-infection with other viruses. If result is PRESUMPTIVE POSTIVE SARS-CoV-2 nucleic acids MAY BE PRESENT.   A presumptive positive result was obtained on the submitted specimen  and confirmed on repeat testing.  While 2019 novel coronavirus  (SARS-CoV-2) nucleic acids may be present in the submitted sample  additional confirmatory testing may be necessary for epidemiological  and / or clinical management purposes  to differentiate between  SARS-CoV-2 and other Sarbecovirus currently known to infect humans.  If clinically indicated additional testing with an alternate test  methodology [phone]) is advised. The SARS-CoV-2 RNA is generally  detectable in upper and lower respiratory sp ecimens during the acute  phase of infection. The expected result is Negative. Fact Sheet for Patients:  StrictlyIdeas.no Fact Sheet for Healthcare Providers: BankingDealers.co.za This test is not yet approved or cleared by the [place] FDA and has been authorized for detection and/or diagnosis of SARS-CoV-2 by FDA under an Emergency Use Authorization (EUA).  This EUA will remain in effect (meaning this test can be used) for the duration of the COVID-19 declaration under Section 564(b)(1) of the Act, 21 U.S.C. section 360bbb-3(b)(1), unless the authorization is terminated or revoked sooner. Performed at [facility], [place]  [address] [place],  [address]     Medications:  Current Facility-Administered Medications  Medication Dose Route Frequency Provider Last Rate Last Dose  . lisinopril (ZESTRIL) tablet 10 mg  10 mg Oral Daily Antonietta Breach, PA-C   10 mg at 05/26/19 1058   Current Outpatient Medications  Medication Sig Dispense Refill  . Ascorbic Acid (VITAMIN C) 1000 MG tablet Take 1,000 mg by mouth daily.    . Multiple Vitamin (MULTIVITAMIN WITH MINERALS) TABS tablet Take 1 tablet by mouth daily.    Marland Kitchen oxyCODONE-acetaminophen (PERCOCET/ROXICET) 5-325 MG tablet Take 1 tablet by mouth every 8 (eight) hours as needed for severe pain. 15 tablet 0  . lisinopril (ZESTRIL) 10 MG tablet Take 1 tablet (10 mg total) by mouth daily. 90 tablet 1  . Melatonin 1 MG TABS Take 1 tablet (1 mg total) by mouth at bedtime. (Patient not taking: Reported on 05/24/2019) 30 tablet 11  . nitroGLYCERIN (NITROSTAT) 0.4 MG SL tablet Place 1 tablet (0.4 mg total) under the tongue every 5 (five) minutes as needed for chest pain. 25 tablet 1    Musculoskeletal: Strength & Muscle Tone: within normal limits Gait & Station: normal Patient leans: N/A  Psychiatric Specialty Exam: Physical Exam  ROS  Blood pressure (!) 144/87, pulse 71, temperature 98.4 F (36.9 C), temperature source Oral, resp. rate 16, height 5\' 10"  (1.778 m), weight 68 kg, SpO2 98 %.Body mass index is 21.52 kg/m.  General Appearance: Disheveled  Eye Contact:  Good  Speech:  Clear and Coherent  Volume:  Normal  Mood:  Anxious and Irritable  Affect:  Congruent  Thought Process:  Goal Directed and Descriptions of Associations: Intact  Orientation:  Full (Time, Place, and Person)  Thought Content:  Rumination  Suicidal Thoughts:  Denied any current suicidal ideations but prior to being brought in was asking police officers to shoot him  Homicidal Thoughts:  Yes.  with intent/plan. Was flashing a knife when the police found him  Memory:  Recent;   Fair   Judgement:  Poor  Insight:  poor  Psychomotor Activity:  Normal  Concentration:  Concentration: Fair  Recall:  [employer] of Knowledge:  Fair  Language:  Good  Akathisia:  No  Handed:  Right  AIMS (if indicated):     Assets:  Communication Skills  ADL's:  Intact  Cognition:  WNL  Sleep:   poor     Treatment Plan Summary: 58 y/o male with prior hx of substance use disorder now seen in the ED after being brought in by [employer] who IVC petitioned him after he called 911 and when [employer] arrived he swung a knife at them and told them to shoot him.Today, he denied any suicidal or homicidal ideations and minimized everything. His wife was contacted for collateral information and she informed that pt has been displaying frequent and intense mood outbursts and she is concerned about her safety around  him.   Disposition: Recommend psychiatric Inpatient admission when medically cleared.  Pt has been accepted to [facility].  This service was provided via telemedicine using a 2-way, interactive audio and video technology.  Names of all persons participating in this telemedicine service and their role in this encounter. Name: Dr. Toy Care Role: Psychiatrist  Name: [name]  Role: patient          [place] Crane, MD 05/26/2019 1:19 PM

## 2019-05-26 NOTE — Discharge Instructions (Signed)
For your behavioral health needs you are advised to follow up with [facility].  New patients are seen at their walk-in clinic.  Walk-in hours are Monday - Friday from 8:30 am - 12:00 pm, and from 1:00 pm - 2:30 pm.  Walk-in patients are seen on a first come, first served basis, so try to arrive as early as possible for the best chance of being seen the same day:       [facility]      [address]      [place], [place] [address]      [phone] 

## 2019-05-26 NOTE — ED Notes (Signed)
Pt belongings found and given to [employer] upon discharge

## 2019-05-27 DIAGNOSIS — R456 Violent behavior: Secondary | ICD-10-CM

## 2019-05-27 DIAGNOSIS — F23 Brief psychotic disorder: Principal | ICD-10-CM

## 2019-05-27 MED ORDER — ARIPIPRAZOLE 5 MG PO TABS
5.0000 mg | ORAL_TABLET | Freq: Every day | ORAL | Status: DC
  Administered 2019-05-27: 15:00:00 5 mg via ORAL
  Filled 2019-05-27 (×4): qty 1

## 2019-05-27 MED ORDER — OXYCODONE HCL 5 MG PO TABS
2.5000 mg | ORAL_TABLET | Freq: Every evening | ORAL | Status: DC | PRN
  Administered 2019-05-27 – 2019-05-28 (×2): 2.5 mg via ORAL
  Filled 2019-05-27 (×2): qty 1

## 2019-05-27 MED ORDER — LISINOPRIL 10 MG PO TABS
10.0000 mg | ORAL_TABLET | Freq: Every day | ORAL | Status: DC
  Administered 2019-05-27 – 2019-06-03 (×8): 10 mg via ORAL
  Filled 2019-05-27: qty 1
  Filled 2019-05-27: qty 7
  Filled 2019-05-27 (×4): qty 1
  Filled 2019-05-27: qty 2
  Filled 2019-05-27 (×4): qty 1
  Filled 2019-05-27: qty 2

## 2019-05-27 NOTE — Progress Notes (Signed)
D:  Patient's self inventory sheet, patient has good sleep, no sleep medicine.  Good appetite, normal energy level, good concentration.  Denied depression, hopeless and anxiety.  Denied withdrawals.  Checked runny nose.  Denied SI.  Denied physical problems.  Physical pain, wire defribrilator a little.  No pain medicine.  Goal is discharge.  Plans to do good and cooperative. "I am doing good."  No discharge plans. A:  Medications administered per MD orders.  Emotional support and encouragement given patient. R:  Denied SI and HI, contracts for safety.  Denied A/V hallucinations.  Safety maintained with 15 minute checks.

## 2019-05-27 NOTE — BHH Suicide Risk Assessment (Signed)
[facility] Admission Suicide Risk Assessment   Nursing information obtained from:  Patient Demographic factors:  Male, Caucasian Current Mental Status:  NA Loss Factors:  NA Historical Factors:  Impulsivity Risk Reduction Factors:  Sense of responsibility to family, Living with another person, especially a relative  Total Time spent with patient: 45 minutes Principal Problem: Psychosis Unspecified  Diagnosis:  Active Problems:   Brief psychotic disorder ([place])  Subjective Data:  Continued Clinical Symptoms:  Alcohol Use Disorder Identification Test Final Score (AUDIT): 4 The "Alcohol Use Disorders Identification Test", Guidelines for Use in Primary Care, Second Edition.  World Pharmacologist [facility]). Score between 0-7:  no or low risk or alcohol related problems. Score between 8-15:  moderate risk of alcohol related problems. Score between 16-19:  high risk of alcohol related problems. Score 20 or above:  warrants further diagnostic evaluation for alcohol dependence and treatment.   CLINICAL FACTORS:  58 year old male, presented to ED via [employer] under IVC.  Reportedly had contacted [id number] and when police arrived he was agitated, swinging a knife, asking to be shot.  In the ED he initially presented agitated, threatening, loud, angry.  Expressed delusional ideations. Today presents calm, not agitated but continues to present with delusional ideations. Denies depression or neuro-vegetative symptoms.    Musculoskeletal: Strength & Muscle Tone: within normal limits Gait & Station: normal Patient leans: N/A  Psychiatric Specialty Exam: Physical Exam  ROS  Blood pressure 125/90, pulse 72, temperature 97.8 F (36.6 C), temperature source Oral, resp. rate 18, height 5\' 10"  (1.778 m), weight 64.9 kg, SpO2 99 %.Body mass index is 20.52 kg/m.  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  There are no identifiable plans, no associated intent, mild dysphoria and related symptoms, good self-control (both objective and subjective assessment), few other risk factors, and identifiable protective factors, including available and accessible social support.  PLAN OF CARE: Patient will be admitted to inpatient psychiatric unit for stabilization and safety. Will provide and encourage milieu participation. Provide medication management and maked adjustments as needed.  Will follow daily.    I certify that inpatient services furnished can reasonably be expected to improve the patient's condition.   Jenne Campus, MD 05/27/2019, 1:50 PM

## 2019-05-27 NOTE — BHH Group Notes (Signed)
LCSW Group Therapy Note 05/27/2019 3:05 PM  Type of Therapy and Topic: Group Therapy: Overcoming Obstacles  Participation Level: Did Not Attend  Description of Group:  In this group patients will be encouraged to explore what they see as obstacles to their own wellness and recovery. They will be guided to discuss their thoughts, feelings, and behaviors related to these obstacles. The group will process together ways to cope with barriers, with attention given to specific choices patients can make. Each patient will be challenged to identify changes they are motivated to make in order to overcome their obstacles. This group will be process-oriented, with patients participating in exploration of their own experiences as well as giving and receiving support and challenge from other group members.  Therapeutic Goals: 1. Patient will identify personal and current obstacles as they relate to admission. 2. Patient will identify barriers that currently interfere with their wellness or overcoming obstacles.  3. Patient will identify feelings, thought process and behaviors related to these barriers. 4. Patient will identify two changes they are willing to make to overcome these obstacles:   Summary of Patient Progress  Invited, chose not to attend.     Therapeutic Modalities:  Cognitive Behavioral Therapy Solution Focused Therapy Motivational Interviewing Relapse Prevention Therapy   Theresa Duty Clinical Social Worker
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
## 2019-05-27 NOTE — Progress Notes (Signed)
Psychoeducational Group Note  Date:  05/27/2019 Time:  2325  Group Topic/Focus:  Wrap-Up Group:   The focus of this group is to help patients review their daily goal of treatment and discuss progress on daily workbooks.  Participation Level: Did Not Attend  Participation Quality:  Not Applicable  Affect:  Not Applicable  Cognitive:  Not Applicable  Insight:  Not Applicable  Engagement in Group: Not Applicable  Additional Comments: The patient did not attend group this evening.   Archie Balboa S 05/27/2019, 11:25 PM

## 2019-05-27 NOTE — BHH Group Notes (Signed)
Adult Psychoeducational Group Note  Date:  05/27/2019 Time:  9:52 AM  Group Topic/Focus:  Goals Group:   The focus of this group is to help patients establish daily goals to achieve during treatment and discuss how the patient can incorporate goal setting into their daily lives to aide in recovery.  Participation Level:  Active  Participation Quality:  Appropriate  Affect:  Appropriate  Cognitive:  Appropriate  Insight: Appropriate  Engagement in Group:  Engaged  Modes of Intervention:  Discussion  Additional Comments: Patient attended and participated in the goals group in which he shared that his goal for the day was to work on getting home and planned to do so by being good and cooperating.  Annie Sable 05/27/2019, 9:52 AM

## 2019-05-27 NOTE — Plan of Care (Signed)
Patient was mostly in room, isolative. Flat and depressed and avoiding to talk to staff. Alert and oriented. Denying suicidal thoughts. Denying hallucinations. No sign of distress. Staff continue to provide support and encouragements.

## 2019-05-27 NOTE — Progress Notes (Signed)
Patient slept throughout the night and woke up with a brighter affect. Pleasant this morning and visible in the milieu. Alert and oriented and talking to staff and peers. No sign of distress. Patient reports that he did not feel well yesterday but feeling better this morning. No issues to report.

## 2019-05-27 NOTE — Progress Notes (Signed)
Spiritual care group on grief and loss facilitated by chaplain Thiago Ragsdale  Group Goal:  Support / Education around grief and loss Members engage in facilitated group support and psycho-social education.  Group Description:  Following introductions and group rules, group members engaged in facilitated group dialog and support around topic of loss, with particular support around experiences of loss in their lives. Group Identified types of loss (relationships / self / things) and identified patterns, circumstances, and changes that precipitate losses. Reflected on thoughts / feelings around loss, normalized grief responses, and recognized variety in grief experience. Patient Progress:  

## 2019-05-28 DIAGNOSIS — T798XXA Other early complications of trauma, initial encounter: Secondary | ICD-10-CM

## 2019-05-28 DIAGNOSIS — F600 Paranoid personality disorder: Secondary | ICD-10-CM

## 2019-05-28 DIAGNOSIS — F062 Psychotic disorder with delusions due to known physiological condition: Secondary | ICD-10-CM

## 2019-05-28 LAB — LIPID PANEL
Cholesterol: 138 mg/dL (ref 0–200)
HDL: 41 mg/dL (ref >40)
LDL Cholesterol: 87 mg/dL (ref 0–99)
Total CHOL/HDL Ratio: 3.4 RATIO
Triglycerides: 48 mg/dL (ref <150)
VLDL: 10 mg/dL (ref 0–40)

## 2019-05-28 LAB — HEMOGLOBIN A1C
Hgb A1c MFr Bld: 5.5 % (ref 4.8–5.6)
Mean Plasma Glucose: 111.15 mg/dL

## 2019-05-28 LAB — TSH: TSH: 1.866 u[IU]/mL (ref 0.350–4.500)

## 2019-05-28 MED ORDER — ARIPIPRAZOLE 10 MG PO TABS
10.0000 mg | ORAL_TABLET | Freq: Every day | ORAL | Status: DC
  Filled 2019-05-28: qty 1

## 2019-05-28 MED ORDER — PERPHENAZINE 4 MG PO TABS
4.0000 mg | ORAL_TABLET | Freq: Two times a day (BID) | ORAL | Status: DC
  Filled 2019-05-28 (×5): qty 1

## 2019-05-28 MED ORDER — DEXTROMETHORPHAN-QUINIDINE 20-10 MG PO CAPS
1.0000 | ORAL_CAPSULE | Freq: Two times a day (BID) | ORAL | Status: DC
  Administered 2019-06-02 – 2019-06-03 (×3): 1 via ORAL
  Filled 2019-05-28 (×4): qty 1
  Filled 2019-05-28: qty 6
  Filled 2019-05-28 (×5): qty 1
  Filled 2019-05-28: qty 6
  Filled 2019-05-28 (×6): qty 1

## 2019-05-28 MED ORDER — OLANZAPINE 5 MG PO TBDP
5.0000 mg | ORAL_TABLET | Freq: Three times a day (TID) | ORAL | Status: DC | PRN
  Administered 2019-05-30: 5 mg via ORAL
  Filled 2019-05-28 (×2): qty 1

## 2019-05-28 MED ORDER — ZIPRASIDONE MESYLATE 20 MG IM SOLR
20.0000 mg | INTRAMUSCULAR | Status: AC | PRN
  Administered 2019-05-31: 20 mg via INTRAMUSCULAR
  Filled 2019-05-28: qty 20

## 2019-05-28 MED ORDER — LORAZEPAM 1 MG PO TABS
1.0000 mg | ORAL_TABLET | ORAL | Status: AC | PRN
  Administered 2019-05-28: 23:00:00 1 mg via ORAL
  Filled 2019-05-28: qty 1

## 2019-05-28 NOTE — Plan of Care (Signed)
Stayed in the milieu, pleasant and cooperative. Participated in group activities . Denying thoughts of self harm. Denying hallucinations. Had medications and snack. Currently in bed sleeping. No sign of distress.

## 2019-05-28 NOTE — Progress Notes (Addendum)
[facility] MD Progress Note  05/28/2019 10:42 AM [name] [name]  MRN:  [id number] Subjective:    Patient moved to the 500 hall due to behavioral outbursts and threats towards clinicians and staff.  Case is reviewed, notes reviewed, discussed diagnostic possibilities with patient.  Patient was explosive and yelling at Dr. Parke Poisson today threatening him and further did the same towards 1 of the staff members, he seemed to be paranoid about a casual remark she made but it was not to him  The patient himself is very delusional he states "I am working on my case" and by his case he believes the [place] is going to call "the doctor today" because federal marshals or stealing children and "I was in the back of a police car with [id number] of needles in my arm" and believes that this examiner is simply "not up on the news" because I am unaware of this case, the More[place] Rankins for federal marshals and related criminal activity.  Clearly has an extensive delusional system that is impenetrable at the present time.  I explained to the patient that his anoxic/hypoxic injury as mimic to traumatic brain injury as far as the neuropsychiatric sequelae and is exacerbated an underlying paranoid condition that was more prodromal prior to this incident as best I can tell from his chart.  The patient is paradoxically cordial yet argumentative and believes that these are not the issues he is facing that he simply needs to get on with his legal case, that he only wants to take Abilify and does not want any other medications.  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 Anemia   . Cardiac arrest ([place]) 12/2014  . Depression    Resolved- pt contributes to previous drug use  . Hemorrhoids   . Hepatitis C    S/p completing treatment at [facility]  . IV drug abuse ([place])    Quit. Used in the 52s  . PTSD (post-traumatic stress disorder)    Found deceased brother- resolved per pt  . PULMONARY NODULE 12/21/2009   Stable 8mm per repeat CXR 4/11  . Substance abuse ([place])   . Tobacco abuse    1 ppd x 70 ys. Quit 04/22/12    Past Surgical History:  Procedure Laterality Date  . CARDIAC CATHETERIZATION N/A 01/26/2015   Procedure: Left Heart Cath and Coronary Angiography;  Surgeon: Leonie Man, MD;  Location: [place] [facility];  Service: Cardiovascular;  Laterality: N/A;  . CYST REMOVAL NECK  2011  . EP IMPLANTABLE DEVICE N/A 01/29/2015   Procedure: SubQ ICD Implant;  Surgeon: Deboraha Sprang, MD;  Location: [place] [facility];  Service: Cardiovascular;  Laterality:  N/A;  . FRACTURE SURGERY Left    In high school  . MULTIPLE TOOTH EXTRACTIONS  1999  . WISDOM TOOTH EXTRACTION     Family History:  Family History  Problem Relation Age of Onset  . Hyperlipidemia Mother   . Hypertension Mother   . COPD Mother   . Colon polyps Mother 66  . Heart disease Father        Decease from MI at 27yo  . Cancer Maternal Grandfather        Colon cancer, deceased  . Alcohol abuse Maternal Grandfather   . Colon cancer Maternal Grandfather 32  . Esophageal  cancer Neg Hx   . Rectal cancer Neg Hx    Family Psychiatric  History: Patient denies Social History:  Social History   Substance and Sexual Activity  Alcohol Use Yes  . Alcohol/week: 6.0 standard drinks  . Types: 6 Cans of beer per week   Comment: occassional     Social History   Substance and Sexual Activity  Drug Use Yes  . Types: "Crack" cocaine, Marijuana, Heroin   Comment: Quit IV drugs in the '80s. Last crack use 2009 or 2010.    Social History   Socioeconomic History  . Marital status: Married    Spouse name: Not on file  . Number of children: Not on file  . Years of education: Not on file  . Highest education level: Not on file  Occupational History  . Not on file  Social Needs  . Financial resource strain: Not on file  . Food insecurity    Worry: Not on file    Inability: Not on file  . Transportation needs    Medical: Not on file    Non-medical: Not on file  Tobacco Use  . Smoking status: Current Every Day Smoker    Packs/day: 0.50    Years: 29.00    Pack years: 14.50    Types: Cigarettes  . Smokeless tobacco: Never Used  . Tobacco comment: 5-10 cigs per day  Substance and Sexual Activity  . Alcohol use: Yes    Alcohol/week: 6.0 standard drinks    Types: 6 Cans of beer per week    Comment: occassional  . Drug use: Yes    Types: "Crack" cocaine, Marijuana, Heroin    Comment: Quit IV drugs in the '80s. Last crack use 2009 or 2010.  Marland Kitchen Sexual activity: Yes    Partners: Female  Lifestyle  . Physical activity    Days per week: Not on file    Minutes per session: Not on file  . Stress: Not on file  Relationships  . Social Herbalist on phone: Not on file    Gets together: Not on file    Attends religious service: Not on file    Active member of club or organization: Not on file    Attends meetings of clubs or organizations: Not on file    Relationship status: Not on file  Other Topics Concern  . Not on file  Social History Narrative    ** Merged History Encounter **    Pt lives in [place].  He stated that his wife left him, but then stated that wife returned home.  Pt denied outpatient provider.   Additional Social History:                         Sleep: Good  Appetite:  Good  Current Medications: Current  Facility-Administered Medications  Medication Dose Route Frequency Provider Last Rate Last Dose  . Dextromethorphan-quiNIDine (NUEDEXTA) 20-10 MG per capsule 1 capsule  1 capsule Oral BID Johnn Hai, MD      . lisinopril (ZESTRIL) tablet 10 mg  10 mg Oral Daily Cobos, Myer Peer, MD   10 mg at 05/28/19 0912  . OLANZapine zydis (ZYPREXA) disintegrating tablet 5 mg  5 mg Oral Q8H PRN Cobos, Myer Peer, MD       And  . LORazepam (ATIVAN) tablet 1 mg  1 mg Oral PRN Cobos, Myer Peer, MD       And  . ziprasidone (GEODON) injection 20 mg  20 mg Intramuscular PRN Cobos, Myer Peer, MD      . nicotine (NICODERM CQ - dosed in mg/24 hours) patch 21 mg  21 mg Transdermal Daily Sharma Covert, MD      . nitroGLYCERIN (NITROSTAT) SL tablet 0.4 mg  0.4 mg Sublingual Q5 min PRN Rankin, Shuvon B, NP      . oxyCODONE (Oxy IR/ROXICODONE) immediate release tablet 2.5 mg  2.5 mg Oral QHS PRN Cobos, Myer Peer, MD   2.5 mg at 05/27/19 2139  . [START ON 05/29/2019] perphenazine (TRILAFON) tablet 4 mg  4 mg Oral BID Johnn Hai, MD        Lab Results:  Results for orders placed or performed during the hospital encounter of 05/26/19 (from the past 48 hour(s))  Hemoglobin A1c     Status: None   Collection Time: 05/28/19  6:23 AM  Result Value Ref Range   Hgb A1c MFr Bld 5.5 4.8 - 5.6 %    Comment: (NOTE) Pre diabetes:          5.7%-6.4% Diabetes:              >6.4% Glycemic control for   <7.0% adults with diabetes    Mean Plasma Glucose 111.15 mg/dL    Comment: Performed at [facility], [place] [address], [place], [place] [address]  Lipid panel     Status: None   Collection Time: 05/28/19  6:23 AM   Result Value Ref Range   Cholesterol 138 0 - 200 mg/dL   Triglycerides 48 <150 mg/dL   HDL 41 >40 mg/dL   Total CHOL/HDL Ratio 3.4 RATIO   VLDL 10 0 - 40 mg/dL   LDL Cholesterol 87 0 - 99 mg/dL    Comment:        Total Cholesterol/HDL:CHD Risk Coronary Heart Disease Risk Table                     Men   Women  1/2 Average Risk   3.4   3.3  Average Risk       5.0   4.4  2 X Average Risk   9.6   7.1  3 X Average Risk  23.4   11.0        Use the calculated Patient Ratio above and the CHD Risk Table to determine the patient's CHD Risk.        ATP III CLASSIFICATION (LDL):  <100     mg/dL   Optimal  100-129  mg/dL   Near or Above                    Optimal  130-159  mg/dL   Borderline  160-189  mg/dL   High  >190     mg/dL   Very High Performed  at [facility], [place] [address], [place], [place] [address]   TSH     Status: None   Collection Time: 05/28/19  6:23 AM  Result Value Ref Range   TSH 1.866 0.350 - 4.500 uIU/mL    Comment: Performed by a 3rd Generation assay with a functional sensitivity of <=0.01 uIU/mL. Performed at [facility], [place] [address], [place], [place] [address]     Blood Alcohol level:  Lab Results  Component Value Date   ETH <10 [id number]    Metabolic Disorder Labs: Lab Results  Component Value Date   HGBA1C 5.5 05/28/2019   MPG 111.15 05/28/2019   MPG 114 01/28/2015   No results found for: PROLACTIN Lab Results  Component Value Date   CHOL 138 05/28/2019   TRIG 48 05/28/2019   HDL 41 05/28/2019   CHOLHDL 3.4 05/28/2019   VLDL 10 05/28/2019   LDLCALC 87 05/28/2019   LDLCALC 82 01/28/2015    Physical Findings: AIMS: Facial and Oral Movements Muscles of Facial Expression: None, normal Lips and Perioral Area: None, normal Jaw: None, normal Tongue: None, normal,Extremity Movements Upper (arms, wrists, hands, fingers): None, normal Lower (legs, knees, ankles, toes): None, normal, Trunk  Movements Neck, shoulders, hips: None, normal, Overall Severity Severity of abnormal movements (highest score from questions above): None, normal Incapacitation due to abnormal movements: None, normal Patient's awareness of abnormal movements (rate only patient's report): No Awareness, Dental Status Current problems with teeth and/or dentures?: No Does patient usually wear dentures?: No  CIWA:  CIWA-Ar Total: 1 COWS:  COWS Total Score: 1  Musculoskeletal: Strength & Muscle Tone: within normal limits Gait & Station: normal Patient leans: N/A  Psychiatric Specialty Exam: Physical Exam  Nursing note and vitals reviewed. Constitutional: He appears well-developed and well-nourished.  Cardiovascular: Normal rate and regular rhythm.    Review of Systems  Constitutional: Negative.   HENT: Negative.   Eyes: Negative.   Respiratory: Negative.   Cardiovascular: Negative.   Gastrointestinal: Negative.   Genitourinary: Negative.   Musculoskeletal: Negative.   Skin: Negative.   Neurological: Negative.   Endo/Heme/Allergies: Negative.   Positive for underlying cardiac disease/positive for anoxic/hypoxic injury from cardiac arrest  Blood pressure (!) 128/96, pulse 86, temperature 98.1 F (36.7 C), temperature source Oral, resp. rate 18, height 5\' 10"  (1.778 m), weight 64.9 kg, SpO2 99 %.Body mass index is 20.52 kg/m.  General Appearance: Casual  Eye Contact:  Fair  Speech:  Clear and Coherent  Volume:  Normal  Mood:  Highly variable from moment to moment currently calm and cooperative but can have outbursts of anger that are irrational  Affect:  Congruent  Thought Process:  Goal Directed and Descriptions of Associations: Circumstantial  Orientation:  Full (Time, Place, and Person)  Thought Content:  Illogical, Delusions and Paranoid Ideation  Suicidal Thoughts:  No  Homicidal Thoughts:  No  Memory:  Immediate;   Poor Recent;   Fair Remote;   Fair  Judgement:  Impaired  Insight:   Lacking  Psychomotor Activity:  Normal  Concentration:  Concentration: Poor and Attention Span: Poor  Recall: Compromised by delusional beliefs and conflating delusional material into real history  Fund of Knowledge:  Fair  Language:  Fair  Akathisia:  Negative  Handed:  Right  AIMS (if indicated):     Assets:  Leisure Time Resilience Social Support  ADL's:  Intact  Cognition:  WNL  Sleep:  Number of Hours: 6.5     Treatment Plan  Summary: Daily contact with patient to assess and evaluate symptoms and progress in treatment and Medication management  Discussed issues regarding his diagnosis, patient is cordial but argumentative as discussed refuses the med changes that I have ordered but we will order them for tomorrow and hopefully he will comply.  Continue to monitor in the 500 hall under 15-minute precautions.  Agitation protocol ordered.  No change in precautions now though and continue to encourage compliance continue to seek diagnostic clarity continue cognitive and reality-based therapies  Johnn Hai, MD 05/28/2019, 10:42 AM

## 2019-05-28 NOTE — Progress Notes (Signed)
DAR NOTE: Patient presents with irritable affect and agitated mood.  Denies suicidal thoughts, auditory and visual hallucinations.  Verbally aggressive and abusive towards staff and peers at the beginning of this shift.  Needed a lot of redirection during milieu.  Transferred to 500 hall for safety.  Rates depression at 0, hopelessness at 0, and anxiety at 0.  Maintained on routine safety checks.  Medications given as prescribed.  Support and encouragement offered as needed.   States goal for today is "getting home and to work."  Patient visible in milieu with minimal interaction.  Patient is safe on and off the unit.

## 2019-05-28 NOTE — Progress Notes (Signed)
Pt up to nursing station complaining of anxiety, given PRN Ativan per [facility]

## 2019-05-28 NOTE — Progress Notes (Signed)
Adult Psychoeducational Group Note  Date:  05/28/2019 Time:  9:48 PM  Group Topic/Focus:  Wrap-Up Group:   The focus of this group is to help patients review their daily goal of treatment and discuss progress on daily workbooks.  Participation Level:  Active  Participation Quality:  Appropriate  Affect:  Appropriate  Cognitive:  Appropriate  Insight: Appropriate  Engagement in Group:  Engaged  Modes of Intervention:  Education and Support  Additional Comments:  Patient attended and participated in group tonight. He reports that is day was alright. He moved to a new unit, went outside, and he is hoping to be discharged soon.  Salley Scarlet [facility] 05/28/2019, 9:48 PM

## 2019-05-29 DIAGNOSIS — F3489 Other specified persistent mood disorders: Secondary | ICD-10-CM

## 2019-05-29 MED ORDER — LORAZEPAM 2 MG/ML IJ SOLN
4.0000 mg | Freq: Once | INTRAMUSCULAR | Status: AC
  Administered 2019-05-29: 10:00:00 4 mg via INTRAMUSCULAR

## 2019-05-29 MED ORDER — LORAZEPAM 2 MG/ML IJ SOLN
INTRAMUSCULAR | Status: AC
  Filled 2019-05-29: qty 2

## 2019-05-29 MED ORDER — HALOPERIDOL LACTATE 5 MG/ML IJ SOLN
INTRAMUSCULAR | Status: AC
  Administered 2019-05-29: 10 mg via INTRAMUSCULAR
  Filled 2019-05-29: qty 2

## 2019-05-29 MED ORDER — HALOPERIDOL LACTATE 5 MG/ML IJ SOLN
10.0000 mg | Freq: Once | INTRAMUSCULAR | Status: AC
  Administered 2019-05-29: 10:00:00 10 mg via INTRAMUSCULAR
  Filled 2019-05-29: qty 2

## 2019-05-29 MED ORDER — LORAZEPAM 2 MG/ML IJ SOLN
INTRAMUSCULAR | Status: AC
  Administered 2019-05-29: 10:00:00 4 mg via INTRAMUSCULAR
  Filled 2019-05-29: qty 1

## 2019-05-29 NOTE — Progress Notes (Signed)
Patient ID: [name] [name], male   DOB: 1960/11/28, 58 y.o.   MRN: [id number]   CSW was contacted by pt's wife. Pt's wife asked about his medications and wanted to talk to the doctor. CSW transferred pt's wife to the doctor's extension.  Pt's wife called back and asked that the CSW ask the pt's doctor to call her because she has several questions for him regarding medication and the patient's progress.
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
## 2019-05-29 NOTE — [facility] Suicide Risk Assessment (Signed)
[facility] INPATIENT:  Family/Significant Other Suicide Prevention Education  Suicide Prevention Education:  Education Completed; Pt's mother, [name] [name],  has been identified by the patient as the family member/significant other with whom the patient will be residing, and identified as the person(s) who will aid the patient in the event of a mental health crisis (suicidal ideations/suicide attempt).  With written consent from the patient, the family member/significant other has been provided the following suicide prevention education, prior to the and/or following the discharge of the patient.  The suicide prevention education provided includes the following:  Suicide risk factors  Suicide prevention and interventions  National Suicide Hotline telephone number  [facility] assessment telephone number  [facility] Emergency Assistance [place] and/or Residential Mobile Crisis Unit telephone number  Request made of family/significant other to:  Remove weapons (e.g., guns, rifles, knives), all items previously/currently identified as safety concern.    Remove drugs/medications (over-the-counter, prescriptions, illicit drugs), all items previously/currently identified as a safety concern.  The family member/significant other verbalizes understanding of the suicide prevention education information provided.  The family member/significant other agrees to remove the items of safety concern listed above.  CSW contacted pt's mother, [name] [name]. Pt's mother stated that she wishes that he gets something to help him level him out. She states that he has high and lows. She states that he does not sleep at night very well and discussed his recent heart attack. She states that since the heart attack he upset easily. Pt's mother states that him and his wife have a lot of problems because his wife also has mental health issues. Pt's mother states that his wife will lie a lot and  he believes the lies which does not help. Pt's mother states that the patient does not have any weapons.   Trecia Rogers 05/29/2019, 11:51 AM

## 2019-05-29 NOTE — Progress Notes (Signed)
D: Pt been in room majority of the night.  A: Pt was offered support and encouragement.  Pt was encourage to attend groups. Q 15 minute checks were done for safety.  R: safety maintained on unit.

## 2019-05-29 NOTE — Progress Notes (Signed)
D: Pt denies SI/HI/AVH. Pt is pleasant and cooperative. Pt visible in the dayroom A: Pt was offered support and encouragement. Pt was given scheduled medications. Pt was encourage to attend groups. Q 15 minute checks were done for safety.  R: safety maintained on unit.

## 2019-05-29 NOTE — Progress Notes (Addendum)
[facility] MD Progress Note  05/29/2019 10:04 AM [name] [name]  MRN:  [id number] Subjective:    Patient remains extremely delusional, believes that the "governor and [employer]" are friends of his and he can pick up the phone and call them, he believes that the sheriff is going to "pay the price" for all of the dead bodies.  He continues to state various bizarre delusions of extreme criminal behavior by law enforcement and believes that he has no mental illness  When explained to him that I believe his condition is a can to traumatic brain injury due to the extensive time which would allow for anoxic hypoxic injury he screams "Jesus Christ was breathing for me" and claims "you are not smarter than Jesus" he then becomes extremely hostile stating he will not take medication, yelling at the examiner calling them various profane names and insults.  He states "somebody is going to jump you will" and "I am going to jump you" so he begins threatening he does leave the room though only to be again banging on the window and continue his threats.  He then leaves and goes to the day room. (calls me a "stupid piece of shit! Asshole! gonna jump on you !! Says "fuck" a lot...)  He also made direct threats towards Dr. Parke Poisson.  Patient will need forced medication as again he is threatening both psychiatrist here, very staff members, yelling uncontrollably/family/threateningly. 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 Anemia   . Cardiac arrest ([place]) 12/2014  . Depression    Resolved- pt contributes to previous drug use  . Hemorrhoids   . Hepatitis C    S/p completing treatment at [facility]  . IV drug abuse ([place])    Quit. Used in the 43s  . PTSD (post-traumatic stress disorder)    Found deceased brother- resolved per pt  . PULMONARY NODULE 12/21/2009   Stable 45mm per repeat CXR 4/11  . Substance abuse ([place])   . Tobacco abuse    1 ppd x 35 ys. Quit 04/22/12    Past Surgical History:  Procedure Laterality Date  . CARDIAC CATHETERIZATION N/A 01/26/2015   Procedure: Left Heart Cath and Coronary Angiography;  Surgeon: Leonie Man, MD;  Location: [place] [facility];  Service: Cardiovascular;  Laterality: N/A;  . CYST REMOVAL NECK  2011  . EP IMPLANTABLE DEVICE N/A 01/29/2015   Procedure: SubQ ICD Implant;  Surgeon: Deboraha Sprang, MD;  Location: [place] [facility];  Service: Cardiovascular;  Laterality: N/A;  . FRACTURE SURGERY Left    In high school  . MULTIPLE TOOTH EXTRACTIONS  1999  . WISDOM TOOTH EXTRACTION     Family History:  Family History  Problem Relation Age of Onset  . Hyperlipidemia Mother   . Hypertension Mother   . COPD Mother   . Colon polyps Mother 66  . Heart disease Father        Decease from MI at 63yo  . Cancer Maternal Grandfather        Colon cancer, deceased  . Alcohol abuse Maternal Grandfather   . Colon cancer Maternal Grandfather 43  . Esophageal cancer Neg Hx   . Rectal cancer Neg Hx    Family Psychiatric  History: Unknown Social History:  Social History   Substance and Sexual Activity  Alcohol Use Yes  . Alcohol/week: 6.0 standard drinks  . Types: 6 Cans of beer per week   Comment: occassional     Social History   Substance and Sexual Activity  Drug Use Yes  . Types: "Crack" cocaine, Marijuana, Heroin   Comment: Quit IV drugs in the '80s. Last crack use 2009 or 2010.    Social History   Socioeconomic History  . Marital status: Married    Spouse name: Not on file  . Number of children: Not on file  . Years of education: Not on file  . Highest education level: Not on file  Occupational History  . Not on file  Social Needs  . Financial resource  strain: Not on file  . Food insecurity    Worry: Not on file    Inability: Not on file  . Transportation needs    Medical: Not on file    Non-medical: Not on file  Tobacco Use  . Smoking status: Current Every Day Smoker    Packs/day: 0.50    Years: 29.00    Pack years: 14.50    Types: Cigarettes  . Smokeless tobacco: Never Used  . Tobacco comment: 5-10 cigs per day  Substance and Sexual Activity  . Alcohol use: Yes    Alcohol/week: 6.0 standard drinks    Types: 6 Cans of beer per week    Comment: occassional  . Drug use: Yes    Types: "Crack" cocaine, Marijuana, Heroin    Comment: Quit IV drugs in the '80s. Last crack use 2009 or 2010.  Marland Kitchen Sexual activity: Yes    Partners: Female  Lifestyle  . Physical activity    Days per week: Not on file    Minutes per session: Not on file  . Stress: Not on file  Relationships  . Social Herbalist on phone: Not on file    Gets together: Not on file    Attends religious service: Not on file    Active member of club or organization: Not on file    Attends meetings of clubs or organizations: Not on file    Relationship status: Not on file  Other Topics Concern  . Not on file  Social History Narrative   ** Merged History Encounter **    Pt lives in [place].  He stated that his wife left him, but then stated that wife returned home.  Pt denied outpatient provider.   Additional Social History:                         Sleep: Fair  Appetite:  Fair  Current Medications: Current Facility-Administered Medications  Medication Dose Route Frequency Provider Last Rate Last Dose  . haloperidol lactate (HALDOL) 5 MG/ML injection           . LORazepam (ATIVAN) 2 MG/ML injection           . LORazepam (ATIVAN) 2 MG/ML injection           . Dextromethorphan-quiNIDine (NUEDEXTA) 20-10 MG per capsule 1 capsule  1 capsule Oral BID Johnn Hai, MD      . haloperidol lactate (HALDOL) injection 10 mg  10 mg Intramuscular Once  Johnn Hai, MD      . lisinopril (ZESTRIL) tablet 10 mg  10 mg Oral Daily Cobos, Myer Peer, MD   10 mg at 05/29/19 0818  .  LORazepam (ATIVAN) injection 4 mg  4 mg Intramuscular Once Johnn Hai, MD      . nicotine (NICODERM CQ - dosed in mg/24 hours) patch 21 mg  21 mg Transdermal Daily Sharma Covert, MD      . nitroGLYCERIN (NITROSTAT) SL tablet 0.4 mg  0.4 mg Sublingual Q5 min PRN Rankin, Shuvon B, NP      . OLANZapine zydis (ZYPREXA) disintegrating tablet 5 mg  5 mg Oral Q8H PRN Cobos, Myer Peer, MD       And  . ziprasidone (GEODON) injection 20 mg  20 mg Intramuscular PRN Cobos, Myer Peer, MD      . perphenazine (TRILAFON) tablet 4 mg  4 mg Oral BID Johnn Hai, MD        Lab Results:  Results for orders placed or performed during the hospital encounter of 05/26/19 (from the past 48 hour(s))  Hemoglobin A1c     Status: None   Collection Time: 05/28/19  6:23 AM  Result Value Ref Range   Hgb A1c MFr Bld 5.5 4.8 - 5.6 %    Comment: (NOTE) Pre diabetes:          5.7%-6.4% Diabetes:              >6.4% Glycemic control for   <7.0% adults with diabetes    Mean Plasma Glucose 111.15 mg/dL    Comment: Performed at [facility], [place]. [address] [place], [place] [address]  Lipid panel     Status: None   Collection Time: 05/28/19  6:23 AM  Result Value Ref Range   Cholesterol 138 0 - 200 mg/dL   Triglycerides 48 <150 mg/dL   HDL 41 >40 mg/dL   Total CHOL/HDL Ratio 3.4 RATIO   VLDL 10 0 - 40 mg/dL   LDL Cholesterol 87 0 - 99 mg/dL    Comment:        Total Cholesterol/HDL:CHD Risk Coronary Heart Disease Risk Table                     Men   Women  1/2 Average Risk   3.4   3.3  Average Risk       5.0   4.4  2 X Average Risk   9.6   7.1  3 X Average Risk  23.4   11.0        Use the calculated Patient Ratio above and the CHD Risk Table to determine the patient's CHD Risk.        ATP III CLASSIFICATION (LDL):  <100     mg/dL   Optimal  100-129  mg/dL   Near or  Above                    Optimal  130-159  mg/dL   Borderline  160-189  mg/dL   High  >190     mg/dL   Very High Performed at [facility] [address] [place], [place] [address]   TSH     Status: None   Collection Time: 05/28/19  6:23 AM  Result Value Ref Range   TSH 1.866 0.350 - 4.500 uIU/mL    Comment: Performed by a 3rd Generation assay with a functional sensitivity of <=0.01 uIU/mL. Performed at [facility], [place] [address] [place], [place] [address]     Blood Alcohol level:  Lab Results  Component Value Date   ETH <10 05/24/2019  Metabolic Disorder Labs: Lab Results  Component Value Date   HGBA1C 5.5 05/28/2019   MPG 111.15 05/28/2019   MPG 114 01/28/2015   No results found for: PROLACTIN Lab Results  Component Value Date   CHOL 138 05/28/2019   TRIG 48 05/28/2019   HDL 41 05/28/2019   CHOLHDL 3.4 05/28/2019   VLDL 10 05/28/2019   LDLCALC 87 05/28/2019   LDLCALC 82 01/28/2015    Physical Findings: AIMS: Facial and Oral Movements Muscles of Facial Expression: None, normal Lips and Perioral Area: None, normal Jaw: None, normal Tongue: None, normal,Extremity Movements Upper (arms, wrists, hands, fingers): None, normal Lower (legs, knees, ankles, toes): None, normal, Trunk Movements Neck, shoulders, hips: None, normal, Overall Severity Severity of abnormal movements (highest score from questions above): None, normal Incapacitation due to abnormal movements: None, normal Patient's awareness of abnormal movements (rate only patient's report): No Awareness, Dental Status Current problems with teeth and/or dentures?: No Does patient usually wear dentures?: No  CIWA:  CIWA-Ar Total: 1 COWS:  COWS Total Score: 1  Musculoskeletal: Strength & Muscle Tone: within normal limits Gait & Station: normal Patient leans: N/A  Psychiatric Specialty Exam: Physical Exam  Nursing note and vitals reviewed. Constitutional: He  appears well-developed and well-nourished.  Cardiovascular: Normal rate and regular rhythm.    Review of Systems  Constitutional: Negative.   Musculoskeletal: Negative.   Neurological: Negative.   Endo/Heme/Allergies: Negative.   Continues to deny any illness and in particular neurological injury  Blood pressure (!) 129/100, pulse 89, temperature 98.2 F (36.8 C), temperature source Oral, resp. rate 18, height 5\' 10"  (1.778 m), weight 64.9 kg, SpO2 99 %.Body mass index is 20.52 kg/m.  General Appearance: Casual  Eye Contact:  Fair  Speech:  Pressured  Volume:  Increased  Mood:  Angry, Irritable and Hostile and threatening  Affect:  Congruent and Labile  Thought Process:  Irrelevant and Descriptions of Associations: Loose  Orientation:  Full (Time, Place, and Person)  Thought Content:  Illogical, Delusions, Obsessions and Paranoid Ideation  Suicidal Thoughts:  No  Homicidal Thoughts:  Yes.  with intent/plan  Memory:  Immediate;   Poor Recent;   Poor Remote;   Fair  Judgement:  Impaired  Insight:  Lacking  Psychomotor Activity:  Increased  Concentration:  Concentration: Poor  Recall:  Poor  Fund of Knowledge:  Fair  Language:  Profane threatening  Akathisia:  Negative  Handed:  Right  AIMS (if indicated):     Assets:  Physical Health Resilience Social Support  ADL's:  Intact  Cognition: The oriented is lacking complete insight and volatile  Sleep:  Number of Hours: 6.5     Treatment Plan Summary: Daily contact with patient to assess and evaluate symptoms and progress in treatment and Medication management  As discussed patient has threatened me and has become extremely volatile just with a routine mental status exam and basic discussion of his diagnosis.  We have ordered manpower, as is not safe for me to leave the office at this point in time  We have ordered IM Haldol and Ativan hopefully patient will begin complying but he is in need of force medications due to  direct threats/volatility and a complete lack of insight and an inability to modulate his mood states and volatility  Ewell Benassi, MD 05/29/2019, 10:04 AM

## 2019-05-29 NOTE — Progress Notes (Signed)
Recreation Therapy Notes  Date: 10.1.20 Time: [id number] Location: [place]   Group Topic: Leisure Education  Goal Area(s) Addresses:  Patient will identify positive leisure activities.  Patient will identify one positive benefit of participation in leisure activities.   Behavioral Response: Engaged  Intervention: Leisure Group Game  Activity: Pictionary.  LRT and patients played a game of Pictionary.  One person would pick a word from the container and draw what the word represents on the board.  The remaining participants get one minute to guess what the picture is.  The person who guesses correctly gets the next turn.  Education:  Leisure Education, Dentist  Education Outcome: Acknowledges education/In group clarification offered/Needs additional education  Clinical Observations/Feedback: Pt was energetic but appropriate.  Pt was engaged and did a good job of participating in the activity.  Pt was respectful to peers throughout activity.    Victorino Sparrow, LRT/CTRS     Ria Comment, Rogen Porte A 05/29/2019 11:11 AM

## 2019-05-29 NOTE — BHH Group Notes (Signed)
[facility] LCSW Group Therapy Note   Date and Time: 05/29/2019 @ 1:30pm  Type of Group and Topic: Psychoeducational Group: Discharge Planning  Participation Level: BHH PARTICIPATION LEVEL: Did Not Attend  Mood: Did not attend    Description of Group: Discharge planning group reviews patient's anticipated discharge plans and assists patients to anticipate and address any barriers to wellness/recovery in the community. Suicide prevention education is reviewed with patients in group. Therapeutic Goals 1. Patients will state their anticipated discharge plan and mental health aftercare 2. Patients will identify potential barriers to wellness in the community setting 3. Patients will engage in problem solving, solution focused discussion of ways to anticipate and address barriers to wellness/recovery     Summary of Patient Progress:  Patient did not attend group therapy today.    Plan for Discharge/Comments:    Transportation Means:    Supports:     Therapeutic Modalities: Motivational Interviewing

## 2019-05-30 MED ORDER — PERPHENAZINE 4 MG PO TABS
8.0000 mg | ORAL_TABLET | Freq: Two times a day (BID) | ORAL | Status: DC
  Administered 2019-05-30 – 2019-06-03 (×7): 8 mg via ORAL
  Filled 2019-05-30: qty 1
  Filled 2019-05-30: qty 28
  Filled 2019-05-30 (×7): qty 1
  Filled 2019-05-30: qty 28
  Filled 2019-05-30 (×2): qty 1

## 2019-05-30 MED ORDER — LORAZEPAM 2 MG/ML IJ SOLN
2.0000 mg | Freq: Two times a day (BID) | INTRAMUSCULAR | Status: DC | PRN
  Administered 2019-05-31: 2 mg via INTRAMUSCULAR
  Filled 2019-05-30: qty 1

## 2019-05-30 MED ORDER — HALOPERIDOL LACTATE 5 MG/ML IJ SOLN
10.0000 mg | Freq: Two times a day (BID) | INTRAMUSCULAR | Status: DC | PRN
  Administered 2019-05-31: 08:00:00 10 mg via INTRAMUSCULAR
  Filled 2019-05-30: qty 2

## 2019-05-30 NOTE — Progress Notes (Signed)
[facility] MD Progress Note  05/30/2019 10:52 AM [name] [name]  MRN:  [id number] Subjective:    Patient remains oppositional and refusing medication and denying illness.  He continues to express multiple delusional beliefs that involve the sheriff/governor/"dead bodies" criminal activity that he has privilege knowledge of so forth so his delusional beliefs remain on shaken and he continues to be noncompliant he did sleep after the injections yesterday.  I did not give him the opportunity to become volatile because when he began to get argumentative with simply end of the interview but he states he will not take medications but understands there is a forced medication consultation and.  He is too dangerous to be released without treatment so were going to use the opportunity to force meds to hopefully contain his explosive anger.  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 Anemia   . Cardiac arrest ([place]) 12/2014  . Depression    Resolved- pt contributes to previous drug use  . Hemorrhoids   . Hepatitis C    S/p completing treatment at [facility]  . IV drug abuse ([place])    Quit. Used in the 54s  . PTSD (post-traumatic stress disorder)    Found deceased brother- resolved per pt  . PULMONARY NODULE 12/21/2009   Stable 83mm per repeat CXR 4/11  . Substance abuse ([place])   . Tobacco abuse    1 ppd x 87 ys. Quit 04/22/12    Past Surgical History:  Procedure Laterality Date  . CARDIAC CATHETERIZATION N/A 01/26/2015   Procedure: Left Heart Cath and Coronary Angiography;   Surgeon: Leonie Man, MD;  Location: [place] [facility];  Service: Cardiovascular;  Laterality: N/A;  . CYST REMOVAL NECK  2011  . EP IMPLANTABLE DEVICE N/A 01/29/2015   Procedure: SubQ ICD Implant;  Surgeon: Deboraha Sprang, MD;  Location: [place] [facility];  Service: Cardiovascular;  Laterality: N/A;  . FRACTURE SURGERY Left    In high school  . MULTIPLE TOOTH EXTRACTIONS  1999  . WISDOM TOOTH EXTRACTION     Family History:  Family History  Problem Relation Age of Onset  . Hyperlipidemia Mother   . Hypertension Mother   . COPD Mother   . Colon polyps Mother 10  . Heart disease Father        Decease from MI at 15yo  . Cancer Maternal Grandfather        Colon cancer, deceased  . Alcohol abuse Maternal Grandfather   . Colon cancer Maternal Grandfather 107  . Esophageal cancer Neg Hx   . Rectal cancer Neg Hx    Family Psychiatric  History: neg Social History:  Social History   Substance and Sexual Activity  Alcohol Use Yes  . Alcohol/week: 6.0 standard drinks  . Types: 6 Cans of beer per week   Comment: occassional     Social History   Substance and Sexual Activity  Drug Use Yes  . Types: "Crack" cocaine, Marijuana, Heroin   Comment: Quit IV drugs in the '80s. Last crack use 2009 or 2010.    Social History  Socioeconomic History  . Marital status: Married    Spouse name: Not on file  . Number of children: Not on file  . Years of education: Not on file  . Highest education level: Not on file  Occupational History  . Not on file  Social Needs  . Financial resource strain: Not on file  . Food insecurity    Worry: Not on file    Inability: Not on file  . Transportation needs    Medical: Not on file    Non-medical: Not on file  Tobacco Use  . Smoking status: Current Every Day Smoker    Packs/day: 0.50    Years: 29.00    Pack years: 14.50    Types: Cigarettes  . Smokeless tobacco: Never Used  . Tobacco comment: 5-10 cigs per day  Substance and Sexual  Activity  . Alcohol use: Yes    Alcohol/week: 6.0 standard drinks    Types: 6 Cans of beer per week    Comment: occassional  . Drug use: Yes    Types: "Crack" cocaine, Marijuana, Heroin    Comment: Quit IV drugs in the '80s. Last crack use 2009 or 2010.  Marland Kitchen Sexual activity: Yes    Partners: Female  Lifestyle  . Physical activity    Days per week: Not on file    Minutes per session: Not on file  . Stress: Not on file  Relationships  . Social Herbalist on phone: Not on file    Gets together: Not on file    Attends religious service: Not on file    Active member of club or organization: Not on file    Attends meetings of clubs or organizations: Not on file    Relationship status: Not on file  Other Topics Concern  . Not on file  Social History Narrative   ** Merged History Encounter **    Pt lives in [place].  He stated that his wife left him, but then stated that wife returned home.  Pt denied outpatient provider.   Additional Social History:                         Sleep: Good  Appetite:  Good  Current Medications: Current Facility-Administered Medications  Medication Dose Route Frequency Provider Last Rate Last Dose  . Dextromethorphan-quiNIDine (NUEDEXTA) 20-10 MG per capsule 1 capsule  1 capsule Oral BID Johnn Hai, MD      . haloperidol lactate (HALDOL) injection 10 mg  10 mg Intramuscular BID PRN Johnn Hai, MD      . lisinopril (ZESTRIL) tablet 10 mg  10 mg Oral Daily Cobos, Myer Peer, MD   10 mg at 05/30/19 0827  . LORazepam (ATIVAN) injection 2 mg  2 mg Intramuscular BID PRN Johnn Hai, MD      . nicotine (NICODERM CQ - dosed in mg/24 hours) patch 21 mg  21 mg Transdermal Daily Sharma Covert, MD      . nitroGLYCERIN (NITROSTAT) SL tablet 0.4 mg  0.4 mg Sublingual Q5 min PRN Rankin, Shuvon B, NP      . OLANZapine zydis (ZYPREXA) disintegrating tablet 5 mg  5 mg Oral Q8H PRN Cobos, Myer Peer, MD       And  . ziprasidone (GEODON)  injection 20 mg  20 mg Intramuscular PRN Cobos, Myer Peer, MD      . perphenazine (TRILAFON) tablet 8 mg  8 mg Oral BID Johnn Hai,  MD        Lab Results: No results found for this or any previous visit (from the past 48 hour(s)).  Blood Alcohol level:  Lab Results  Component Value Date   ETH <10 [id number]    Metabolic Disorder Labs: Lab Results  Component Value Date   HGBA1C 5.5 05/28/2019   MPG 111.15 05/28/2019   MPG 114 01/28/2015   No results found for: PROLACTIN Lab Results  Component Value Date   CHOL 138 05/28/2019   TRIG 48 05/28/2019   HDL 41 05/28/2019   CHOLHDL 3.4 05/28/2019   VLDL 10 05/28/2019   LDLCALC 87 05/28/2019   LDLCALC 82 01/28/2015    Physical Findings: AIMS: Facial and Oral Movements Muscles of Facial Expression: None, normal Lips and Perioral Area: None, normal Jaw: None, normal Tongue: None, normal,Extremity Movements Upper (arms, wrists, hands, fingers): None, normal Lower (legs, knees, ankles, toes): None, normal, Trunk Movements Neck, shoulders, hips: None, normal, Overall Severity Severity of abnormal movements (highest score from questions above): None, normal Incapacitation due to abnormal movements: None, normal Patient's awareness of abnormal movements (rate only patient's report): No Awareness, Dental Status Current problems with teeth and/or dentures?: No Does patient usually wear dentures?: No  CIWA:  CIWA-Ar Total: 1 COWS:  COWS Total Score: 1  Musculoskeletal: Strength & Muscle Tone: within normal limits Gait & Station: normal Patient leans: N/A  Psychiatric Specialty Exam: Physical Exam  ROS  Blood pressure 109/90, pulse (!) 105, temperature (!) 97.5 F (36.4 C), resp. rate 16, height 5\' 10"  (1.778 m), weight 64.9 kg, SpO2 99 %.Body mass index is 20.52 kg/m.  General Appearance: Casual  Eye Contact:  Minimal  Speech:  Normal Rate  Volume:  Increased  Mood:  Angry and Irritable  Affect:  Congruent  Thought  Process:  Irrelevant and Descriptions of Associations: Loose  Orientation:  Full (Time, Place, and Person)  Thought Content:  Illogical, Delusions and Preoccupied with elaborate delusional system and violent threats at intervals  Suicidal Thoughts:  No  Homicidal Thoughts:  No  Memory:  Immediate;   Poor Recent;   Poor Remote;   Fair  Judgement:  Impaired  Insight:  Lacking  Psychomotor Activity:  Normal  Concentration:  Concentration: Fair and Attention Span: Fair  Recall:  [employer] of Knowledge:  Fair  Language:  Fair  Akathisia:  Negative  Handed:  Right  AIMS (if indicated):     Assets:  Leisure Time Physical Health Resilience Social Support  ADL's:  Intact  Cognition:  WNL  Sleep:  Number of Hours: 8.25     Treatment Plan Summary: Daily contact with patient to assess and evaluate symptoms and progress in treatment and Medication management  Again we will have to institute force med orders no change in precautions continue to monitor for safety  Escalate dose of perphenazine to 8 mg twice daily continue current 15-minute checks and continue to attempt reality based therapy despite lack of insight  Fenix Ruppe, MD 05/30/2019, 10:52 AM

## 2019-05-30 NOTE — Progress Notes (Signed)
Recreation Therapy Notes  Date: 10.2.20 Time: 1000 Location: [address] Dayroom   Group Topic: Communication, Team Building, Problem Solving  Goal Area(s) Addresses:  Patient will effectively work with peer towards shared goal.  Patient will identify skill used to make activity successful.  Patient will identify how skills used during activity can be used to reach post d/c goals.   Intervention: STEM Activity   Activity: [employer]. Patients were provided the following materials: 5 drinking straws, 5 rubber bands, 5 paper clips, 2 index cards, and 2 drinking cups.  Using the provided materials patients were asked to build a launching mechanisms to launch a ping pong ball approximately 12 feet. Patients were divided into teams of 3-5.   Education: Education officer, community, Dentist.   Education Outcome: Acknowledges education/In group clarification offered/Needs additional education.   Clinical Observations/Feedback: Pt did not attend group.    Victorino Sparrow, LRT/CTRS         Victorino Sparrow A 05/30/2019 11:29 AM

## 2019-05-30 NOTE — Progress Notes (Signed)
D: Pt denies SI/HI/AVH. Pt is pleasant and cooperative this evening. Pt understands the process of forced meds and stated he was going to comply with oral medications, but pt has been seen to be very labile.  A: Pt was offered support and encouragement. Pt was given scheduled medications. Pt was encourage to attend groups. Q 15 minute checks were done for safety.  R:Pt attends groups and interacts well with peers and staff. Pt is taking medication. Pt has no complaints.Pt receptive to treatment and safety maintained on unit.

## 2019-05-31 MED ORDER — TRAZODONE HCL 100 MG PO TABS
100.0000 mg | ORAL_TABLET | Freq: Once | ORAL | Status: AC
  Administered 2019-05-31: 21:00:00 100 mg via ORAL
  Filled 2019-05-31 (×2): qty 1

## 2019-05-31 NOTE — Progress Notes (Signed)
Pt up on the unit yelling screaming and cursing at staff.

## 2019-05-31 NOTE — Progress Notes (Addendum)
[name] Progress Note  05/31/2019 1:31 PM [name] [name]  MRN:  [id number] Subjective:  "I don't need these fucking meds."  [name] [name] is a 58 year old male with history of hypoxic brain injury and paranoia who presented for treatment of depression with agitation and delusions. He has been agitated and threatening toward staff since admission. Today he is found lying in bed. He remains labile with several episodes of agitation, yelling and cursing at staff over the last day. He took increased dose of Trilafon last night but required IM Haldol this morning per forced medication order. He has been sleeping in bed this morning. He reports good sleep overnight. 6.5 hours of sleep recorded. He remains irritable on assessment today and is demanding to leave the hospital- "I don't need this medication. You're making me worse." He remains delusional and continues to state he is involved in a legal case with "all these dead bodies" and "the sheriffs." He denies SI/HI/AVH but has been threatening toward staff. He is demanding lunch on assessment, but nursing staff report he already ate lunch.   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 Anemia   . Cardiac arrest ([name] [name]) 12/2014  . Depression    Resolved- pt contributes to previous drug use  . Hemorrhoids   . Hepatitis C    S/p completing treatment at [name]  . IV drug abuse ([name])    Quit. Used in the 30s  . PTSD (post-traumatic stress disorder)    Found deceased brother- resolved per pt  . PULMONARY NODULE 12/21/2009   Stable 53mm per repeat CXR 4/11  . Substance abuse ([place])   . Tobacco abuse    1 ppd x 65 ys. Quit 04/22/12    Past Surgical History:  Procedure Laterality Date  .  CARDIAC CATHETERIZATION N/A 01/26/2015   Procedure: Left Heart Cath and Coronary Angiography;  Surgeon: [name], [name] [name];  Location: [name];  Service: [name];  Laterality: N/A;  . CYST REMOVAL NECK  2011  . EP IMPLANTABLE DEVICE N/A 01/29/2015   Procedure: SubQ ICD Implant;  Surgeon: [name], [name] [name];  Location: [name];  Service: [name];  Laterality: N/A;  . FRACTURE [name] Left    In high school  . MULTIPLE TOOTH EXTRACTIONS  1999  . WISDOM TOOTH EXTRACTION     Family History:  Family History  Problem Relation Age of Onset  . Hyperlipidemia Mother   . Hypertension Mother   . COPD Mother   . Colon polyps Mother 80  . Heart disease Father        Decease from MI at 63yo  . Cancer Maternal Grandfather        Colon cancer, deceased  . Alcohol abuse Maternal Grandfather   . Colon cancer Maternal Grandfather 56  . Esophageal cancer Neg Hx   . Rectal cancer Neg Hx    Family Psychiatric  History: See admission H&P Social History:  Social History   Substance and Sexual Activity  Alcohol Use Yes  . Alcohol/week: 6.0 standard drinks  . Types: 6 Cans of beer per week   Comment: occassional     Social History   Substance and Sexual Activity  Drug Use Yes  . Types: "Crack"  cocaine, Marijuana, Heroin   Comment: Quit IV drugs in the '80s. Last crack use 2009 or 2010.    Social History   Socioeconomic History  . Marital status: Married    Spouse name: Not on file  . Number of children: Not on file  . Years of education: Not on file  . Highest education level: Not on file  Occupational History  . Not on file  Social Needs  . Financial resource strain: Not on file  . Food insecurity    Worry: Not on file    Inability: Not on file  . Transportation needs    Medical: Not on file    Non-medical: Not on file  Tobacco Use  . Smoking status: Current Every Day Smoker    Packs/day: 0.50    Years: 29.00    Pack years: 14.50    Types:  Cigarettes  . Smokeless tobacco: Never Used  . Tobacco comment: 5-10 cigs per day  Substance and Sexual Activity  . Alcohol use: Yes    Alcohol/week: 6.0 standard drinks    Types: 6 Cans of beer per week    Comment: occassional  . Drug use: Yes    Types: "Crack" cocaine, Marijuana, Heroin    Comment: Quit IV drugs in the '80s. Last crack use 2009 or 2010.  Marland Kitchen Sexual activity: Yes    Partners: Female  Lifestyle  . Physical activity    Days per week: Not on file    Minutes per session: Not on file  . Stress: Not on file  Relationships  . Social Herbalist on phone: Not on file    Gets together: Not on file    Attends religious service: Not on file    Active member of club or organization: Not on file    Attends meetings of clubs or organizations: Not on file    Relationship status: Not on file  Other Topics Concern  . Not on file  Social History Narrative   ** Merged History Encounter **    Pt lives in [place].  He stated that his wife left him, but then stated that wife returned home.  Pt denied outpatient provider.   Additional Social History:                         Sleep: Good  Appetite:  Good  Current Medications: Current Facility-Administered Medications  Medication Dose Route Frequency Provider Last Rate Last Dose  . Dextromethorphan-quiNIDine (NUEDEXTA) 20-10 MG per capsule 1 capsule  1 capsule Oral BID [name] Hai, [name] [name]      . haloperidol lactate (HALDOL) injection 10 mg  10 mg Intramuscular BID PRN [name] Hai, [name] [name]   10 mg at 05/31/19 0819  . lisinopril (ZESTRIL) tablet 10 mg  10 mg Oral Daily Alita Waldren, Myer Peer, [name] [name]   10 mg at 05/31/19 0750  . LORazepam (ATIVAN) injection 2 mg  2 mg Intramuscular BID PRN [name] Hai, [name] [name]   2 mg at 05/31/19 [id number]  . nicotine (NICODERM CQ - dosed in mg/24 hours) patch 21 mg  21 mg Transdermal Daily Sharma Covert, [name]      . nitroGLYCERIN (NITROSTAT) SL tablet 0.4 mg  0.4 mg Sublingual Q5 min PRN Rankin,  Shuvon B, NP      . OLANZapine zydis (ZYPREXA) disintegrating tablet 5 mg  5 mg Oral Q8H PRN Orhan Mayorga, Myer Peer, [name] [name]   5 mg at 05/30/19 2111   And  .  ziprasidone (GEODON) injection 20 mg  20 mg Intramuscular PRN Lilyonna Steidle, Myer Peer, [name] [name]      . perphenazine (TRILAFON) tablet 8 mg  8 mg Oral BID [name] Hai, [name] [name]   8 mg at 05/30/19 1706    [name] Results: No results found for this or any previous visit (from the past 72 hour(s)).  Blood Alcohol level:  [name] Results  Component Value Date   ETH <10 [id number]    Metabolic Disorder Labs: [name] Results  Component Value Date   HGBA1C 5.5 05/28/2019   MPG 111.15 05/28/2019   MPG 114 01/28/2015   No results found for: PROLACTIN [name] Results  Component Value Date   CHOL 138 05/28/2019   TRIG 48 05/28/2019   HDL 41 05/28/2019   CHOLHDL 3.4 05/28/2019   VLDL 10 05/28/2019   LDLCALC 87 05/28/2019   LDLCALC 82 01/28/2015    Physical Findings: AIMS: Facial and Oral Movements Muscles of Facial Expression: None, normal Lips and Perioral Area: None, normal Jaw: None, normal Tongue: None, normal,Extremity Movements Upper (arms, wrists, hands, fingers): None, normal Lower (legs, knees, ankles, toes): None, normal, Trunk Movements Neck, shoulders, hips: None, normal, Overall Severity Severity of abnormal movements (highest score from questions above): None, normal Incapacitation due to abnormal movements: None, normal Patient's awareness of abnormal movements (rate only patient's report): No Awareness, Dental Status Current problems with teeth and/or dentures?: No Does patient usually wear dentures?: No  CIWA:  CIWA-Ar Total: 1 COWS:  COWS Total Score: 1  Musculoskeletal: Strength & Muscle Tone: within normal limits Gait & Station: normal Patient leans: N/A  Psychiatric Specialty Exam: Physical Exam  Nursing note and vitals reviewed. Constitutional: He is oriented to person, place, and time. He appears well-developed and well-nourished.   [name]: Normal rate.  Respiratory: Effort normal.  Neurological: He is alert and oriented to person, place, and time.    Review of Systems  Constitutional: Negative.   Respiratory: Negative for cough and shortness of breath.   [name]: Negative for chest pain.  Psychiatric/Behavioral: Positive for depression and substance abuse (BZDs, THC). Negative for hallucinations and suicidal ideas. The patient is not nervous/anxious and does not have insomnia.     Blood pressure (!) 134/109, pulse 91, temperature (!) 97.4 F (36.3 C), resp. rate 16, height 5\' 10"  (1.778 m), weight 64.9 kg, SpO2 99 %.Body mass index is 20.52 kg/m.  General Appearance: Disheveled  Eye Contact:  Minimal  Speech:  Slow  Volume:  Increased  Mood:  Irritable  Affect:  Congruent  Thought Process:  Disorganized  Orientation:  Full (Time, Place, and Person)  Thought Content:  Illogical and Delusions  Suicidal Thoughts:  No  Homicidal Thoughts:  No  Memory:  Immediate;   Poor Recent;   Poor  Judgement:  Impaired  Insight:  Lacking  Psychomotor Activity:  Decreased  Concentration:  Concentration: Poor and Attention Span: Poor  Recall:  Poor  Fund of Knowledge:  Fair  Language:  Fair  Akathisia:  No  Handed:  Right  AIMS (if indicated):     Assets:  Housing Leisure Time Resilience Social Support  ADL's:  Intact  Cognition:  WNL  Sleep:  Number of Hours: 6.5     Treatment Plan Summary: Daily contact with patient to assess and evaluate symptoms and progress in treatment and Medication management   Continue inpatient hospitalization.  Continue Trilafon 8 mg PO BID for psychosis, with forced Haldol 10 mg PO BID PRN refusal of Trilafon Continue lisinopril 10  gm PO daily for HTN Continue agitation protocol PRN agitation  Patient will participate in the therapeutic group milieu.  Discharge disposition in progress.   [name] Burkitt, NP 05/31/2019, 1:31 PM   Attest to NP Progress Note

## 2019-05-31 NOTE — Progress Notes (Signed)
Patient ID: KEVORK BOWNDS, male   DOB: [birth year], 58 y.o.   MRN: [id number]  [place] NOVEL CORONAVIRUS (COVID-19) DAILY CHECK-OFF SYMPTOMS - answer yes or no to each - every day NO YES  Have you had a fever in the past 24 hours?  . Fever (Temp > 37.80C / 100F) X   Have you had any of these symptoms in the past 24 hours? . New Cough .  Sore Throat  .  Shortness of Breath .  Difficulty Breathing .  Unexplained Body Aches   X   Have you had any one of these symptoms in the past 24 hours not related to allergies?   . Runny Nose .  Nasal Congestion .  Sneezing   X   If you have had runny nose, nasal congestion, sneezing in the past 24 hours, has it worsened?  X   EXPOSURES - check yes or no X   Have you traveled outside the state in the past 14 days?  X   Have you been in contact with someone with a confirmed diagnosis of COVID-19 or PUI in the past 14 days without wearing appropriate PPE?  X   Have you been living in the same home as a person with confirmed diagnosis of COVID-19 or a PUI (household contact)?    X   Have you been diagnosed with COVID-19?    X              What to do next: Answered NO to all: Answered YES to anything:   Proceed with unit schedule Follow the [facility] Inpatient Flowsheet.

## 2019-05-31 NOTE — Progress Notes (Signed)
[facility] Group Notes:  (Nursing/MHT/Case Management/Adjunct)  Date:  05/31/2019  Time: 0900  Type of Therapy:  Nurse Education  Participation Level:  Did Not Attend  Tyler Cubit L  

## 2019-05-31 NOTE — Progress Notes (Signed)
"   YOU NEED TO SHUT THE [place] UP AND GET OUT OF MY [place]."

## 2019-05-31 NOTE — Plan of Care (Signed)
  Problem: Self-Concept: Goal: Level of anxiety will decrease Outcome: Progressing   Problem: Safety: Goal: Periods of time without injury will increase Outcome: Progressing

## 2019-05-31 NOTE — [facility] Group Notes (Signed)
Group Therapy was deferred for outside time.  Selmer Dominion, LCSW 05/31/2019, 4:39 PM

## 2019-05-31 NOTE — Progress Notes (Signed)
Adult Psychoeducational Group Note  Date:  05/31/2019 Time:  1:36 AM  Group Topic/Focus:  Wrap-Up Group:   The focus of this group is to help patients review their daily goal of treatment and discuss progress on daily workbooks.  Participation Level:  Active  Participation Quality:  Appropriate  Affect:  Appropriate  Cognitive:  Appropriate  Insight: Appropriate  Engagement in Group:  Developing/Improving  Modes of Intervention:  Discussion  Additional Comments: Pt stated his goal for today was to talk with his doctor about his discharge plan. Pt stated he did not accomplished his goal today. Pt stated he is hoping to be discharge tomorrow.  Pt stated his relationship with his family has improved since he was admitted here. Pt stated he felt better about himself today. Pt rated his overall day an 9 out of 10. Pt stated his appetite was pretty good today. Pt stated his goal for tonight was to get some rest. Pt did complain of pain in his back. Pt nurse was made aware of the situation. Pt stated he was not hearing or seeing anything that was not there. Pt stated he had no thoughts of harming himself or others. Pt stated he would alert staff if anything changes.   Candy Sledge 05/31/2019, 1:36 AM

## 2019-06-01 MED ORDER — TRAZODONE HCL 100 MG PO TABS
ORAL_TABLET | ORAL | Status: AC
  Administered 2019-06-01: 01:00:00 100 mg
  Filled 2019-06-01: qty 1

## 2019-06-01 MED ORDER — TRAZODONE HCL 100 MG PO TABS
100.0000 mg | ORAL_TABLET | Freq: Once | ORAL | Status: AC
  Filled 2019-06-01: qty 1

## 2019-06-01 MED ORDER — TRAZODONE HCL 100 MG PO TABS
100.0000 mg | ORAL_TABLET | Freq: Every evening | ORAL | Status: DC | PRN
  Administered 2019-06-01: 22:00:00 100 mg via ORAL
  Filled 2019-06-01 (×2): qty 1

## 2019-06-01 NOTE — Progress Notes (Addendum)
[facility] MD Progress Note  06/01/2019 1:37 PM [name] [name]  MRN:  [id number] Subjective:  "I'm fine."  [name] [name] is a 58 year old male with history of hypoxic brain injury and paranoia who presented for treatment of depression with agitation, aggression and delusions. On assessment today he is found sitting in the dayroom. He is significantly improved from yesterday. He presents with euthymic affect and interacts pleasantly. He reports good mood. When asked about reason for improvement, he states "I realized you can't hold me here forever" and states he had been angry because he wanted to discharge. He has been med compliant with perphenazine since last night. Denies medication side effects. No agitated or disruptive behaviors on the unit today. He has been participating in groups. He denies SI/HI/AVH. No delusional thought content expressed. No signs of responding to internal stimuli.  Principal Problem: <principal problem not specified> Diagnosis: Active Problems:   Brief psychotic disorder ([place])   Acute posttraumatic organic psychosis ([place])  Total Time spent with patient: 15 minutes  Past Psychiatric History: See admission H&P  Past Medical History:  Past Medical History:  Diagnosis Date  . Anemia   . Cardiac arrest ([place]) 12/2014  . Depression    Resolved- pt contributes to previous drug use  . Hemorrhoids   . Hepatitis C    S/p completing treatment at [facility]  . IV drug abuse ([place])    Quit. Used in the 2s  . PTSD (post-traumatic stress disorder)    Found deceased brother- resolved per pt  . PULMONARY NODULE 12/21/2009   Stable 26mm per repeat CXR 4/11  . Substance abuse ([place])   . Tobacco abuse    1 ppd x 50 ys. Quit 04/22/12    Past Surgical History:  Procedure Laterality Date  . CARDIAC CATHETERIZATION N/A 01/26/2015   Procedure: Left Heart Cath and Coronary Angiography;  Surgeon: Leonie Man, MD;  Location: [facility];  Service: Cardiovascular;  Laterality: N/A;  .  CYST REMOVAL NECK  2011  . EP IMPLANTABLE DEVICE N/A 01/29/2015   Procedure: SubQ ICD Implant;  Surgeon: Deboraha Sprang, MD;  Location: [facility];  Service: Cardiovascular;  Laterality: N/A;  . FRACTURE SURGERY Left    In high school  . MULTIPLE TOOTH EXTRACTIONS  1999  . WISDOM TOOTH EXTRACTION     Family History:  Family History  Problem Relation Age of Onset  . Hyperlipidemia Mother   . Hypertension Mother   . COPD Mother   . Colon polyps Mother 15  . Heart disease Father        Decease from MI at 46yo  . Cancer Maternal Grandfather        Colon cancer, deceased  . Alcohol abuse Maternal Grandfather   . Colon cancer Maternal Grandfather 48  . Esophageal cancer Neg Hx   . Rectal cancer Neg Hx    Family Psychiatric  History: See admission H&P Social History:  Social History   Substance and Sexual Activity  Alcohol Use Yes  . Alcohol/week: 6.0 standard drinks  . Types: 6 Cans of beer per week   Comment: occassional     Social History   Substance and Sexual Activity  Drug Use Yes  . Types: "Crack" cocaine, Marijuana, Heroin   Comment: Quit IV drugs in the '80s. Last crack use 2009 or 2010.    Social History   Socioeconomic History  . Marital status: Married    Spouse name: Not on file  .  Number of children: Not on file  . Years of education: Not on file  . Highest education level: Not on file  Occupational History  . Not on file  Social Needs  . Financial resource strain: Not on file  . Food insecurity    Worry: Not on file    Inability: Not on file  . Transportation needs    Medical: Not on file    Non-medical: Not on file  Tobacco Use  . Smoking status: Current Every Day Smoker    Packs/day: 0.50    Years: 29.00    Pack years: 14.50    Types: Cigarettes  . Smokeless tobacco: Never Used  . Tobacco comment: 5-10 cigs per day  Substance and Sexual Activity  . Alcohol use: Yes    Alcohol/week: 6.0 standard drinks    Types: 6 Cans of beer per  week    Comment: occassional  . Drug use: Yes    Types: "Crack" cocaine, Marijuana, Heroin    Comment: Quit IV drugs in the '80s. Last crack use 2009 or 2010.  Marland Kitchen Sexual activity: Yes    Partners: Female  Lifestyle  . Physical activity    Days per week: Not on file    Minutes per session: Not on file  . Stress: Not on file  Relationships  . Social Herbalist on phone: Not on file    Gets together: Not on file    Attends religious service: Not on file    Active member of club or organization: Not on file    Attends meetings of clubs or organizations: Not on file    Relationship status: Not on file  Other Topics Concern  . Not on file  Social History Narrative   ** Merged History Encounter **    Pt lives in [place].  He stated that his wife left him, but then stated that wife returned home.  Pt denied outpatient provider.   Additional Social History:                         Sleep: Good  Appetite:  Good  Current Medications: Current Facility-Administered Medications  Medication Dose Route Frequency Provider Last Rate Last Dose  . Dextromethorphan-quiNIDine (NUEDEXTA) 20-10 MG per capsule 1 capsule  1 capsule Oral BID Johnn Hai, MD      . haloperidol lactate (HALDOL) injection 10 mg  10 mg Intramuscular BID PRN Johnn Hai, MD   10 mg at 05/31/19 0819  . lisinopril (ZESTRIL) tablet 10 mg  10 mg Oral Daily Karmin Kasprzak, Myer Peer, MD   10 mg at 06/01/19 0805  . LORazepam (ATIVAN) injection 2 mg  2 mg Intramuscular BID PRN Johnn Hai, MD   2 mg at 05/31/19 [id number]  . nicotine (NICODERM CQ - dosed in mg/24 hours) patch 21 mg  21 mg Transdermal Daily Sharma Covert, MD      . nitroGLYCERIN (NITROSTAT) SL tablet 0.4 mg  0.4 mg Sublingual Q5 min PRN Rankin, Shuvon B, NP      . OLANZapine zydis (ZYPREXA) disintegrating tablet 5 mg  5 mg Oral Q8H PRN Zurie Platas, Myer Peer, MD   5 mg at 05/30/19 2111  . perphenazine (TRILAFON) tablet 8 mg  8 mg Oral BID Johnn Hai,  MD   8 mg at 06/01/19 0805    Lab Results: No results found for this or any previous visit (from the past 59 hour(s)).  Blood Alcohol level:  Lab Results  Component Value Date   ETH <10 [id number]    Metabolic Disorder Labs: Lab Results  Component Value Date   HGBA1C 5.5 05/28/2019   MPG 111.15 05/28/2019   MPG 114 01/28/2015   No results found for: PROLACTIN Lab Results  Component Value Date   CHOL 138 05/28/2019   TRIG 48 05/28/2019   HDL 41 05/28/2019   CHOLHDL 3.4 05/28/2019   VLDL 10 05/28/2019   LDLCALC 87 05/28/2019   LDLCALC 82 01/28/2015    Physical Findings: AIMS: Facial and Oral Movements Muscles of Facial Expression: None, normal Lips and Perioral Area: None, normal Jaw: None, normal Tongue: None, normal,Extremity Movements Upper (arms, wrists, hands, fingers): None, normal Lower (legs, knees, ankles, toes): None, normal, Trunk Movements Neck, shoulders, hips: None, normal, Overall Severity Severity of abnormal movements (highest score from questions above): None, normal Incapacitation due to abnormal movements: None, normal Patient's awareness of abnormal movements (rate only patient's report): No Awareness, Dental Status Current problems with teeth and/or dentures?: No Does patient usually wear dentures?: No  CIWA:  CIWA-Ar Total: 1 COWS:  COWS Total Score: 1  Musculoskeletal: Strength & Muscle Tone: within normal limits Gait & Station: normal Patient leans: N/A  Psychiatric Specialty Exam: Physical Exam  Nursing note and vitals reviewed. Constitutional: He is oriented to person, place, and time. He appears well-developed and well-nourished.  Cardiovascular: Normal rate.  Respiratory: Effort normal.  Neurological: He is alert and oriented to person, place, and time.    Review of Systems  Constitutional: Negative.   Respiratory: Negative for cough and shortness of breath.   Cardiovascular: Negative for chest pain.  Psychiatric/Behavioral:  Positive for depression and substance abuse (BZDs, THC). Negative for hallucinations and suicidal ideas. The patient is not nervous/anxious and does not have insomnia.     Blood pressure (!) 130/96, pulse 93, temperature 98.2 F (36.8 C), temperature source Oral, resp. rate 18, height 5\' 10"  (1.778 m), weight 64.9 kg, SpO2 99 %.Body mass index is 20.52 kg/m.  General Appearance: Casual  Eye Contact:  Good  Speech:  Normal Rate  Volume:  Normal  Mood:  Euthymic  Affect:  Appropriate and Congruent  Thought Process:  Coherent  Orientation:  Full (Time, Place, and Person)  Thought Content:  Logical  Suicidal Thoughts:  No  Homicidal Thoughts:  No  Memory:  Immediate;   Fair Recent;   Fair  Judgement:  Fair  Insight:  Lacking  Psychomotor Activity:  Normal  Concentration:  Concentration: Good and Attention Span: Good  Recall:  Good  Fund of Knowledge:  Fair  Language:  Good  Akathisia:  No  Handed:  Right  AIMS (if indicated):     Assets:  Communication Skills Desire for Improvement Housing Resilience Social Support  ADL's:  Intact  Cognition:  WNL  Sleep:  Number of Hours: 5     Treatment Plan Summary: Daily contact with patient to assess and evaluate symptoms and progress in treatment and Medication management   Continue inpatient hospitalization.  Continue Trilafon 8 mg PO BID for psychosis, with forced Haldol 10 mg IM BID PRN refusal of Trilafon Continue lisinopril 10 gm PO daily for HTN Continue agitation protocol PRN agitation  Patient will participate in the therapeutic group milieu.  Discharge disposition in progress.   Connye Burkitt, NP 06/01/2019, 1:37 PM  Attest to NP Progress Note

## 2019-06-01 NOTE — [facility] Group Notes (Signed)
[place] LCSW Group Therapy Note  Date/Time:  06/01/2019  9:30am-10:30am  Type of Therapy and Topic:  Group Therapy:  Music and Mood  Participation Level:  Minimal   Description of Group: In this process group, members listened to a variety of genres of music and identified that different types of music evoke different responses.  Patients were encouraged to identify music that was soothing for them and music that was energizing for them.  Patients discussed how this knowledge can help with wellness and recovery in various ways including managing depression and anxiety as well as encouraging healthy sleep habits.    Therapeutic Goals: 1. Patients will explore the impact of different varieties of music on mood 2. Patients will verbalize the thoughts they have when listening to different types of music 3. Patients will identify music that is soothing to them as well as music that is energizing to them 4. Patients will discuss how to use this knowledge to assist in maintaining wellness and recovery 5. Patients will explore the use of music as a coping skill  Summary of Patient Progress:  At the beginning of group, patient expressed that he felt tired.  He was attentive for the first 20 minutes of group, then started going in and out of the room a lot.  When he did come into the room he was intrusive and talked with other people.  At the end of group, patient was once again not present, was heard on the phone in the hall yelling at someone.    Therapeutic Modalities: Solution Focused Brief Therapy Activity   Selmer Dominion, LCSW

## 2019-06-01 NOTE — Progress Notes (Signed)
Adult Psychoeducational Group Note  Date:  06/01/2019 Time:  1:45 AM  Group Topic/Focus:  Wrap-Up Group:   The focus of this group is to help patients review their daily goal of treatment and discuss progress on daily workbooks.  Participation Level:  Did Not Attend  Participation Quality:  Did Not Attend  Affect:  Did Not Attend  Cognitive:  Did Not Attend  Insight: None  Engagement in Group:  Did Not Attend  Modes of Intervention:  Did Not Attend  Additional Comments:  Pt did not attend even wrap up group tonight.  Candy Sledge 06/01/2019, 1:45 AM

## 2019-06-01 NOTE — Progress Notes (Signed)
Patient ID: [name] [name], male   DOB: 1960/12/27, 58 y.o.   MRN: [id number]   [place] NOVEL CORONAVIRUS (COVID-19) DAILY CHECK-OFF SYMPTOMS - answer yes or no to each - every day NO YES  Have you had a fever in the past 24 hours?  . Fever (Temp > 37.80C / 100F) X   Have you had any of these symptoms in the past 24 hours? . New Cough .  Sore Throat  .  Shortness of Breath .  Difficulty Breathing .  Unexplained Body Aches   X   Have you had any one of these symptoms in the past 24 hours not related to allergies?   . Runny Nose .  Nasal Congestion .  Sneezing   X   If you have had runny nose, nasal congestion, sneezing in the past 24 hours, has it worsened?  X   EXPOSURES - check yes or no X   Have you traveled outside the state in the past 14 days?  X   Have you been in contact with someone with a confirmed diagnosis of COVID-19 or PUI in the past 14 days without wearing appropriate PPE?  X   Have you been living in the same home as a person with confirmed diagnosis of COVID-19 or a PUI (household contact)?    X   Have you been diagnosed with COVID-19?    X              What to do next: Answered NO to all: Answered YES to anything:   Proceed with unit schedule Follow the [facility] Inpatient Flowsheet.

## 2019-06-01 NOTE — [facility] Counselor (Signed)
Clinical Social Work Note  Patient is overheard on the phone with his mother, calling her "Mama" and giving her instructions repeatedly about taking care of some kittens.  He becomes upset and curses at her, tells her "When I get out of this place, I'm gonna come take care of ya'll."  He then tells her that he hopes she dies and it is painful, "and I'm gonna laugh."  He becomes very quiet and whispers into the phone "I'll take care of ya'll, I promise."  He then gets loud again and yells, "You bitch, I hope it's tonight."  Selmer Dominion, LCSW 06/01/2019, 3:53 PM

## 2019-06-01 NOTE — Progress Notes (Signed)
Patient has been in his room resting until after visitation ended, He reports that he slept most of the day because he got shots. He did request some medication for sleep which he received. He has been pleasant tonight.  Safety maintained with 15 min checks

## 2019-06-01 NOTE — Progress Notes (Signed)
Writer has observed patient up in the dayroom watching the football game. He reports having had a good day. Writer praised him for coming off unit restrictions and cutting back on his profanity. He laughed and gave himself a hand clap. He is much more pleasant tonight. He requested medication to help him rest. Safety maintained on unit with 15 min checks.

## 2019-06-01 NOTE — Plan of Care (Signed)
  Problem: Self-Concept: Goal: Ability to identify factors that promote anxiety will improve Outcome: Progressing   Problem: Education: Goal: Verbalization of understanding the information provided will improve Outcome: Progressing   Problem: Activity: Goal: Interest or engagement in activities will improve Outcome: Progressing

## 2019-06-01 NOTE — Progress Notes (Signed)
[facility] Group Notes:  (Nursing/MHT/Case Management/Adjunct)  Date:  06/01/2019  Time:  0900  Type of Therapy:  Nurse Education  Participation Level:  Active  Participation Quality:  Appropriate  Affect:  Appropriate  Cognitive:  Appropriate  Insight:  Appropriate  Engagement in Group:  Engaged  Modes of Intervention:  Discussion and Education  Summary of Progress/Problems:  [name] [name] L

## 2019-06-02 MED ORDER — ARIPIPRAZOLE ER 400 MG IM SRER
400.0000 mg | INTRAMUSCULAR | Status: DC
  Administered 2019-06-03: 400 mg via INTRAMUSCULAR
  Filled 2019-06-02: qty 2

## 2019-06-02 MED ORDER — ARIPIPRAZOLE 2 MG PO TABS
2.0000 mg | ORAL_TABLET | Freq: Once | ORAL | Status: AC
  Administered 2019-06-02: 17:00:00 2 mg via ORAL
  Filled 2019-06-02 (×2): qty 1

## 2019-06-02 NOTE — Progress Notes (Signed)
Recreation Therapy Notes  Date: 10.5.20 Time: 1000 Location: 400 Hall Dayroom  Group Topic: Wellness  Goal Area(s) Addresses:  Patient will define components of whole wellness. Patient will verbalize benefit of whole wellness.  Intervention: Exercise, Music  Activity:  Exercises.  LRT introduced activity to patients.  LRT encouraged patients to do as much as possible without straining themselves, take breaks if needed and drink water when needed.  Education: Wellness, Dentist.   Education Outcome: Acknowledges education/In group clarification offered/Needs additional education.   Clinical Observations/Feedback:  Pt did not attend group session.    Victorino Sparrow, LRT/CTRS    Ria Comment, Tandy Grawe A 06/02/2019 11:07 AM

## 2019-06-02 NOTE — Progress Notes (Signed)
Adult Psychoeducational Group Note  Date:  06/02/2019 Time:  12:03 AM  Group Topic/Focus:  Wrap-Up Group:   The focus of this group is to help patients review their daily goal of treatment and discuss progress on daily workbooks.  Participation Level:  Active  Participation Quality:  Appropriate  Affect:  Appropriate and Excited  Cognitive:  Appropriate  Insight: Appropriate  Engagement in Group:  Developing/Improving  Modes of Intervention:  Discussion  Additional Comments:  Pt stated his goal for today was to focus on his treatment plan and to get off unit restriction. Pt stated he accomplished both of his goals today. Pt stated his relationship with his family has improved since he was admitted. Pt stated he felt better about himself today. Pt rated his overall day a 10. Pt stated his appetite was improving today. Pt stated his goal for the night was to get some rest. Pt did not complain of any pain tonight. Pt stated he was not hearing or seeing anything that was not there. Pt stated he had no thoughts of harming himself or others. Pt stated if anything change he would alert staff.  Candy Sledge 06/02/2019, 12:03 AM

## 2019-06-02 NOTE — [facility] Group Notes (Signed)
[facility] LCSW Group Therapy Note  Date/Time: 06/02/2019 @ 1:30pm  Type of Therapy and Topic:  Group Therapy:  Overcoming Obstacles  Participation Level:  [facility] PARTICIPATION LEVEL: Active  Description of Group:    In this group patients will be encouraged to explore what they see as obstacles to their own wellness and recovery. They will be guided to discuss their thoughts, feelings, and behaviors related to these obstacles. The group will process together ways to cope with barriers, with attention given to specific choices patients can make. Each patient will be challenged to identify changes they are motivated to make in order to overcome their obstacles. This group will be process-oriented, with patients participating in exploration of their own experiences as well as giving and receiving support and challenge from other group members.  Therapeutic Goals: 1. Patient will identify personal and current obstacles as they relate to admission. 2. Patient will identify barriers that currently interfere with their wellness or overcoming obstacles.  3. Patient will identify feelings, thought process and behaviors related to these barriers. 4. Patient will identify two changes they are willing to make to overcome these obstacles:    Summary of Patient Progress  Patient was active and engaged throughout group therapy today. Patient was able to identify a current obstacle that he is trying to overcome which is the logistics of his diagnosis. Patient was able to identify barriers that currently interfere with overcoming his obstacles which is his wife not answering the phone. Patient was able to identify changes that he is willing to make to overcome his obstacles which is coming with with two plans (a plan A and a plan B).     Therapeutic Modalities:   Cognitive Behavioral Therapy Solution Focused Therapy Motivational Interviewing Relapse Prevention Therapy   Ardelle Anton, LCSW

## 2019-06-02 NOTE — Progress Notes (Signed)
[facility] MD Progress Note  06/02/2019 1:25 PM [name] [name]  MRN:  [id number] Subjective:   Patient finally has a non-volatile conversation with me today, he states that the issue "with the sheriff" is "down the road" and he will not elaborate further but is told not to focus on this previously expressed delusional material  Spoke with his wife regarding her safety she states she is working on a restraining order and she has moved out she cannot take the verbal abuse and volatility anymore  Patient believes he will be staying with his mother upon discharge.  He does after much discussion agree to long-acting injectable because the treatment team is fairly certain that he will discontinue his oral medications upon discharge and will still have his organic affective disorder  Currently alert oriented to person place situation and denies thoughts of harming self or others understands risk benefits side effects of meds.  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 Anemia   . Cardiac arrest ([place]) 12/2014  . Depression    Resolved- pt contributes to previous drug use  . Hemorrhoids   . Hepatitis C    S/p completing treatment at [facility]  . IV drug abuse ([place])    Quit. Used in the 5s  . PTSD (post-traumatic stress disorder)    Found deceased brother- resolved per pt  . 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 Substance abuse ([place])   . Tobacco abuse    1 ppd x 75 ys. Quit 04/22/12    Past Surgical History:  Procedure Laterality Date  . CARDIAC CATHETERIZATION N/A 01/26/2015   Procedure: Left Heart Cath and Coronary Angiography;  Surgeon:  Leonie Man, MD;  Location: [place] [facility];  Service: Cardiovascular;  Laterality: N/A;  . CYST REMOVAL NECK  2011  . EP IMPLANTABLE DEVICE N/A 01/29/2015   Procedure: SubQ ICD Implant;  Surgeon: Deboraha Sprang, MD;  Location: [place] CV LAB;  Service: Cardiovascular;  Laterality: N/A;  . FRACTURE SURGERY Left    In high school  . MULTIPLE TOOTH EXTRACTIONS  1999  . WISDOM TOOTH EXTRACTION     Family History:  Family History  Problem Relation Age of Onset  . Hyperlipidemia Mother   . Hypertension Mother   . COPD Mother   . Colon polyps Mother 86  . Heart disease Father        Decease from MI at 30yo  . Cancer Maternal Grandfather        Colon cancer, deceased  . Alcohol abuse Maternal Grandfather   . Colon cancer Maternal Grandfather 30  . Esophageal cancer Neg Hx   . Rectal cancer Neg Hx    Family Psychiatric  History: see eval Social History:  Social History   Substance and Sexual Activity  Alcohol Use Yes  . Alcohol/week: 6.0 standard drinks  . Types: 6 Cans of beer per week   Comment: occassional     Social History   Substance and Sexual Activity  Drug Use Yes  . Types: "Crack" cocaine, Marijuana, Heroin   Comment: Quit IV drugs in the '80s. Last crack use 2009 or  2010.    Social History   Socioeconomic History  . Marital status: Married    Spouse name: Not on file  . Number of children: Not on file  . Years of education: Not on file  . Highest education level: Not on file  Occupational History  . Not on file  Social Needs  . Financial resource strain: Not on file  . Food insecurity    Worry: Not on file    Inability: Not on file  . Transportation needs    Medical: Not on file    Non-medical: Not on file  Tobacco Use  . Smoking status: Current Every Day Smoker    Packs/day: 0.50    Years: 29.00    Pack years: 14.50    Types: Cigarettes  . Smokeless tobacco: Never Used  . Tobacco comment: 5-10 cigs per day  Substance and Sexual Activity   . Alcohol use: Yes    Alcohol/week: 6.0 standard drinks    Types: 6 Cans of beer per week    Comment: occassional  . Drug use: Yes    Types: "Crack" cocaine, Marijuana, Heroin    Comment: Quit IV drugs in the '80s. Last crack use 2009 or 2010.  Marland Kitchen Sexual activity: Yes    Partners: Female  Lifestyle  . Physical activity    Days per week: Not on file    Minutes per session: Not on file  . Stress: Not on file  Relationships  . Social Herbalist on phone: Not on file    Gets together: Not on file    Attends religious service: Not on file    Active member of club or organization: Not on file    Attends meetings of clubs or organizations: Not on file    Relationship status: Not on file  Other Topics Concern  . Not on file  Social History Narrative   ** Merged History Encounter **    Pt lives in [place].  He stated that his wife left him, but then stated that wife returned home.  Pt denied outpatient provider.   Additional Social History:                         Sleep: Fair  Appetite:  Fair  Current Medications: Current Facility-Administered Medications  Medication Dose Route Frequency Provider Last Rate Last Dose  . Dextromethorphan-quiNIDine (NUEDEXTA) 20-10 MG per capsule 1 capsule  1 capsule Oral BID Johnn Hai, MD   1 capsule at 06/02/19 0912  . haloperidol lactate (HALDOL) injection 10 mg  10 mg Intramuscular BID PRN Johnn Hai, MD   10 mg at 05/31/19 0819  . lisinopril (ZESTRIL) tablet 10 mg  10 mg Oral Daily Cobos, Myer Peer, MD   10 mg at 06/02/19 0912  . LORazepam (ATIVAN) injection 2 mg  2 mg Intramuscular BID PRN Johnn Hai, MD   2 mg at 05/31/19 [id number]  . nicotine (NICODERM CQ - dosed in mg/24 hours) patch 21 mg  21 mg Transdermal Daily Sharma Covert, MD      . nitroGLYCERIN (NITROSTAT) SL tablet 0.4 mg  0.4 mg Sublingual Q5 min PRN Rankin, Shuvon B, NP      . OLANZapine zydis (ZYPREXA) disintegrating tablet 5 mg  5 mg Oral Q8H PRN  Cobos, Myer Peer, MD   5 mg at 05/30/19 2111  . perphenazine (TRILAFON) tablet 8 mg  8 mg Oral BID Johnn Hai, MD  8 mg at 06/02/19 0911  . traZODone (DESYREL) tablet 100 mg  100 mg Oral QHS PRN Lindon Romp A, NP   100 mg at 06/01/19 2140    Lab Results: No results found for this or any previous visit (from the past 48 hour(s)).  Blood Alcohol level:  Lab Results  Component Value Date   ETH <10 [id number]    Metabolic Disorder Labs: Lab Results  Component Value Date   HGBA1C 5.5 05/28/2019   MPG 111.15 05/28/2019   MPG 114 01/28/2015   No results found for: PROLACTIN Lab Results  Component Value Date   CHOL 138 05/28/2019   TRIG 48 05/28/2019   HDL 41 05/28/2019   CHOLHDL 3.4 05/28/2019   VLDL 10 05/28/2019   LDLCALC 87 05/28/2019   LDLCALC 82 01/28/2015    Physical Findings: AIMS: Facial and Oral Movements Muscles of Facial Expression: None, normal Lips and Perioral Area: None, normal Jaw: None, normal Tongue: None, normal,Extremity Movements Upper (arms, wrists, hands, fingers): None, normal Lower (legs, knees, ankles, toes): None, normal, Trunk Movements Neck, shoulders, hips: None, normal, Overall Severity Severity of abnormal movements (highest score from questions above): None, normal Incapacitation due to abnormal movements: None, normal Patient's awareness of abnormal movements (rate only patient's report): No Awareness, Dental Status Current problems with teeth and/or dentures?: No Does patient usually wear dentures?: No  CIWA:  CIWA-Ar Total: 1 COWS:  COWS Total Score: 1  Musculoskeletal: Strength & Muscle Tone: within normal limits Gait & Station: normal Patient leans: N/A  Psychiatric Specialty Exam: Physical Exam  ROS  Blood pressure 126/89, pulse 82, temperature 97.9 F (36.6 C), temperature source Oral, resp. rate 18, height 5\' 10"  (1.778 m), weight 64.9 kg, SpO2 99 %.Body mass index is 20.52 kg/m.  General Appearance: Casual  Eye  Contact:  Fair  Speech:  Clear and Coherent  Volume:  Increased  Mood:  Euthymic  Affect:  Congruent  Thought Process:  Irrelevant and Descriptions of Associations: Loose  Orientation:  Full (Time, Place, and Person)  Thought Content:  Illogical and Delusions again still delusional but minimizing and seems to be genuinely trying to "not think about" this material  Suicidal Thoughts:  No  Homicidal Thoughts:  No  Memory:  Immediate;   Fair Recent;   Fair Remote;   Fair  Judgement:  Impaired  Insight:  Lacking  Psychomotor Activity:  Normal  Concentration:  Concentration: Good and Attention Span: Fair  Recall:  [employer] of Knowledge:  Fair  Language:  Fair  Akathisia:  Negative  Handed:  Right  AIMS (if indicated):     Assets:  Communication Skills Leisure Time Physical Health  ADL's:  Intact  Cognition:  WNL  Sleep:  Number of Hours: 6.5     Treatment Plan Summary: Daily contact with patient to assess and evaluate symptoms and progress in treatment and Medication management  Reality based therapy current precautions continue administer test dose of aripiprazole and then long-acting injectable discharge in the morning  Kasie Leccese, MD 06/02/2019, 1:25 PM

## 2019-06-02 NOTE — Progress Notes (Signed)
Adult Psychoeducational Group Note  Date:  06/02/2019 Time:  11:08 PM  Group Topic/Focus:  Wrap-Up Group:   The focus of this group is to help patients review their daily goal of treatment and discuss progress on daily workbooks.  Participation Level:  Did Not Attend  Participation Quality:  Did Not Attend  Affect:  Did Not Attend  Cognitive:  Did Not Attend  Insight: None  Engagement in Group:  Did Not Attend  Modes of Intervention:  Did Not Attend  Additional Comments:  Pt did not attend evening wake up group tonight.  Candy Sledge 06/02/2019, 11:08 PM

## 2019-06-02 NOTE — Tx Team (Signed)
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 ARIPiprazole (ABILIFY) tablet 2 mg  2 mg Oral Once Johnn Hai, MD      . Derrill Memo ON 06/03/2019] ARIPiprazole ER (ABILIFY MAINTENA) injection 400 mg  400 mg Intramuscular Q28 days Johnn Hai, MD      . Dextromethorphan-quiNIDine (NUEDEXTA) 20-10 MG per capsule 1 capsule  1 capsule Oral BID Johnn Hai, MD   1 capsule at 06/02/19 0912  . haloperidol lactate (HALDOL) injection 10 mg  10 mg Intramuscular BID PRN Johnn Hai, MD   10 mg at 05/31/19 0819  . lisinopril (ZESTRIL) tablet 10 mg  10 mg Oral Daily Cobos, Myer Peer, MD   10 mg at 06/02/19 0912  . LORazepam (ATIVAN) injection 2 mg  2 mg Intramuscular BID PRN Johnn Hai, MD   2 mg at 05/31/19 [id number]  . nicotine (NICODERM CQ - dosed in mg/24 hours) patch 21 mg  21 mg Transdermal Daily Sharma Covert, MD      . nitroGLYCERIN (NITROSTAT) SL tablet 0.4 mg  0.4 mg Sublingual Q5 min PRN Rankin, Shuvon B, NP      . OLANZapine zydis (ZYPREXA) disintegrating tablet 5 mg  5 mg Oral Q8H PRN Cobos, Myer Peer, MD   5 mg at 05/30/19 2111  . perphenazine (TRILAFON) tablet 8 mg  8 mg Oral BID Johnn Hai, MD   8 mg at 06/02/19 0911  . traZODone (DESYREL) tablet 100 mg  100 mg Oral QHS PRN Lindon Romp A, NP   100 mg at 06/01/19 2140   PTA Medications: Medications Prior to Admission  Medication Sig Dispense Refill Last Dose  . Ascorbic Acid (VITAMIN C) 1000 MG tablet Take 1,000 mg by mouth daily.     Marland Kitchen lisinopril (ZESTRIL) 10 MG tablet Take 1 tablet (10 mg total) by mouth daily. 90 tablet 1   . Melatonin 1 MG TABS  Take 1 tablet (1 mg total) by mouth at bedtime. (Patient not taking: Reported on 05/24/2019) 30 tablet 11   . Multiple Vitamin (MULTIVITAMIN WITH MINERALS) TABS tablet Take 1 tablet by mouth daily.     . nitroGLYCERIN (NITROSTAT) 0.4 MG SL tablet Place 1 tablet (0.4 mg total) under the tongue every 5 (five) minutes as needed for chest pain. 25 tablet 1   . oxyCODONE-acetaminophen (PERCOCET/ROXICET) 5-325 MG tablet Take 1 tablet by mouth every 8 (eight) hours as needed for severe pain. 15 tablet 0     Patient Stressors: Loss of wife "she left me but came back" Marital or family conflict  Patient Strengths: Capable of independent living Financial means Supportive family/friends  Treatment Modalities: Medication Management, Group therapy, Case management,  1 to 1 session with clinician, Psychoeducation, Recreational therapy.   Physician Treatment Plan for Primary Diagnosis: <principal problem not specified> Long Term Goal(s): Improvement in symptoms so as ready for discharge Improvement in symptoms so as ready for discharge   Short Term Goals: Ability to identify changes in lifestyle to reduce recurrence of condition will improve Ability to verbalize feelings will improve Ability to disclose and discuss suicidal ideas Ability to demonstrate self-control will improve Ability to identify and develop effective coping behaviors will improve Ability  to maintain clinical measurements within normal limits will improve Compliance with prescribed medications will improve Ability to identify changes in lifestyle to reduce recurrence of condition will improve Ability to verbalize feelings will improve Ability to disclose and discuss suicidal ideas Ability to demonstrate self-control will improve Ability to identify and develop effective coping behaviors will improve Ability to maintain clinical measurements within normal limits will improve  Medication Management: Evaluate patient's response,  side effects, and tolerance of medication regimen.  Therapeutic Interventions: 1 to 1 sessions, Unit Group sessions and Medication administration.  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[place])  Long Term Goal(s): Improvement in symptoms so as ready for discharge Improvement in symptoms so as ready for discharge   Short Term Goals: Ability to identify changes in lifestyle to reduce recurrence of condition will improve Ability to verbalize feelings will improve Ability to disclose and discuss suicidal ideas Ability to demonstrate self-control will improve Ability to identify and develop effective coping behaviors will improve Ability to maintain clinical measurements within normal limits will improve Compliance with prescribed medications will improve Ability to identify changes in lifestyle to reduce recurrence of condition will improve Ability to verbalize feelings will improve Ability to disclose and discuss suicidal ideas Ability to demonstrate self-control will improve Ability to identify and develop effective coping behaviors will improve Ability to maintain clinical measurements within normal limits will improve     Medication Management: Evaluate patient's response, side effects, and tolerance of medication regimen.  Therapeutic Interventions: 1 to 1 sessions, Unit Group sessions and Medication administration.  Evaluation of Outcomes: Progressing   RN Treatment Plan for Primary Diagnosis: <principal problem not specified> Long Term Goal(s): Knowledge of disease and therapeutic regimen to maintain health will improve  Short Term Goals: Ability to participate in decision making will improve, Ability to verbalize feelings will improve, Ability to disclose and discuss suicidal ideas, Ability to identify and develop effective coping behaviors  will improve and Compliance with prescribed medications will improve  Medication Management: RN will administer medications as ordered by provider, will assess and evaluate patient's response and provide education to patient for prescribed medication. RN will report any adverse and/or side effects to prescribing provider.  Therapeutic Interventions: 1 on 1 counseling sessions, Psychoeducation, Medication administration, Evaluate responses to treatment, Monitor vital signs and CBGs as ordered, Perform/monitor CIWA, COWS, AIMS and Fall Risk screenings as ordered, Perform wound care treatments as ordered.  Evaluation of Outcomes: Progressing   LCSW Treatment Plan for Primary Diagnosis: <principal problem not specified> Long Term Goal(s): Safe transition to appropriate next level of care at discharge, Engage patient in therapeutic group addressing interpersonal concerns.  Short Term Goals: Engage patient in aftercare planning with referrals and resources and Increase skills for wellness and recovery  Therapeutic Interventions: Assess for all discharge needs, 1 to 1 time with Social worker, Explore available resources and support systems, Assess for adequacy in community support network, Educate family and significant other(s) on suicide prevention, Complete Psychosocial Assessment, Interpersonal group therapy.  Evaluation of Outcomes: Progressing   Progress in Treatment: Attending groups: Yes. Participating in groups: Yes. Taking medication as prescribed: Yes. Toleration medication: Yes. Family/Significant other contact made: Yes, individual(s) contacted:  pt's wife and mom Patient understands diagnosis: No. Discussing patient identified problems/goals with staff: Yes. Medical problems stabilized or resolved: Yes. Denies suicidal/homicidal ideation: Yes. Issues/concerns per patient self-inventory: No. Other:   New  problem(s) identified: No, Describe:  None  New Short Term/Long Term  Goal(s): Medication stabilization, elimination of SI thoughts, and development of a comprehensive mental wellness plan.   Patient Goals:  Patient did not identify a goal  Discharge Plan or Barriers: Patient will be discharging home and will continue with Internal Medicine. Pt refused mental health aftercare.   Reason for Continuation of Hospitalization: Aggression Delusions  Depression Medication stabilization  Estimated Length of Stay: 2-3 days   Attendees: Patient:  06/02/2019   Physician: Dr. Johnn Hai, MD 06/02/2019   Nursing: Legrand Como, RN 06/02/2019   RN Care Manager: 06/02/2019   Social Worker: Ardelle Anton, LCSW  06/02/2019   Recreational Therapist:  06/02/2019   Other:  06/02/2019   Other:  06/02/2019   Other: 06/02/2019     Scribe for Treatment Team: Trecia Rogers, LCSW 06/02/2019 2:31 PM

## 2019-06-03 MED ORDER — PERPHENAZINE 8 MG PO TABS: ORAL_TABLET | ORAL | 2 refills | Status: DC

## 2019-06-03 MED ORDER — ARIPIPRAZOLE ER 400 MG IM SRER: 400.0000 mg | INTRAMUSCULAR | 11 refills | Status: DC

## 2019-06-03 MED ORDER — ADULT MULTIVITAMIN W/MINERALS CH
1.0000 | ORAL_TABLET | Freq: Every day | ORAL | Status: DC
  Administered 2019-06-03: 10:00:00 1 via ORAL
  Filled 2019-06-03 (×3): qty 7
  Filled 2019-06-03: qty 1

## 2019-06-03 MED ORDER — DEXTROMETHORPHAN-QUINIDINE 20-10 MG PO CAPS: 1.0000 | ORAL_CAPSULE | Freq: Two times a day (BID) | ORAL | 11 refills | Status: DC

## 2019-06-03 MED ORDER — PERPHENAZINE 4 MG PO TABS
8.0000 mg | ORAL_TABLET | Freq: Every day | ORAL | Status: DC
  Filled 2019-06-03 (×2): qty 42

## 2019-06-03 NOTE — Progress Notes (Signed)
D: Pt avoids writer, minimal interaction A: Pt was offered support and encouragement.  Pt was encourage to attend groups. Q 15 minute checks were done for safety.  R: safety maintained on unit.

## 2019-06-03 NOTE — Progress Notes (Signed)
Recreation Therapy Notes  Date: 10.6.20 Time: 1000 Location: 19 Hall Dayroom  Group Topic: Coping Skills  Goal Area(s) Addresses:  Patient will identify positive coping skills Patient will identify benefit of using positive coping skills post d/c.  Behavioral Response: None  Intervention: Worksheet  Activity: Unhealthy vs. Healthy Coping Strategies.  Patients were to identify a problem they are currently dealing with.  Unhealthy coping strategies they have used and the consequences of using them.  Patient would then identify healthy coping strategies and barriers to using those coping skills.  Education: Radiographer, therapeutic, Dentist.   Education Outcome: Acknowledges understanding/In group clarification offered/Needs additional education.   Clinical Observations/Feedback:  Pt was attentive and observed.     Victorino Sparrow, LRT/CTRS     Victorino Sparrow A 06/03/2019 11:24 AM
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
## 2019-06-03 NOTE — Progress Notes (Signed)
Patient ID: [name] [name], male   DOB: Dec 17, 1960, 58 y.o.   MRN: [id number]   CSW contacted pt's mother who stated that pt's wife is now in [facility]. Pt's mother stated that the patient can go to his home and that he is okay to take a Engineer, manufacturing.   CSW attempted to contact pt's wife, twice.  CSW discussed [name] [name] lyft with patient who agreed.   CSW scheduled [place] for 1pm. CSW notified pt's nurse.

## 2019-06-03 NOTE — BHH Suicide Risk Assessment (Signed)
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 rate 18, height 5\' 10"  (1.778 m), weight 64.9 kg, SpO2 99 %.Body mass index is 20.52 kg/m.  General Appearance: Casual  Eye Contact::  Good  Speech:  Clear and Coherent409  Volume:  Normal  Mood:  Euthymic  Affect:  Congruent and Constricted  Thought Process:  Goal Directed and Descriptions of Associations: Circumstantial  Orientation:  Full (Time, Place, and Person)  Thought Content:  Delusions  Suicidal Thoughts:  No  Homicidal Thoughts:  No  Memory:  Immediate;   Fair Recent;   Fair Remote;   Good  Judgement:  Poor  Insight:  Shallow  Psychomotor Activity:  Normal  Concentration:  Fair  Recall:  [employer] of Knowledge:Fair  Language: Fair  Akathisia:  Negative  Handed:  Right  AIMS (if indicated):     Assets:  Leisure Time Resilience Social Support  Sleep:  Number of Hours: 6.75  Cognition: WNL  ADL's:  Intact   Mental Status Per Nursing Assessment::   On Admission:  NA  Demographic Factors:  Male, Caucasian and Unemployed  Loss Factors: Decrease in vocational status and Loss of significant relationship  Historical Factors: Impulsivity  Risk Reduction Factors:   Religious beliefs about death  Continued Clinical Symptoms:  Previous Psychiatric Diagnoses and Treatments  Cognitive Features That Contribute To Risk:  Closed-mindedness and Polarized thinking    Suicide Risk:  Mild:  Suicidal ideation of limited frequency, intensity,  duration, and specificity.  There are no identifiable plans, no associated intent, mild dysphoria and related symptoms, good self-control (both objective and subjective assessment), few other risk factors, and identifiable protective factors, including available and accessible social support.  Follow-up Information    [facility] Internal Medicine Follow up on 06/09/2019.   Why: Follow up appointment is Monday, 10/12 at 2:30p.  Please bring your current medications.  Contact information: [place] [address]  ph: [phone] fx: [phone]          Plan Of Care/Follow-up recommendations:  Activity:  full  Nakshatra Klose, MD 06/03/2019, 8:37 AM

## 2019-06-03 NOTE — Progress Notes (Signed)
D:  Patient denied SI and HI, contracts for safety.   Denied A/V hallucinations.   A:  Medications administered per MD orders.  Emotional support and encouragement givent patient. R:  Safety maintained with 15 minute checks.

## 2019-06-03 NOTE — Progress Notes (Signed)
  [facility] Adult Case Management Discharge Plan :  Will you be returning to the same living situation after discharge:  Yes,  home At discharge, do you have transportation home?: Yes,  [place] lyft at 1:00pm Do you have the ability to pay for your medications: No.  Release of information consent forms completed and in the chart;  Patient's signature needed at discharge.  Patient to Follow up at: Follow-up Information    [facility] Internal Medicine Follow up on 06/09/2019.   Why: Follow up appointment is Monday, 10/12 at 2:30p.  Please bring your current medications.  Contact information: [place] [address]  ph: [phone] fx: [phone]          Next level of care provider has access to [place] and Suicide Prevention discussed: Yes,  pt's wife and mother     Has patient been referred to the Quitline?: Patient refused referral  Patient has been referred for addiction treatment: Yes  Trecia Rogers, LCSW 06/03/2019, 9:41 AM

## 2019-06-03 NOTE — Progress Notes (Signed)
Discharge Note:  Patient discharged in [place].  Patient denied SI and HI.  Denied A/V hallucinations.  Suicide prevention information given and discussed with patient who stated he understood and had no questions.  Patient stated he received all his belongings, clothing, toiletries, misc items, etc.  All required discharge information given to patient at discharge.  Patient stated he appreciated all assistance received from [facility] staff.

## 2019-06-09 ENCOUNTER — Encounter: Payer: Self-pay | Admitting: Internal Medicine

## 2019-06-30 ENCOUNTER — Ambulatory Visit: Payer: Self-pay

## 2019-11-03 ENCOUNTER — Encounter: Payer: Self-pay | Admitting: Internal Medicine

## 2020-01-23 ENCOUNTER — Other Ambulatory Visit: Payer: Self-pay | Admitting: Internal Medicine

## 2020-01-23 MED ORDER — LISINOPRIL 10 MG PO TABS: 10.0000 mg | ORAL_TABLET | Freq: Every day | ORAL | 1 refills | Status: DC

## 2020-01-23 NOTE — Telephone Encounter (Signed)
Refill Request  lisinopril (ZESTRIL) 10 MG tablet  [facility] - [place], [place] - [address]

## 2020-01-23 NOTE — Telephone Encounter (Signed)
Last OV was 04/2019. Called pt to schedule an appt - not at home, talked to his wife who stated he's at his mother's home who's not doing well. Wife stated he also needs an app to see the financial counselor. Call transferred to front office - appt, to see financial counselor and PCP, scheduled on June 28th.

## 2020-02-23 ENCOUNTER — Ambulatory Visit: Payer: Self-pay

## 2020-02-23 ENCOUNTER — Encounter: Payer: Self-pay | Admitting: Internal Medicine

## 2020-03-17 ENCOUNTER — Ambulatory Visit: Payer: Self-pay

## 2020-04-07 ENCOUNTER — Ambulatory Visit: Payer: Self-pay

## 2020-04-08 ENCOUNTER — Ambulatory Visit: Payer: Self-pay

## 2020-04-08 ENCOUNTER — Other Ambulatory Visit: Payer: Self-pay

## 2020-04-28 ENCOUNTER — Other Ambulatory Visit: Payer: Self-pay

## 2020-04-28 ENCOUNTER — Encounter: Payer: Self-pay | Admitting: Internal Medicine

## 2020-04-28 ENCOUNTER — Ambulatory Visit: Payer: Self-pay | Admitting: Internal Medicine

## 2020-04-28 VITALS — BP 130/87 | HR 66 | Temp 98.1°F | Ht 71.0 in | Wt 149.0 lb

## 2020-04-28 DIAGNOSIS — M545 Low back pain, unspecified: Secondary | ICD-10-CM

## 2020-04-28 DIAGNOSIS — X58XXXS Exposure to other specified factors, sequela: Secondary | ICD-10-CM

## 2020-04-28 DIAGNOSIS — F329 Major depressive disorder, single episode, unspecified: Secondary | ICD-10-CM

## 2020-04-28 DIAGNOSIS — S22000S Wedge compression fracture of unspecified thoracic vertebra, sequela: Secondary | ICD-10-CM

## 2020-04-28 DIAGNOSIS — G4700 Insomnia, unspecified: Secondary | ICD-10-CM

## 2020-04-28 DIAGNOSIS — I10 Essential (primary) hypertension: Secondary | ICD-10-CM

## 2020-04-28 DIAGNOSIS — Z0000 Encounter for general adult medical examination without abnormal findings: Secondary | ICD-10-CM

## 2020-04-28 MED ORDER — DICLOFENAC SODIUM 1 % EX GEL: 2.0000 g | Freq: Four times a day (QID) | CUTANEOUS | 3 refills | Status: DC

## 2020-04-28 MED ORDER — TRAZODONE HCL 50 MG PO TABS: 50.0000 mg | ORAL_TABLET | Freq: Every day | ORAL | 0 refills | Status: DC

## 2020-04-28 MED ORDER — DICLOFENAC SODIUM 1 % EX GEL: 2.0000 g | Freq: Four times a day (QID) | CUTANEOUS | Status: DC

## 2020-04-28 NOTE — Patient Instructions (Addendum)
To Mr. [name] [name],   It was a pleasure meeting you today. Today we talked about your back pain, sleep aid, scrotal mass, and . Please continue watching your scrotal cyst for signs of erythema, pain, or swelling. For your sleep aid we will start you on trazodone, please take your medication nightly. Additionally, for your back pain we have prescribed Voltaren gel, please take as needed. We will have you follow up in 4 weeks time.  Sincerely,  Maudie Mercury, MD

## 2020-04-28 NOTE — Progress Notes (Signed)
   CC: Insomnia   HPI:  [name] is a 59 y.o. male who presents to the clinic for insomnia and pain medications. To see the the acute and chronic management of his conditions, please see the separate A&P note under the encounters tab.   Past Medical History:  Diagnosis Date  . Anemia   . Cardiac arrest ([place]) 12/2014  . Depression    Resolved- pt contributes to previous drug use  . Hemorrhoids   . Hepatitis C    S/p completing treatment at [facility]  . IV drug abuse ([place])    Quit. Used in the 47s  . PTSD (post-traumatic stress disorder)    Found deceased brother- resolved per pt  . PULMONARY NODULE 12/21/2009   Stable 25mm per repeat CXR 4/11  . Substance abuse ([place])   . Tobacco abuse    1 ppd x 51 ys. Quit 04/22/12   Review of Systems:  Review of Systems  Constitutional: Negative for chills, malaise/fatigue and weight loss.  Cardiovascular: Negative for leg swelling and PND.       Chest pain specifically after  Several hours after mowing the lawn.   Gastrointestinal: Negative for abdominal pain, constipation, diarrhea, nausea and vomiting.  Musculoskeletal: Positive for back pain. Negative for joint pain and neck pain.    Physical Exam:  Vitals:   04/28/20 1347  BP: 130/87  Pulse: 66  Temp: 98.1 F (36.7 C)  TempSrc: Oral  SpO2: 99%  Weight: 149 lb (67.6 kg)  Height: 5\' 11"  (1.803 m)   Physical Exam Constitutional:      General: He is not in acute distress.    Appearance: He is normal weight.  HENT:     Head: Normocephalic and atraumatic.  Eyes:     General:        Right eye: No discharge.        Left eye: No discharge.     Conjunctiva/sclera: Conjunctivae normal.  Cardiovascular:     Rate and Rhythm: Normal rate and regular rhythm.     Pulses: Normal pulses.     Heart sounds: Normal heart sounds. No murmur heard.  No friction rub. No gallop.   Pulmonary:     Effort: Pulmonary effort is normal.     Breath sounds: Normal breath sounds. No wheezing,  rhonchi or rales.  Abdominal:     General: Abdomen is flat. Bowel sounds are normal.     Palpations: Abdomen is soft.     Tenderness: There is no abdominal tenderness. There is no guarding.     Hernia: No hernia is present.  Neurological:     Mental Status: He is alert.     Assessment & Plan:   See Encounters Tab for problem based charting.  Patient discussed with Dr. Dareen Piano

## 2020-04-29 ENCOUNTER — Encounter: Payer: Self-pay | Admitting: Internal Medicine

## 2020-04-29 LAB — LIPID PANEL
Chol/HDL Ratio: 3.1 ratio (ref 0.0–5.0)
Cholesterol, Total: 146 mg/dL (ref 100–199)
HDL: 47 mg/dL (ref >39)
LDL Chol Calc (NIH): 86 mg/dL (ref 0–99)
Triglycerides: 63 mg/dL (ref 0–149)
VLDL Cholesterol Cal: 13 mg/dL (ref 5–40)

## 2020-04-29 LAB — CMP14 + ANION GAP
ALT: 14 IU/L (ref 0–44)
AST: 25 IU/L (ref 0–40)
Albumin/Globulin Ratio: 1.6 (ref 1.2–2.2)
Albumin: 4.4 g/dL (ref 3.8–4.9)
Alkaline Phosphatase: 79 IU/L (ref 48–121)
Anion Gap: 13 mmol/L (ref 10.0–18.0)
BUN/Creatinine Ratio: 12 (ref 9–20)
BUN: 13 mg/dL (ref 6–24)
Bilirubin Total: 0.3 mg/dL (ref 0.0–1.2)
CO2: 22 mmol/L (ref 20–29)
Calcium: 9.4 mg/dL (ref 8.7–10.2)
Chloride: 104 mmol/L (ref 96–106)
Creatinine, Ser: 1.08 mg/dL (ref 0.76–1.27)
GFR calc Af Amer: 88 mL/min/{1.73_m2} (ref >59)
GFR calc non Af Amer: 76 mL/min/{1.73_m2} (ref >59)
Globulin, Total: 2.8 g/dL (ref 1.5–4.5)
Glucose: 93 mg/dL (ref 65–99)
Potassium: 5.5 mmol/L — ABNORMAL HIGH (ref 3.5–5.2)
Sodium: 139 mmol/L (ref 134–144)
Total Protein: 7.2 g/dL (ref 6.0–8.5)

## 2020-04-29 LAB — CBC
Hematocrit: 46.4 % (ref 37.5–51.0)
Hemoglobin: 16 g/dL (ref 13.0–17.7)
MCH: 29.1 pg (ref 26.6–33.0)
MCHC: 34.5 g/dL (ref 31.5–35.7)
MCV: 85 fL (ref 79–97)
Platelets: 235 10*3/uL (ref 150–450)
RBC: 5.49 x10E6/uL (ref 4.14–5.80)
RDW: 12.8 % (ref 11.6–15.4)
WBC: 9.7 10*3/uL (ref 3.4–10.8)

## 2020-04-29 NOTE — Assessment & Plan Note (Signed)
Patient presents requesting previous dosage of Norco that he used to be on for his compression fracture. He states that the medication helped with his pain, and that he has tried tylenol and other NSAIDs with little effect. We discussed that at this time we should begin with other modalities before discussed chronic opioid usage. We discussed lidocaine patches which the patient has tried, but discontinued because he has sensitive skin. We discussed starting Voltaren Gel which patient agreed to start. He did state that if it does not work he will not come back for follow up.  - Voltaren gel 1% ordered.

## 2020-04-29 NOTE — Assessment & Plan Note (Addendum)
Having difficulty sleeping and falling asleep. Patient states that since starting melatonin he has begun to have dreams about finding his brother dead, his brother passed in 12/14/1991, and that he does not want to continue taking melatonin because of how it gives him these vivid dreams. Patient states that he, at one point took Xanax which helped him sleep well and did not give him any side effects. He states he knows it was a low dose based off the color of the pill. We discussed that Xanax is typically given short term for SSRI medication to take effect, given it side effect profile. He declined wanting to start taking taking Zoloft or Cymbalta during the visit today. We discussed starting Trazodone, which he reluctantly agreed. We discussed follow up in 4 weeks time for follow up, for which he replied, "If it is not working I will not come back." - Trazodone 50 mg at night - Reassess in 4 weeks.

## 2020-04-29 NOTE — Progress Notes (Signed)
Internal Medicine Clinic Attending ? ?Case discussed with Dr. Winters  At the time of the visit.  We reviewed the resident?s history and exam and pertinent patient test results.  I agree with the assessment, diagnosis, and plan of care documented in the resident?s note.  ?

## 2020-04-29 NOTE — Assessment & Plan Note (Signed)
Patient's blood pressure today is  Vitals:   04/28/20 1347  BP: 130/87  Pulse: 66  Temp: 98.1 F (36.7 C)  SpO2: 99%  well managed on lisinopril. Patient adherent to medicaiton.  - Continue lisinopril 10 mg QD

## 2020-04-29 NOTE — Assessment & Plan Note (Signed)
Patient with complex PMH presents to the clinic for a check up. He has a history of HCV, anemia, depression, PTSD. Due to his history will order lab work to initiate potential care for abnormalities.  - Order CMP, CBC, Lipid profile

## 2020-05-12 ENCOUNTER — Ambulatory Visit (INDEPENDENT_AMBULATORY_CARE_PROVIDER_SITE_OTHER): Payer: Self-pay | Admitting: *Deleted

## 2020-05-12 DIAGNOSIS — Z23 Encounter for immunization: Secondary | ICD-10-CM

## 2020-05-14 NOTE — Progress Notes (Addendum)
Cardiology Office Note Date:  05/14/2020  Patient ID:  [name] [name], DOB Jun 09, 1961, MRN [id number] PCP:  Maudie Mercury, MD  Cardiologist/Electrophysiologist: Dr. Caryl Comes    Chief Complaint: past due, lost to follow up, reported device shock (a month prior)  History of Present Illness: [name] [name] is a 59 y.o. 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 Caryl Comes noted: Brugada-flecainide challenge was negative. Signal average was negative. MRI was mildly abnormal in that there was no structural heart disease but a small degree of gadolinium enhancement in the inferior wall   He comes in today to be seen for Dr. Caryl Comes, last seen by him July 2018.  AT that time was doing well without symptoms.  Noted his CM resolved, his coreg stopped and lisinopril increased.  TODAY He is very focused on the fact that his none of his doctors will prescribe his Xanax or pain killers and asks if we wil, he then goes into his life stressors that he thinks contributed to his shock.  He is hard to keep on track, goes into tangents about politics, curses often and relays how frustrated he is with our currnt worl issues. He reports that about 2 mo ago or so he was laying in bed, "stressed to the max" when he was suddenly shocked, "flipped me right over in bed!".  He had no pre or post shock CP, palpitations or syncope.  He jumped up and sat at the edge of the bed, collected himself.The next day he felt like he had generalized aching through his chest.  He did not seek attention, despite inquiring, he is not clear as to why he did not get checked out or call until now. No CP otherwise, no near syncope or syncope He is a bit hard to keep on track, feels any and all symptoms trace back to the lack of his xanax, pain medicines. He reports his transmitter has not worked in at least 2 years  Device information BSCi S-ICD implanted 01/29/2015   Past Medical  History:  Diagnosis Date  . Anemia   . Cardiac arrest ([place]) 12/2014  . Depression    Resolved- pt contributes to previous drug use  . Hemorrhoids   . Hepatitis C    S/p completing treatment at [facility]  . IV drug abuse ([place])    Quit. Used in the 38s  . PTSD (post-traumatic stress disorder)    Found deceased brother- resolved per pt  . PULMONARY NODULE 12/21/2009   Stable 40mm per repeat CXR 4/11  . Substance abuse ([place])   . Tobacco abuse    1 ppd x 7 ys. Quit 04/22/12    Past Surgical History:  Procedure Laterality Date  . CARDIAC CATHETERIZATION N/A 01/26/2015   Procedure: Left Heart Cath and Coronary Angiography;  Surgeon: Leonie Man, MD;  Location: [place] [facility];  Service: Cardiovascular;  Laterality: N/A;  . CYST REMOVAL NECK  2011  . EP IMPLANTABLE DEVICE N/A 01/29/2015   Procedure: SubQ ICD Implant;  Surgeon: Deboraha Sprang, MD;  Location: [place] [facility];  Service: Cardiovascular;  Laterality: N/A;  . FRACTURE SURGERY Left    In high school  . MULTIPLE TOOTH EXTRACTIONS  1999  . WISDOM TOOTH EXTRACTION      Current Outpatient Medications  Medication Sig Dispense Refill  . ARIPiprazole ER (ABILIFY MAINTENA) 400 MG SRER injection Inject 2 mLs (400 mg total) into the muscle every 28 (  twenty-eight) days. Due 07/04/2019 1 each 11  . Ascorbic Acid (VITAMIN C) 1000 MG tablet Take 1,000 mg by mouth daily.    Marland Kitchen Dextromethorphan-quiNIDine (NUEDEXTA) 20-10 MG capsule Take 1 capsule by mouth 2 (two) times daily. 60 capsule 11  . diclofenac Sodium (VOLTAREN) 1 % GEL Apply 2 g topically 4 (four) times daily. 100 g 3  . lisinopril (ZESTRIL) 10 MG tablet Take 1 tablet (10 mg total) by mouth daily. 90 tablet 1  . Melatonin 1 MG TABS Take 1 tablet (1 mg total) by mouth at bedtime. (Patient not taking: Reported on 05/24/2019) 30 tablet 11  . Multiple Vitamin (MULTIVITAMIN WITH MINERALS) TABS tablet Take 1 tablet by mouth daily.    . nitroGLYCERIN (NITROSTAT) 0.4 MG SL tablet Place 1  tablet (0.4 mg total) under the tongue every 5 (five) minutes as needed for chest pain. 25 tablet 1  . perphenazine (TRILAFON) 8 MG tablet 1 in am 2 at hs 90 tablet 2  . traZODone (DESYREL) 50 MG tablet Take 1 tablet (50 mg total) by mouth at bedtime. 30 tablet 0   No current facility-administered medications for this visit.    Allergies:   Patient has no known allergies.   Social History:  The patient  reports that he has been smoking cigarettes. He has a 14.50 pack-year smoking history. He has never used smokeless tobacco. He reports current alcohol use of about 6.0 standard drinks of alcohol per week. He reports current drug use. Drugs: "Crack" cocaine, Marijuana, and Heroin.   Family History:  The patient's family history includes Alcohol abuse in his maternal grandfather; COPD in his mother; Cancer in his maternal grandfather; Colon cancer (age of onset: 68) in his maternal grandfather; Colon polyps (age of onset: 30) in his mother; Heart disease in his father; Hyperlipidemia in his mother; Hypertension in his mother.  ROS:  Please see the history of present illness.    All other systems are reviewed and otherwise negative.   PHYSICAL EXAM:  VS:  There were no vitals taken for this visit. BMI: There is no height or weight on file to calculate BMI. Well nourished, well developed, in no acute distress HEENT: normocephalic, atraumatic Neck: no JVD, carotid bruits or masses Cardiac:  RRR; no significant murmurs, no rubs, or gallops Lungs:  CTA b/l, no wheezing, rhonchi or rales Abd: soft, nontender MS: no deformity or atrophy Ext: no edema Skin: warm and dry, no rash Neuro:  No gross deficits appreciated Psych: euthymic mood, full affect  ICD site is stable, no tethering or discomfort   EKG:  Done today and reviewed by myself shows  SR 65bpm, no St/T changes  Device interrogation done today and reviewed by myself:  Battery is at 15% to ERI Electrode is OK 01/13/2020 he received  one shock Electrogram is coarse AFib > shock > ST PVCs  With industry help SMART PASS was recommended and programmed on to reduce inappropriate therapies.   01/28/2015: c.MRI IMPRESSION: 1. Normal LV size and systolic function, EF 39%. Mild concentric LV hypertrophy. No evidence for hypertrophic cardiomyopathy.  2.  Normal RV size and systolic function, no evidence for ARVC.  3. On delayed enhancement imaging, there was a possible very small area of mid-wall LGE in the mid inferior wall. This was seen only on the short axis images and not reproduced on the axial images. This did not appear to be a coronary disease pattern. This could indicate a small area of scar or inflammation  but is not specific for any particular process.  Dalton Mclean  01/26/2015; LHC 1. Angiographically Normal Coronary Arteries 2. Preserved LV EF with essentially normal LVEDP   Likely non-ischemic etiology for Cardiac Arrest.   Also anticipate that Low EF was post-arrest mediated.  3. Standard post femoral cath care - transferred to Tele 4. Consider EP evaluation for primary arrythmia 5. Continue risk factor modification     01/22/2015: TTE Study Conclusions  - Procedure narrative: Transthoracic echocardiography. Image  quality was adequate. The study was technically difficult.  Intravenous contrast (Definity) was administered.  - Left ventricle: Wall motion is decreased since study of  01/21/2015. Apex and lateral walls move a little better than the  other walls. The inferior septum is dyskinetic. The cavity size  was normal. Wall thickness was increased in a pattern of mild  LVH. The estimated ejection fraction was 35%.  - Right ventricle: The cavity size was normal. Systolic function  was mildly reduced.    Recent Labs: 05/28/2019: TSH 1.866 04/28/2020: ALT 14; BUN 13; Creatinine, Ser 1.08; Hemoglobin 16.0; Platelets 235; Potassium 5.5; Sodium 139  05/28/2019: VLDL 10 04/28/2020:  Chol/HDL Ratio 3.1; Cholesterol, Total 146; HDL 47; LDL Chol Calc (NIH) 86; Triglycerides 63   CrCl cannot be calculated (Unknown ideal weight.).   Wt Readings from Last 3 Encounters:  04/28/20 149 lb (67.6 kg)  05/24/19 150 lb (68 kg)  01/09/19 151 lb 6.4 oz (68.7 kg)     Other studies reviewed: Additional studies/records reviewed today include: summarized above  ASSESSMENT AND PLAN:  1. S-ICD      Device is on early depletion advisory       I discussed the patient and reviewed interrogation with Dr. Quentin Ore.  Agrees is a coarse AFib w/RVR, not true VT/VF Recommends management of his anxiety and pain with his PMD or psychiatrist and 30 day monitoring to evaluate further AF CHA2DS2Vasc is one for HTN, he has hx of NICM that recovered, ? Possibly a score of 2?  I discussed with the patient the findings and recommendations I also discussed with the patient device advisory and alert tone We discussed importance of follow up I have asked industry and our device clinic to follow up on ordering him a new transmitter, the pt confirms we have correct address.  2. NICM     Recovered LVEF by c.MRI (2016)     No symptoms or exam findings of volume OL  3. HTN     No changes today  I informed the patient that we would not be managing his anxiety, chronic back pain medications.  He is initially insistent that we had done this previously though reminded him that we have not had contact with him in 3+ years.  He is urged to d/w his primary provider (he saw them 04/28/20)  but says he has and no one will prescribe them.   Disposition: F/u with our device clinic (given shock plan magnet and wallet card) if he does not get his transmitter in the next week, will have him see Dr. Caryl Comes in 48mo to try and ensure follow up and get him back on track with [place] and f/u on his monitor results as well. His labs 04/28/20 with slightly elevated K+, will repeat and check mag as well  Current medicines are reviewed  at length with the patient today.  The patient did not have any concerns regarding medicines.  Venetia Night, PA-C 05/14/2020 10:44 AM  [place] [address] [phone] (office)  [phone] (fax)

## 2020-05-18 ENCOUNTER — Ambulatory Visit (INDEPENDENT_AMBULATORY_CARE_PROVIDER_SITE_OTHER): Payer: Self-pay | Admitting: Physician Assistant

## 2020-05-18 ENCOUNTER — Encounter: Payer: Self-pay | Admitting: *Deleted

## 2020-05-18 ENCOUNTER — Encounter: Payer: Self-pay | Admitting: Physician Assistant

## 2020-05-18 ENCOUNTER — Other Ambulatory Visit: Payer: Self-pay

## 2020-05-18 VITALS — BP 148/80 | HR 65 | Ht 71.0 in | Wt 150.0 lb

## 2020-05-18 DIAGNOSIS — I469 Cardiac arrest, cause unspecified: Secondary | ICD-10-CM

## 2020-05-18 DIAGNOSIS — I4891 Unspecified atrial fibrillation: Secondary | ICD-10-CM

## 2020-05-18 DIAGNOSIS — Z95810 Presence of automatic (implantable) cardiac defibrillator: Secondary | ICD-10-CM

## 2020-05-18 DIAGNOSIS — I10 Essential (primary) hypertension: Secondary | ICD-10-CM

## 2020-05-18 LAB — BASIC METABOLIC PANEL
BUN/Creatinine Ratio: 10 (ref 9–20)
BUN: 10 mg/dL (ref 6–24)
CO2: 23 mmol/L (ref 20–29)
Calcium: 9.2 mg/dL (ref 8.7–10.2)
Chloride: 102 mmol/L (ref 96–106)
Creatinine, Ser: 1.04 mg/dL (ref 0.76–1.27)
GFR calc Af Amer: 92 mL/min/{1.73_m2} (ref >59)
GFR calc non Af Amer: 79 mL/min/{1.73_m2} (ref >59)
Glucose: 94 mg/dL (ref 65–99)
Potassium: 4.4 mmol/L (ref 3.5–5.2)
Sodium: 139 mmol/L (ref 134–144)

## 2020-05-18 LAB — MAGNESIUM: Magnesium: 2.1 mg/dL (ref 1.6–2.3)

## 2020-05-18 NOTE — Progress Notes (Signed)
Patient ID: [name] [name], male   DOB: 09/19/1960, 59 y.o.   MRN: [id number] [employer] enrolled for [employer] to ship a 30 day cardiac event monitor to his home.

## 2020-05-18 NOTE — Patient Instructions (Addendum)
Medication Instructions:   Your physician recommends that you continue on your current medications as directed. Please refer to the Current Medication list given to you today.   *If you need a refill on your cardiac medications before your next appointment, please call your pharmacy*   Lab Work:  [facility] AND [facility]    If you have labs (blood work) drawn today and your tests are completely normal, you will receive your results only by: Marland Kitchen MyChart Message (if you have MyChart) OR . A paper copy in the mail If you have any lab test that is abnormal or we need to change your treatment, we will call you to review the results.   Testing/Procedures:  Your physician has recommended that you wear an event monitor. Event monitors are medical devices that record the heart's electrical activity. Doctors most often [place] these monitors to diagnose arrhythmias. Arrhythmias are problems with the speed or rhythm of the heartbeat. The monitor is a small, portable device. You can wear one while you do your normal daily activities. This is usually used to diagnose what is causing palpitations/syncope (passing out).    Follow-Up: At [facility], you and your health needs are our priority.  As part of our continuing mission to provide you with exceptional heart care, we have created designated Provider Care Teams.  These Care Teams include your primary Cardiologist (physician) and Advanced Practice Providers (APPs -  Physician Assistants and Nurse Practitioners) who all work together to provide you with the care you need, when you need it.  We recommend signing up for the patient portal called "MyChart".  Sign up information is provided on this After Visit Summary.  MyChart is used to connect with patients for Virtual Visits (Telemedicine).  Patients are able to view lab/test results, encounter notes, upcoming appointments, etc.  Non-urgent messages can be sent to your provider as well.   To learn more about what  you can do with MyChart, go to [email or web address].    Your next appointment:   2 month(s)  The format for your next appointment:   In Person  Provider:    You may see Dr. Franchot Erichsen     Other Instructions   [employer] Cardiac Event Monitor Instructions Your physician has requested you wear your cardiac event monitor for ___30__ days, (1-30). [employer] may call or text to confirm a shipping address. The monitor will be sent to a land address via UPS. [employer] will not ship a monitor to a PO BOX. It typically takes 3-5 days to receive your monitor after it has been enrolled. [employer] will assist with USPS tracking if your package is delayed. The telephone number for [employer] is [phone]. Once you have received your monitor, please review the enclosed instructions. Instruction tutorials can also be viewed under help and settings on the enclosed cell phone. Your monitor has already been registered assigning a specific monitor serial # to you.  Applying the monitor Remove cell phone from case and turn it on. The cell phone works as Dealer and needs to be within [employer] of you at all times. The cell phone will need to be charged on a daily basis. We recommend you plug the cell phone into the enclosed charger at your bedside table every night.  Monitor batteries: You will receive two monitor batteries labelled #1 and #2. These are your recorders. Plug battery #2 onto the second connection on the enclosed charger. Keep one battery on the charger at all times.  This will keep the monitor battery deactivated. It will also keep it fully charged for when you need to switch your monitor batteries. A small light will be blinking on the battery emblem when it is charging. The light on the battery emblem will remain on when the battery is fully charged.  Open package of a Monitor strip. Insert battery #1 into black hood on strip and gently squeeze monitor battery onto  connection as indicated in instruction booklet. Set aside while preparing skin.  Choose location for your strip, vertical or horizontal, as indicated in the instruction booklet. Shave to remove all hair from location. There cannot be any lotions, oils, powders, or colognes on skin where monitor is to be applied. Wipe skin clean with enclosed Saline wipe. Dry skin completely.  Peel paper labeled #1 off the back of the Monitor strip exposing the adhesive. Place the monitor on the chest in the vertical or horizontal position shown in the instruction booklet. One arrow on the monitor strip must be pointing upward. Carefully remove paper labeled #2, attaching remainder of strip to your skin. Try not to create any folds or wrinkles in the strip as you apply it.  Firmly press and release the circle in the center of the monitor battery. You will hear a small beep. This is turning the monitor battery on. The heart emblem on the monitor battery will light up every 5 seconds if the monitor battery in turned on and connected to the patient securely. Do not push and hold the circle down as this turns the monitor battery off. The cell phone will locate the monitor battery. A screen will appear on the cell phone checking the connection of your monitor strip. This may read poor connection initially but change to good connection within the next minute. Once your monitor accepts the connection you will hear a series of 3 beeps followed by a climbing crescendo of beeps. A screen will appear on the cell phone showing the two monitor strip placement options. Touch the picture that demonstrates where you applied the monitor strip.  Your monitor strip and battery are waterproof. You are able to shower, bathe, or swim with the monitor on. They just ask you do not submerge deeper than 3 feet underwater. We recommend removing the monitor if you are swimming in a lake, river, or ocean.  Your monitor battery will need  to be switched to a fully charged monitor battery approximately once a week. The cell phone will alert you of an action which needs to be made.  On the cell phone, tap for details to reveal connection status, monitor battery status, and cell phone battery status. The green dots indicates your monitor is in good status. A red dot indicates there is something that needs your attention.  To record a symptom, click the circle on the monitor battery. In 30-60 seconds a list of symptoms will appear on the cell phone. Select your symptom and tap save. Your monitor will record a sustained or significant arrhythmia regardless of you clicking the button. Some patients do not feel the heart rhythm irregularities. [employer] will notify us of any serious or critical events.  Refer to instruction booklet for instructions on switching batteries, changing strips, the Do not disturb or Pause features, or any additional questions.  Call [employer] at [phone], to confirm your monitor is transmitting and record your baseline. They will answer any questions you may have regarding the monitor instructions at that time.  Returning the monitor  to [employer] Place all equipment back into blue box. Peel off strip of paper to expose adhesive and close box securely. There is a prepaid UPS shipping label on this box. Drop in a UPS drop box, or at a UPS facility like [employer]. You may also contact [employer] to arrange UPS to pick up monitor package at your home.

## 2020-05-24 ENCOUNTER — Ambulatory Visit (INDEPENDENT_AMBULATORY_CARE_PROVIDER_SITE_OTHER): Payer: Self-pay

## 2020-05-24 DIAGNOSIS — I469 Cardiac arrest, cause unspecified: Secondary | ICD-10-CM

## 2020-05-24 DIAGNOSIS — I4891 Unspecified atrial fibrillation: Secondary | ICD-10-CM

## 2020-05-31 ENCOUNTER — Encounter: Payer: Self-pay | Admitting: Internal Medicine

## 2020-06-10 ENCOUNTER — Ambulatory Visit (INDEPENDENT_AMBULATORY_CARE_PROVIDER_SITE_OTHER): Payer: Self-pay

## 2020-06-10 DIAGNOSIS — I469 Cardiac arrest, cause unspecified: Secondary | ICD-10-CM

## 2020-06-10 LAB — CUP PACEART REMOTE DEVICE CHECK
Battery Remaining Percentage: 13 %
Date Time Interrogation Session: 20211013181200
Implantable Lead Implant Date: 20160603
Implantable Lead Location: 753862
Implantable Lead Model: 3401
Implantable Pulse Generator Implant Date: 20160603
Pulse Gen Serial Number: 115885

## 2020-06-15 NOTE — Progress Notes (Signed)
Remote ICD transmission.   

## 2020-07-20 DIAGNOSIS — Z95810 Presence of automatic (implantable) cardiac defibrillator: Secondary | ICD-10-CM | POA: Insufficient documentation

## 2020-07-20 DIAGNOSIS — I4891 Unspecified atrial fibrillation: Secondary | ICD-10-CM | POA: Insufficient documentation

## 2020-07-21 ENCOUNTER — Other Ambulatory Visit: Payer: Self-pay

## 2020-07-21 ENCOUNTER — Encounter: Payer: Self-pay | Admitting: Internal Medicine

## 2020-07-21 ENCOUNTER — Ambulatory Visit (INDEPENDENT_AMBULATORY_CARE_PROVIDER_SITE_OTHER): Payer: Self-pay | Admitting: Internal Medicine

## 2020-07-21 VITALS — BP 112/74 | HR 63 | Ht 71.0 in | Wt 141.0 lb

## 2020-07-21 DIAGNOSIS — I4891 Unspecified atrial fibrillation: Secondary | ICD-10-CM

## 2020-07-21 DIAGNOSIS — I428 Other cardiomyopathies: Secondary | ICD-10-CM

## 2020-07-21 DIAGNOSIS — Z95810 Presence of automatic (implantable) cardiac defibrillator: Secondary | ICD-10-CM

## 2020-07-21 LAB — CUP PACEART INCLINIC DEVICE CHECK
Date Time Interrogation Session: 20211124100946
Implantable Lead Implant Date: 20160603
Implantable Lead Location: 753862
Implantable Lead Model: 3401
Implantable Pulse Generator Implant Date: 20160603
Pulse Gen Serial Number: 115885

## 2020-07-21 NOTE — Patient Instructions (Signed)
Medication Instructions:  Your physician recommends that you continue on your current medications as directed. Please refer to the Current Medication list given to you today.  *If you need a refill on your cardiac medications before your next appointment, please call your pharmacy*   Lab Work: None  If you have labs (blood work) drawn today and your tests are completely normal, you will receive your results only by: Marland Kitchen MyChart Message (if you have MyChart) OR . A paper copy in the mail If you have any lab test that is abnormal or we need to change your treatment, we will call you to review the results.   Testing/Procedures: None   Follow-Up:  Remote monitoring is used to monitor your ICD from home. This monitoring reduces the number of office visits required to check your device to one time per year. It allows [place] to keep an eye on the functioning of your device to ensure it is working properly. You are scheduled for a device check from home on 09/12/20. You may send your transmission at any time that day. If you have a wireless device, the transmission will be sent automatically. After your physician reviews your transmission, you will receive a postcard with your next transmission date.   At [facility], you and your health needs are our priority.  As part of our continuing mission to provide you with exceptional heart care, we have created designated Provider Care Teams.  These Care Teams include your primary Cardiologist (physician) and Advanced Practice Providers (APPs -  Physician Assistants and Nurse Practitioners) who all work together to provide you with the care you need, when you need it.  We recommend signing up for the patient portal called "MyChart".  Sign up information is provided on this After Visit Summary.  MyChart is used to connect with patients for Virtual Visits (Telemedicine).  Patients are able to view lab/test results, encounter notes, upcoming appointments, etc.   Non-urgent messages can be sent to your provider as well.   To learn more about what you can do with MyChart, go to [email or web address].    Your next appointment:   6 month(s)  The format for your next appointment:   In Person  Provider:   You may see Jolyn Nap, MD or one of the following Advanced Practice Providers on your designated Care Team:    Chanetta Marshall, NP  Tommye Standard, [place]  Legrand Como "Jonni Sanger" [place], [place]    Other Instructions None

## 2020-07-21 NOTE — Progress Notes (Signed)
Patient Care Team: Maudie Mercury, MD as PCP - General   HPI  [name] [name] is a 59 y.o. 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 With SICD 2016   Ejection fraction initially was 35% with subsequent normalization. Catheterization 5/16 demonstrated no obstructive coronary disease and normalization of LV function  Brugada-flecainide challenge was negative.  Signal average was negative.  MRI was mildly abnormal in that there was no structural heart disease but a small degree of gadolinium enhancement in the inferior wall   Without chest pain or shortness of breath but under significant stress. -acknowledges anger denies active suicidality    Records and Results Reviewed  Past Medical History:  Diagnosis Date  . Anemia   . Cardiac arrest ([place]) 12/2014  . Depression    Resolved- pt contributes to previous drug use  . Hemorrhoids   . Hepatitis C    S/p completing treatment at [facility]  . IV drug abuse ([place])    Quit. Used in the 20s  . PTSD (post-traumatic stress disorder)    Found deceased brother- resolved per pt  . PULMONARY NODULE 12/21/2009   Stable 66mm per repeat CXR 4/11  . Substance abuse ([employer])   . Tobacco abuse    1 ppd x 56 ys. Quit 04/22/12    Past Surgical History:  Procedure Laterality Date  . CARDIAC CATHETERIZATION N/A 01/26/2015   Procedure: Left Heart Cath and Coronary Angiography;  Surgeon: Leonie Man, MD;  Location: [place] [facility];  Service: Cardiovascular;  Laterality: N/A;  . CYST REMOVAL NECK  2011  . EP IMPLANTABLE DEVICE N/A 01/29/2015   Procedure: SubQ ICD Implant;  Surgeon: Deboraha Sprang, MD;  Location: [place] [facility];  Service: Cardiovascular;  Laterality: N/A;  . FRACTURE SURGERY Left    In high school  . MULTIPLE TOOTH EXTRACTIONS  1999  . WISDOM TOOTH EXTRACTION      Current Outpatient Medications  Medication Sig Dispense Refill  .  Ascorbic Acid (VITAMIN C) 1000 MG tablet Take 1,000 mg by mouth daily.    Marland Kitchen lisinopril (ZESTRIL) 10 MG tablet Take 1 tablet (10 mg total) by mouth daily. 90 tablet 1  . Multiple Vitamin (MULTIVITAMIN WITH MINERALS) TABS tablet Take 1 tablet by mouth daily.    . nitroGLYCERIN (NITROSTAT) 0.4 MG SL tablet Place 1 tablet (0.4 mg total) under the tongue every 5 (five) minutes as needed for chest pain. 25 tablet 1   No current facility-administered medications for this visit.    No Known Allergies    Review of Systems negative except from HPI and PMH  Physical Exam BP 112/74   Pulse 63   Ht 5\' 11"  (1.803 m)   Wt 141 lb (64 kg)   SpO2 98%   BMI 19.67 kg/m  Well developed and well nourished in no acute distress HENT normal Neck supple with JVP-flat Device pocket well healed; without hematoma or erythema.  There is no tethering  Regular rate and rhythm, no  murmur Abd-soft with active BS No Clubbing cyanosis   edema Skin-warm and dry A & Oriented  Grossly normal sensory and motor function  ECG sinus at 63 Intervals 15/09/38  Aborted Cardiac Arrest  Hypertension   SICD  The patient's device was interrogated.  The information was reviewed. No changes were made in the programming.    Stress/anger  Smoking    No interval  arrhythmias.  Begins our conversation by asking for benzodiazepines for stress.  Quite agitated as we talked about the role of his wife and stress.  Describes a couple of efforts in the past using psychotropics which were not well-tolerated.  Acknowledges anger.  Encouraged him to follow-up with his primary care to consider mood stabilizing medication.  Disinclined.  I told her if he changes mind I would call over there on his behalf.

## 2020-09-09 ENCOUNTER — Ambulatory Visit (INDEPENDENT_AMBULATORY_CARE_PROVIDER_SITE_OTHER): Payer: Self-pay

## 2020-09-09 DIAGNOSIS — I469 Cardiac arrest, cause unspecified: Secondary | ICD-10-CM

## 2020-09-13 ENCOUNTER — Telehealth: Payer: Self-pay

## 2020-09-13 LAB — CUP PACEART REMOTE DEVICE CHECK
Battery Remaining Percentage: 3 %
Date Time Interrogation Session: 20220115003100
Implantable Lead Implant Date: 20160603
Implantable Lead Location: 753862
Implantable Lead Model: 3401
Implantable Pulse Generator Implant Date: 20160603
Pulse Gen Serial Number: 115885

## 2020-09-13 NOTE — Telephone Encounter (Signed)
Called patient to advise about increasing remote battery checks. Next remote scheduled 10/14/20.   No answer. Phone rang continuously. Unable to leave VM.

## 2020-09-21 NOTE — Telephone Encounter (Signed)
Contacted patient by phone in reference to monthly battery checks that were scheduled for his device. Remote transmission received on 09/11/2020 showing remaining battery life to ERI 3 percent. Patient sends manual remote transmissions. Attempted to give patient the dates for manual sends and patient stated that he did not want monthly battery checks. Patient was given the next scheduled date of 10/14/2020 and did not wish to receive further dates. Patient advised that he wanted his ICD device removed as soon as possible preferably in March of 2022 or earlier. Patient adamant about device removal. Advised patient that I would send this information to Dr. Caryl Comes.

## 2020-09-23 ENCOUNTER — Telehealth: Payer: Self-pay
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
## 2020-09-23 NOTE — Telephone Encounter (Signed)
I agree. Thank you.

## 2020-09-23 NOTE — Telephone Encounter (Signed)
Yes, this Pt has been triggered by his Wife's Dx & leaving. Good guidance Sports administrator

## 2020-09-23 NOTE — Progress Notes (Signed)
Remote ICD transmission.   

## 2020-09-27 NOTE — Telephone Encounter (Signed)
Janett Billow  thanks maybe we can bring him in next month and we can have a discussion We can also do it be telehealth Thanks SK

## 2020-09-28 ENCOUNTER — Telehealth: Payer: Self-pay | Admitting: Emergency Medicine

## 2020-09-28 NOTE — Telephone Encounter (Signed)
Patient notified he has reached ERI.  He reports he still wants device and leads removed as voiced on 09/21/20. He is aware that he will be scheduled to see DR Caryl Comes to discuss the generator change and his concerns.

## 2020-09-28 NOTE — Telephone Encounter (Signed)
Patient information sent to scheduling to set up an appointment.

## 2020-10-01 ENCOUNTER — Encounter: Payer: Self-pay | Admitting: Student

## 2020-10-01 ENCOUNTER — Telehealth: Payer: Self-pay | Admitting: *Deleted

## 2020-10-01 NOTE — Telephone Encounter (Signed)
Courtesy call made to patient (no showed for scheduled appt earlier this am with [facility]). No answer, message left on recorder for return call to reschedule appt.Despina Hidden Cassady2/4/20229:52 AM

## 2020-10-17 ENCOUNTER — Other Ambulatory Visit: Payer: Self-pay | Admitting: Internal Medicine

## 2020-10-19 ENCOUNTER — Other Ambulatory Visit: Payer: Self-pay

## 2020-10-19 ENCOUNTER — Telehealth: Payer: Self-pay | Admitting: Internal Medicine

## 2020-10-19 DIAGNOSIS — Z95810 Presence of automatic (implantable) cardiac defibrillator: Secondary | ICD-10-CM

## 2020-10-19 DIAGNOSIS — I4891 Unspecified atrial fibrillation: Secondary | ICD-10-CM

## 2020-10-19 DIAGNOSIS — I428 Other cardiomyopathies: Secondary | ICD-10-CM

## 2020-10-19 DIAGNOSIS — Z8674 Personal history of sudden cardiac arrest: Secondary | ICD-10-CM

## 2020-11-11 ENCOUNTER — Ambulatory Visit (INDEPENDENT_AMBULATORY_CARE_PROVIDER_SITE_OTHER): Payer: Self-pay

## 2020-11-11 DIAGNOSIS — I469 Cardiac arrest, cause unspecified: Secondary | ICD-10-CM

## 2020-11-12 LAB — CUP PACEART REMOTE DEVICE CHECK
Date Time Interrogation Session: 20220318122414
Implantable Lead Implant Date: 20160603
Implantable Lead Location: 753862
Implantable Lead Model: 3401
Implantable Pulse Generator Implant Date: 20160603
Pulse Gen Serial Number: 115885

## 2020-11-16 ENCOUNTER — Telehealth: Payer: Self-pay

## 2020-11-16 NOTE — Telephone Encounter (Signed)
If no response send certified letter plz

## 2020-11-16 NOTE — Telephone Encounter (Signed)
Certified letter sent.

## 2020-11-16 NOTE — Telephone Encounter (Signed)
Latitude alert received, battery at EOL: " WARNING: Device battery has reached End of Life (EOL). Device therapies and diagnostics may be limited. LATITUDE is no longer able to monitor this patient's device. Schedule replacement of this device. On Nov 16, 2020, data was received that indicated the following device status: End of Life (EOL) reached on Nov 13, 2020."   Patient did not show for virtual apt. With Dr. Caryl Comes. Attempted to reach patient via both numbers on file. No answer, LMOVM.

## 2020-11-19 NOTE — Addendum Note (Signed)
Addended by: Cheri Kearns A on: 11/19/2020 10:06 AM   Modules accepted: Level of Service

## 2020-11-19 NOTE — Progress Notes (Signed)
Remote ICD transmission.   

## 2021-01-02 ENCOUNTER — Other Ambulatory Visit: Payer: Self-pay

## 2021-01-02 ENCOUNTER — Ambulatory Visit (HOSPITAL_COMMUNITY)
Admission: EM | Admit: 2021-01-02 | Discharge: 2021-01-02 | Disposition: A | Discharge status: Home / Self Care | Payer: No Payment, Other | Attending: Psychiatry | Admitting: Psychiatry

## 2021-01-02 DIAGNOSIS — R4689 Other symptoms and signs involving appearance and behavior: Secondary | ICD-10-CM

## 2021-01-02 DIAGNOSIS — R456 Violent behavior: Secondary | ICD-10-CM | POA: Insufficient documentation

## 2021-01-02 DIAGNOSIS — Z634 Disappearance and death of family member: Secondary | ICD-10-CM | POA: Insufficient documentation

## 2021-01-02 DIAGNOSIS — F1410 Cocaine abuse, uncomplicated: Secondary | ICD-10-CM | POA: Insufficient documentation

## 2021-01-02 NOTE — ED Notes (Signed)
Pt discharged in no acute distress. Denied SI/HI/AVH. A&O x4, ambulatory. Verbalized understanding of AVS instructions. Belongings returned to pt intact. Pt requested to wait outside for [employer] to pick him up so that he could smoke. Staff escorted pt outside. Safety maintained.

## 2021-01-02 NOTE — Discharge Instructions (Signed)
Take all medications as prescribed. Keep all follow-up appointments as scheduled.  Do not consume alcohol or use illegal drugs while on prescription medications. Report any adverse effects from your medications to your primary care provider promptly.  In the event of recurrent symptoms or worsening symptoms, call 911, a crisis hotline, or go to the nearest emergency department for evaluation.   
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
## 2021-01-02 NOTE — Progress Notes (Signed)
Patient was brought to the [facility] by [employer], petitioned by a family member who reported that patient is not taking his mental health medications, he is abusing drugs and he is delusional. Patient states that he was petitioned as a result of an argument with family members.  He denies SI/HI/Psychosis.  He does, however, admit to recent use of cocaine and marijuana as well as occasional alcohol.  Patient states that his wife died in 29-Oct-2022 from an overdose and he has been struggling with the loss.  Patient states that he was admitted to [facility] six months ago and states that he was prescribed Abilify, but states that he stopped taking it because he could not sit still on it.  Patient is lucid and cooperative and denying any thoughts of wanting to hurt himself or others.  He does admit that he has struggled with addiction for years and states that he has never sought help for it.  Patient does not feel like he needs to be in the hospital for any mental health issues.

## 2021-01-02 NOTE — ED Notes (Signed)
[employer] called to transport pt home 

## 2021-02-27 ENCOUNTER — Other Ambulatory Visit: Payer: Self-pay | Admitting: Student

## 2021-03-01 ENCOUNTER — Encounter: Payer: Self-pay | Admitting: *Deleted

## 2021-04-11 ENCOUNTER — Encounter: Payer: Self-pay | Admitting: Internal Medicine

## 2021-05-07 ENCOUNTER — Encounter (HOSPITAL_COMMUNITY): Payer: Self-pay

## 2021-05-07 ENCOUNTER — Other Ambulatory Visit: Payer: Self-pay

## 2021-05-07 ENCOUNTER — Emergency Department (HOSPITAL_COMMUNITY)
Admission: EM | Admit: 2021-05-07 | Discharge: 2021-05-07 | Disposition: A | Discharge status: Home / Self Care | Payer: Self-pay | Attending: Emergency Medicine | Admitting: Emergency Medicine

## 2021-05-07 ENCOUNTER — Emergency Department (HOSPITAL_COMMUNITY): Payer: Self-pay

## 2021-05-07 DIAGNOSIS — I4891 Unspecified atrial fibrillation: Secondary | ICD-10-CM | POA: Insufficient documentation

## 2021-05-07 DIAGNOSIS — I10 Essential (primary) hypertension: Secondary | ICD-10-CM | POA: Insufficient documentation

## 2021-05-07 DIAGNOSIS — M25562 Pain in left knee: Secondary | ICD-10-CM | POA: Insufficient documentation

## 2021-05-07 DIAGNOSIS — F17210 Nicotine dependence, cigarettes, uncomplicated: Secondary | ICD-10-CM | POA: Insufficient documentation

## 2021-05-07 MED ORDER — OXYCODONE-ACETAMINOPHEN 5-325 MG PO TABS
1.0000 | ORAL_TABLET | ORAL | Status: DC | PRN
  Administered 2021-05-07: 1 via ORAL
  Filled 2021-05-07: qty 1

## 2021-05-07 MED ORDER — HYDROCODONE-ACETAMINOPHEN 5-325 MG PO TABS: 1.0000 | ORAL_TABLET | Freq: Four times a day (QID) | ORAL | 0 refills | Status: DC | PRN

## 2021-05-07 MED ORDER — DICLOFENAC SODIUM 1 % EX GEL: 2.0000 g | Freq: Four times a day (QID) | CUTANEOUS | 0 refills | Status: DC
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
## 2021-05-07 NOTE — Discharge Instructions (Signed)
You were seen in the emergency room today with left knee pain.  We have applied a brace but I would like for you to take this brace off frequently and move the knee to keep it loose.  You may use the brace as needed for comfort.  Called in a very small number of pain medications to the pharmacy.  You may take Tylenol and use the Voltaren gel as needed.  Please call the cardiology doctor listed to schedule a follow-up appointment.  Have also listed a primary care doctor who can help you with your other chronic medical issues and with establishing insurance for further treatment.
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
## 2021-05-07 NOTE — ED Notes (Signed)
Ortho tech paged  

## 2021-05-07 NOTE — Progress Notes (Signed)
Orthopedic Tech Progress Note Patient Details:  [name] [name] [birth year] [id number]  Ortho Devices Type of Ortho Device: Knee Immobilizer Ortho Device/Splint Location: Left knee Ortho Device/Splint Interventions: Application   Post Interventions Patient Tolerated: Well  [name] [name] [name] [name] 05/07/2021, 3:56 PM

## 2021-06-15 ENCOUNTER — Other Ambulatory Visit: Payer: Self-pay | Admitting: Student

## 2021-06-21 ENCOUNTER — Encounter: Payer: Self-pay | Admitting: Internal Medicine

## 2021-06-21 NOTE — Telephone Encounter (Signed)
Patient sent letter asking to please get in contact with the clinic to schedule an appointment.

## 2021-07-15 ENCOUNTER — Encounter: Payer: Self-pay | Admitting: Student

## 2021-10-21 ENCOUNTER — Other Ambulatory Visit: Payer: Self-pay | Admitting: Internal Medicine

## 2021-10-24 ENCOUNTER — Encounter: Payer: Self-pay | Admitting: *Deleted

## 2021-10-24 NOTE — Telephone Encounter (Signed)
Called pt to schedule an appt - no answer. My Chart message sent .

## 2021-10-25 NOTE — Telephone Encounter (Signed)
Please schedule pt an appt - last OV was 04/2020. Thanks

## 2021-11-17 ENCOUNTER — Encounter: Payer: Self-pay | Admitting: Internal Medicine

## 2022-01-31 ENCOUNTER — Other Ambulatory Visit: Payer: Self-pay | Admitting: Internal Medicine

## 2022-01-31 NOTE — Telephone Encounter (Signed)
LOV 04/2020 - called pt ; no answer nor vm.

## 2022-05-08 NOTE — Progress Notes (Deleted)
Electrophysiology Office Note Date: 05/08/2022  ID:  [name] [name], DOB Apr 02, 1961, MRN [id number]  PCP: No primary care provider on file. Primary Cardiologist: None Electrophysiologist: Virl Axe, MD   CC: Routine ICD follow-up  [name] [name] is a 61 y.o. male seen today for Virl Axe, MD for routine electrophysiology followup.  last being seen in our clinic the patient reports doing ***.  he denies chest pain, palpitations, dyspnea, PND, orthopnea, nausea, vomiting, dizziness, syncope, edema, weight gain, or early satiety.     {He/she (caps):30048} has not had ICD shocks.   Device History: BSCi S-ICD implanted 01/29/2015  Past Medical History:  Diagnosis Date   Anemia    Cardiac arrest ([place]) 12/2014   Depression    Resolved- pt contributes to previous drug use   Hemorrhoids    Hepatitis C    S/p completing treatment at [facility]   IV drug abuse ([place])    Quit. Used in the 80s   PTSD (post-traumatic stress disorder)    Found deceased brother- resolved per pt   PULMONARY NODULE 12/21/2009   Stable 78m per repeat CXR 4/11   Substance abuse ([place]    Tobacco abuse    1 ppd x 225ys. Quit 04/22/12   Past Surgical History:  Procedure Laterality Date   CARDIAC CATHETERIZATION N/A 01/26/2015   Procedure: Left Heart Cath and Coronary Angiography;  Surgeon: DLeonie Man MD;  [place];  Service: Cardiovascular;  Laterality: N/A;   CYST REMOVAL NECK  2011   EP IMPLANTABLE DEVICE N/A 01/29/2015   Procedure: SubQ ICD Implant;  Surgeon: SDeboraha Sprang MD;  [place] [facility] [place];  Service: Cardiovascular;  Laterality: N/A;   FRACTURE SURGERY Left    In high school   MULTIPLE TOOTH EXTRACTIONS  1999   WISDOM TOOTH EXTRACTION      Current Outpatient Medications  Medication Sig Dispense Refill   Ascorbic Acid (VITAMIN C) 1000 MG tablet Take 1,000 mg by mouth daily.     diclofenac Sodium (VOLTAREN) 1 % GEL Apply 2 g topically 4 (four) times daily.  100 g 0   HYDROcodone-acetaminophen (NORCO/VICODIN) 5-325 MG tablet Take 1 tablet by mouth every 6 (six) hours as needed for severe pain. 5 tablet 0   lisinopril (ZESTRIL) 10 MG tablet Take 1 tablet by mouth once daily 90 tablet 0   Multiple Vitamin (MULTIVITAMIN WITH MINERALS) TABS tablet Take 1 tablet by mouth daily.     nitroGLYCERIN (NITROSTAT) 0.4 MG SL tablet Place 1 tablet (0.4 mg total) under the tongue every 5 (five) minutes as needed for chest pain. 25 tablet 1   No current facility-administered medications for this visit.    Allergies:   Patient has no known allergies.   Social History: Social History   Socioeconomic History   Marital status: Married    Spouse name: Not on file   Number of children: Not on file   Years of education: Not on file   Highest education level: Not on file  Occupational History   Not on file  Tobacco Use   Smoking status: Every Day    Packs/day: 0.50    Years: 29.00    Total pack years: 14.50    Types: Cigarettes   Smokeless tobacco: Never   Tobacco comments:    .50  Vaping Use   Vaping Use: Never used  Substance and Sexual Activity   Alcohol use: Yes    Alcohol/week: 6.0 standard drinks  of alcohol    Types: 6 Cans of beer per week    Comment: occassional   Drug use: Yes    Types: "Crack" cocaine, Marijuana, Heroin    Comment: Quit IV drugs in the '80s. Last crack use 2009 or 2010.   Sexual activity: Yes    Partners: Female  Other Topics Concern   Not on file  Social History Narrative   ** Merged History Encounter **    Pt lives in [place].  He stated that his wife left him, but then stated that wife returned home.  Pt denied outpatient provider.   Social Determinants of Health   Financial Resource Strain: Not on file  Food Insecurity: Not on file  Transportation Needs: Not on file  Physical Activity: Not on file  Stress: Not on file  Social Connections: Not on file  Intimate Partner Violence: Not on file    Family  History: Family History  Problem Relation Age of Onset   Hyperlipidemia Mother    Hypertension Mother    COPD Mother    Colon polyps Mother 47   Heart disease Father        Decease from MI at 8yo   Cancer Maternal Grandfather        Colon cancer, deceased   Alcohol abuse Maternal Grandfather    Colon cancer Maternal Grandfather 100   Esophageal cancer Neg Hx    Rectal cancer Neg Hx     Review of Systems: All other systems reviewed and are otherwise negative except as noted above.   Physical Exam: There were no vitals filed for this visit.   GEN- The patient is well appearing, alert and oriented x 3 today.   HEENT: normocephalic, atraumatic; sclera clear, conjunctiva pink; hearing intact; oropharynx clear; neck supple, no JVP Lymph- no cervical lymphadenopathy Lungs- Clear to ausculation bilaterally, normal work of breathing.  No wheezes, rales, rhonchi Heart- {Blank single:19197::"Regular","Irregularly irregular"}  rate and rhythm, no murmurs, rubs or gallops, PMI not laterally displaced GI- soft, non-tender, non-distended, bowel sounds present, no hepatosplenomegaly Extremities- no clubbing or cyanosis. {EDEMA CLEXN:17001} peripheral edema; DP/PT/radial pulses 2+ bilaterally MS- no significant deformity or atrophy Skin- warm and dry, no rash or lesion; ICD pocket well healed Psych- euthymic mood, full affect Neuro- strength and sensation are intact  ICD interrogation- reviewed in detail today,  See PACEART report  EKG:  EKG {ACTION; IS/IS VCB:[id number]} ordered today. Personal review of EKG ordered {Blank single:19197::"today","***"} shows ***  Recent Labs: No results found for requested labs within last 365 days.   Wt Readings from Last 3 Encounters:  05/07/21 140 lb (63.5 kg)  07/21/20 141 lb (64 kg)  05/18/20 150 lb (68 kg)     Other studies Reviewed: Additional studies/ records that were reviewed today include: Previous EP office notes.   Assessment and  Plan:  1.  Chronic systolic dysfunction s/p [employer] S-ICD  2. NICM EF recovered by cMRI 2016 euvolemic today Stable on an appropriate medical regimen Normal ICD function See Pace Art report No changes today  3. HTN Stable on current regimen    Current medicines are reviewed at length with the patient today.   =  Labs/ tests ordered today include: *** No orders of the defined types were placed in this encounter.    Disposition:   Follow up with {EPMDS:28135} {Blank single:19197::"in 2 weeks","in 4 weeks","in 3 months","in 6 months","in 12 months","as usual post gen change"}    Signed, Shirley Friar, PA-C  05/08/2022 11:30 AM  [facility] [address] [place] [address] [phone] (office) [phone] (fax)

## 2022-05-09 ENCOUNTER — Ambulatory Visit: Payer: Self-pay | Attending: Student | Admitting: Student

## 2022-05-09 DIAGNOSIS — I428 Other cardiomyopathies: Secondary | ICD-10-CM

## 2022-05-09 DIAGNOSIS — I4891 Unspecified atrial fibrillation: Secondary | ICD-10-CM

## 2022-05-09 DIAGNOSIS — I469 Cardiac arrest, cause unspecified: Secondary | ICD-10-CM

## 2023-07-10 ENCOUNTER — Ambulatory Visit (HOSPITAL_COMMUNITY)
Admission: EM | Admit: 2023-07-10 | Discharge: 2023-07-10 | Disposition: A | Discharge status: Home / Self Care | Payer: Medicaid Other

## 2023-07-10 NOTE — Progress Notes (Signed)
   07/10/23 1928  [facility] Triage Screening (Walk-ins at [facility] only)  How Did You Hear About [place]? Legal System  What Is the Reason for Your Visit/Call Today? Pt brought to [facility] voluntarily by [employer]. Pt reports that his bondsman suggested that he come in for an suicidal evaluation. Pt reports that 4 days ago he was given an ankle monitor to make sure he returns to court. Pt reports that he took it off because it was so uncomfortable. Pt reports that because he took the ankle monitor off the bondsman told him to get an evaluation and call him daily and he wouldn't have to wear it anymore. Pt denies SI,HI, and AVH. Pt reports crack cocaine use with the last use at 4:00pm today. Pt reports that he somes approxiametly twice a week. Pt denies any other substance use. Pt denies outpatient serives. Pt reports that he feels safe to return home.  How Long Has This Been Causing You Problems? 1 wk - 1 month  Have You Recently Had Any Thoughts About Hurting Yourself? No  Are You Planning to Commit Suicide/Harm Yourself At This time? No  Have you Recently Had Thoughts About Hurting Someone Karolee Ohs? No  Are You Planning To Harm Someone At This Time? No  Are you currently experiencing any auditory, visual or other hallucinations? No  Have You Used Any Alcohol or Drugs in the Past 24 Hours? Yes  How long ago did you use Drugs or Alcohol? 4:00 pm today  What Did You Use and How Much? Crack Cocaine  Do you have any current medical co-morbidities that require immediate attention? No  Clinician description of patient physical appearance/behavior: Pt was anxious but cooperative.  What Do You Feel Would Help You the Most Today? Stress Management;Treatment for Depression or other mood problem  If access to [facility] Urgent Care was not available, would you have sought care in the Emergency Department? Yes  Determination of Need Routine (7 days)  Options For Referral Outpatient Therapy;Medication Management    Flowsheet Row ED from  07/10/2023 in [facility] Admission (Discharged) from 05/26/2019 in [facility] ED from 05/24/2019 in [facility] Emergency Department at [facility] Acute Care  C-SSRS RISK CATEGORY No Risk No Risk No Risk

## 2023-07-11 ENCOUNTER — Ambulatory Visit (HOSPITAL_COMMUNITY)
Admission: EM | Admit: 2023-07-11 | Discharge: 2023-07-11 | Disposition: A | Discharge status: Home / Self Care | Payer: Medicaid Other | Attending: Psychiatry | Admitting: Psychiatry

## 2023-07-11 DIAGNOSIS — Z79899 Other long term (current) drug therapy: Secondary | ICD-10-CM | POA: Insufficient documentation

## 2023-07-11 DIAGNOSIS — Z789 Other specified health status: Secondary | ICD-10-CM

## 2023-07-11 DIAGNOSIS — Z0289 Encounter for other administrative examinations: Secondary | ICD-10-CM | POA: Insufficient documentation

## 2023-07-11 DIAGNOSIS — I10 Essential (primary) hypertension: Secondary | ICD-10-CM | POA: Insufficient documentation

## 2023-07-11 NOTE — ED Notes (Signed)
Patient d/c in stable condition. AVS reviewed with patient expressing understanding.

## 2023-07-11 NOTE — ED Provider Notes (Signed)
Behavioral Health Urgent Care Medical Screening Exam  Patient Name: [name] [name] MRN: [id number] Date of Evaluation: 07/11/23 Chief Complaint:  [name] [name] stated " I need a letter stating that I am not suicidal for the bondsman in [place]."  Diagnosis:  Final diagnoses:  Need for community resource    History of Present illness: [name] [name] is a 62 y.o. male presents to [facility] urgent care requesting a letter for his bails bondsman in [place].  Patient reports he has a felony charge in [place] and was placed on a ankle monitoring system.  States he cut off the ankle monitoring system due to it being uncomfortable.  States he was advised by the bails bond man to get an evaluation stating that  " I am not suicidal."  Patient does admit to previous mental health history however, denied that he is followed by therapy or psychiatry currently.  He is denying suicidal or homicidal ideations.  Denies auditory visual hallucinations. He denied that he is prescribed any psychotropic medications.  Discussed following up with outpatient for throughput  Open access to to establish care.  Patient declined.  Patient admits to occasional substance abuse use.  States he resides alone with a few And a Radiation protection practitioner.  He reports a cardiac condition where he is only prescribed lisinopril for hypertension.  Patient has no other documented concerns at this visit.  Patient was seen and evaluated by this provider and psychiatrist Lucianne Muss.  Patient provided with additional outpatient resources to establish care.  Support, encouragement and reassurance was provided.  During evaluation [name] [name] is sitting in no acute distress.  Patient presented disheveled and malodorous. He is alert/oriented x 4; calm/cooperative; and mood congruent with affect. He is speaking in a clear tone at moderate volume, and normal pace; with good eye contact. His thought process is coherent and relevant; There is no indication that  he is currently responding to internal/external stimuli or experiencing delusional thought content; and he has denied suicidal/self-harm/homicidal ideation, psychosis, and paranoia.   Patient has remained calm throughout assessment and has answered questions appropriately.     [name] [name] is educated and verbalizes understanding of mental health resources and other crisis services in the community. He is instructed to call 911 and present to the nearest emergency room should he experience any suicidal/homicidal ideation, auditory/visual/hallucinations, or detrimental worsening of his mental health condition. He was a also advised by [name] research associate that he could call the toll-free phone on insurance card to assist with identifying in network counselors and agencies or number on back of Medicaid card t speak with care coordinator   Flowsheet Row ED from 07/11/2023 in [facility] ED from 07/10/2023 in [facility] Ad[facility] (Discharged[facility]  C-SSRS RISK CATEGORY No Risk No Risk No Risk       Psychiatric Specialty Exam  Presentation  General Appearance:Appropriate for Environment  Eye Contact:Good  Speech:Clear and Coherent  Speech Volume:Normal  Handedness:Right   Mood and Affect  Mood: Depressed  Affect: Congruent   Thought Process  Thought Processes: Coherent  Descriptions of Associations:Intact  Orientation:Full (Time, Place and Person)  Thought Content:Logical    Hallucinations:None  Ideas of Reference:None  Suicidal Thoughts:No  Homicidal Thoughts:No   Sensorium  Memory: Remote Good; Recent Good  Judgment: Good  Insight: Good   Executive Functions  Concentration: Fair  Attention Span: Fair  Recall: Fair  Fund of Knowledge: Fair  Language: Good   Psychomotor Activity  Psychomotor Activity: Normal   Assets   Assets: Desire for Improvement; Social Support   Sleep  Sleep: Fair  Number of hours: No data recorded  Physical Exam: Physical Exam Vitals and nursing note reviewed.  Constitutional:      Appearance: Normal appearance.  Cardiovascular:     Rate and Rhythm: Normal rate and regular rhythm.  Neurological:     Mental Status: He is alert and oriented to person, place, and time.  Psychiatric:        Mood and Affect: Mood normal.        Behavior: Behavior normal.    Review of Systems  Psychiatric/Behavioral:  Positive for substance abuse. Negative for depression and suicidal ideas. The patient is not nervous/anxious.   All other systems reviewed and are negative.  Blood pressure (!) 156/98, pulse 88, temperature 98.7 F (37.1 C), temperature source Oral, resp. rate 19, SpO2 100%. There is no height or weight on file to calculate BMI.  Musculoskeletal: Strength & Muscle Tone: within normal limits Gait & Station: normal Patient leans: N/A   [facility] MSE Discharge Disposition for Follow up and Recommendations: Based on my evaluation the patient does not appear to have an emergency medical condition and can be discharged with resources and follow up care in outpatient services for throughput for Open access.    Oneta Rack, NP 07/11/2023, 1:34 PM

## 2023-07-11 NOTE — Progress Notes (Signed)
   07/11/23 1107  [facility] Triage Screening (Walk-ins at [facility] only)  How Did You Hear About [place]? Legal System  What Is the Reason for Your Visit/Call Today? [name] [name] is a 62 year old male presenting to [facility] escorted by [facility] voluntarily. Pt reports that his bondsman suggested that he come in for an suicidal evaluation. Pt reports that 5 days ago he was given an ankle monitor to make sure he returns to court. Pt reports that he took it off because it was so uncomfortable. Pt reports that because he took the ankle monitor off the bondsman told him to get an evaluation and call him daily and he wouldn't have to wear it anymore. Pt reports crack cocaine use with the last use at 4:00pm yesterday. Pt reports that he uses approxiametly twice within the past two weeks. Pt denies any other substance use. Pt denies outpatient services at this time. Pt is also looking for medication to help him sleep at night. Pt denies SI,HI, and AVH.  How Long Has This Been Causing You Problems? <Week  Have You Recently Had Any Thoughts About Hurting Yourself? No  Are You Planning to Commit Suicide/Harm Yourself At This time? No  Have you Recently Had Thoughts About Hurting Someone Karolee Ohs? No  Are You Planning To Harm Someone At This Time? No  Are you currently experiencing any auditory, visual or other hallucinations? No  Have You Used Any Alcohol or Drugs in the Past 24 Hours? No  Do you have any current medical co-morbidities that require immediate attention? No  Clinician description of patient physical appearance/behavior: calm, cooperative  What Do You Feel Would Help You the Most Today? Medication(s)  If access to [facility] was not available, would you have sought care in the Emergency Department? No  Determination of Need Routine (7 days)  Options For Referral Medication Management

## 2023-07-11 NOTE — Discharge Instructions (Signed)

## 2023-07-16 ENCOUNTER — Ambulatory Visit: Payer: Self-pay | Admitting: Student

## 2023-07-19 ENCOUNTER — Encounter: Payer: Self-pay | Admitting: Internal Medicine

## 2023-08-08 ENCOUNTER — Encounter: Payer: Self-pay | Admitting: *Deleted

## 2023-08-08 ENCOUNTER — Telehealth: Payer: Self-pay | Admitting: Student

## 2023-08-08 ENCOUNTER — Other Ambulatory Visit: Payer: Self-pay | Admitting: *Deleted

## 2023-08-08 ENCOUNTER — Ambulatory Visit: Payer: Medicaid Other | Attending: Student | Admitting: Student

## 2023-08-08 VITALS — BP 144/88 | HR 65 | Ht 70.0 in | Wt 135.6 lb

## 2023-08-08 DIAGNOSIS — I428 Other cardiomyopathies: Secondary | ICD-10-CM

## 2023-08-08 DIAGNOSIS — I4891 Unspecified atrial fibrillation: Secondary | ICD-10-CM

## 2023-08-08 DIAGNOSIS — I10 Essential (primary) hypertension: Secondary | ICD-10-CM

## 2023-08-08 NOTE — Progress Notes (Signed)
  Electrophysiology Office Note:   ID:  RAYNAV ETHRIDGE, DOB 1961-03-08, MRN [id number]  Primary Cardiologist: None Electrophysiologist: Sherryl Manges, MD      History of Present Illness:   [name] [name] is a 62 y.o. male with h/o NICM, aborted cardiac arrest 2016, polysubstance abuse, Hep C, and HTN seen today for routine electrophysiology followup.   Pts device has been ERI since 10/2020 and did not attempt to connect today.    Since last being seen in our clinic the patient reports doing well overall. He just didn't wish to follow up. States he was partying and doing well. His mother passed, and he knew that it was her desire for him to get his battery updated, so he chose to make follow up.  Substance abuse is on-going, last used Cocaine 12/9. Currently, he denies symptoms of palpitations, chest pain, shortness of breath, orthopnea, PND, lower extremity edema, claudication, dizziness, presyncope, syncope, bleeding, or neurologic sequela.   Review of systems complete and found to be negative unless listed in HPI.   EP Information / Studies Reviewed:    EKG is ordered today. Personal review as below.  EKG Interpretation Date/Time:  Wednesday August 08 2023 11:52:59 EST Ventricular Rate:  95 PR Interval:  130 QRS Duration:  90 QT Interval:  374 QTC Calculation: 469 R Axis:   76  Text Interpretation: Normal sinus rhythm Normal ECG When compared with ECG of 25-May-2019 07:22, PREVIOUS ECG IS PRESENT Confirmed by Maxine Glenn [phone]) on 08/08/2023 1:36:59 PM    ICD Interrogation-  reviewed in detail today,  See PACEART report.  Device History: BSCi S-ICD implanted 01/29/2015   Monitor 06/2020 Recurrent tachycardia Onset and offset not available in the reports Will request   EF 35% on arrival with subsequent normalization.   Catheterization 5/16 demonstrated no obstructive coronary disease and normalization of LV function   Brugada-flecainide challenge was negative.    Signal average was negative.   MRI was mildly abnormal in that there was no structural heart disease but a small degree of gadolinium enhancement in the inferior wall   Physical Exam:   VS:  BP (!) 144/88   Pulse 65   Ht 5\' 10"  (1.778 m)   Wt 135 lb 9.6 oz (61.5 kg)   SpO2 96%   BMI 19.46 kg/m    Wt Readings from Last 3 Encounters:  08/08/23 135 lb 9.6 oz (61.5 kg)  05/07/21 140 lb (63.5 kg)  07/21/20 141 lb (64 kg)     GEN: Well nourished, well developed in no acute distress NECK: No JVD; No carotid bruits CARDIAC: Regular rate and rhythm, no murmurs, rubs, gallops RESPIRATORY:  Clear to auscultation without rales, wheezing or rhonchi  ABDOMEN: Soft, non-tender, non-distended EXTREMITIES:  No edema; No deformity   ASSESSMENT AND PLAN:    Aborted Cardiac Arrest  s/p [employer] S-ICD  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 Graciela Husbands who is agreeable to gen change.  Explained risks, benefits, and alternatives to S-ICD generator change, including but not limited to bleeding and infection, higher still in the setting of substance abuse.   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  Of note, he was NOT cocaine positive at time of arrest.   Disposition:   Follow up with Dr. Graciela Husbands as usual post procedure   Signed, Graciella Freer, PA-C

## 2023-08-08 NOTE — Telephone Encounter (Signed)
Pt's sister Babette Relic is requesting a callback regarding her forgetting to ask a question today at the pt's office visit. She stated she forgot to ask if it would be ok for pt to travel after getting his batteries replaces in his defibrillator 5 days later. Please advise

## 2023-08-08 NOTE — Patient Instructions (Addendum)
Medication Instructions:  Your physician recommends that you continue on your current medications as directed. Please refer to the Current Medication list given to you today.  *If you need a refill on your cardiac medications before your next appointment, please call your pharmacy*  Lab Work: BMET, CBC-within 30 days of procedure. You will need to go to any [facility] for these labs. There is a [facility] in our building on the 1st floor or you can call [phone] or visit [email or web address] to find a lab near you. - You do NOT need an appointment for this. You do NOT need to be fasting. We NO LONGER have a lab located in our office.  If you have labs (blood work) drawn today and your tests are completely normal, you will receive your results only by: MyChart Message (if you have MyChart) OR A paper copy in the mail If you have any lab test that is abnormal or we need to change your treatment, we will call you to review the results.  Testing/Procedures: See letter  Follow-Up: At [facility], you and your health needs are our priority.  As part of our continuing mission to provide you with exceptional heart care, we have created designated Provider Care Teams.  These Care Teams include your primary Cardiologist (physician) and Advanced Practice Providers (APPs -  Physician Assistants and Nurse Practitioners) who all work together to provide you with the care you need, when you need it.  Your next appointment:   Your follow up appointments will be scheduled and print out on your discharge summary after your procedure.

## 2023-08-09 NOTE — Telephone Encounter (Signed)
Made another attempt to reach pt, no answer, no voicemail.

## 2023-08-09 NOTE — Telephone Encounter (Signed)
Attempted phone call to pt as pt's sister is not listed on pt's DPR.  No answer and no voicemail to leave message.

## 2023-08-17 ENCOUNTER — Telehealth: Payer: Self-pay | Admitting: Internal Medicine

## 2023-08-17 NOTE — Telephone Encounter (Signed)
Can you weigh in on this Dr. Graciela Husbands?

## 2023-08-17 NOTE — Telephone Encounter (Signed)
Sister Babette Relic) stated patient is scheduled to have the battery replaced on his pacemaker on 1/8.  Sister stated they will be traveling to [place] on 1/13, which will be a 3.5 hour drive and wants to know if it will be safe for the patient to make this trip.

## 2023-08-17 NOTE — Telephone Encounter (Signed)
Made another attempt to reach pt but was unsuccessful. Unable to leave voicemail.

## 2023-08-20 NOTE — Telephone Encounter (Signed)
Returned call.  Advised ok to travel.  Pt will not be driving.

## 2023-09-03 ENCOUNTER — Telehealth: Payer: Self-pay | Admitting: Internal Medicine

## 2023-09-03 NOTE — Telephone Encounter (Signed)
 Attempted phone call to pt as pt's sister is not on DPR.  No answer and no voicemail.  Made phone call to pt's sister and advised she is not listed as pt's DPR.  Pt's sister states she will contact pt and ask him to call as well.

## 2023-09-03 NOTE — Telephone Encounter (Signed)
 This has been addressed, see 08/17/23 phone note.

## 2023-09-03 NOTE — Telephone Encounter (Signed)
 Pt's sister called stating that pt would like to cancel scheduled procedure on 1/8. Please advise

## 2023-09-04 NOTE — Pre-Procedure Instructions (Signed)
 Attempted to call patient regarding procedure instructions for tomorrow.  Patient states he didn't have labs drawn.  I told him we could do labs tomorrow.  He states he is not having procedure done.  I will inform Dr Graciela Husbands

## 2023-09-04 NOTE — Telephone Encounter (Signed)
Attempted phone call to pt.  No answer and no voicemail. 

## 2023-09-04 NOTE — Telephone Encounter (Signed)
 Per Janne Napoleon, RN she has spoken with the pt who wishes to cancel his generator change scheduled for 09/05/2023 with Dr Graciela Husbands.

## 2023-09-05 ENCOUNTER — Encounter (HOSPITAL_COMMUNITY): Admission: RE | Payer: Self-pay | Source: Home / Self Care

## 2023-09-05 ENCOUNTER — Ambulatory Visit (HOSPITAL_COMMUNITY): Admission: RE | Admit: 2023-09-05 | Payer: Medicaid Other | Source: Home / Self Care | Admitting: Internal Medicine

## 2023-09-05 DIAGNOSIS — Z8674 Personal history of sudden cardiac arrest: Secondary | ICD-10-CM

## 2023-09-05 DIAGNOSIS — Z95810 Presence of automatic (implantable) cardiac defibrillator: Secondary | ICD-10-CM

## 2023-09-05 SURGERY — SUBQ ICD CHANGEOUT
Anesthesia: General

## 2023-09-06 ENCOUNTER — Telehealth: Payer: Self-pay | Admitting: Internal Medicine

## 2023-09-06 DIAGNOSIS — Z95810 Presence of automatic (implantable) cardiac defibrillator: Secondary | ICD-10-CM

## 2023-09-06 DIAGNOSIS — Z8674 Personal history of sudden cardiac arrest: Secondary | ICD-10-CM

## 2023-09-06 DIAGNOSIS — I428 Other cardiomyopathies: Secondary | ICD-10-CM

## 2023-09-06 DIAGNOSIS — Z01812 Encounter for preprocedural laboratory examination: Secondary | ICD-10-CM

## 2023-09-06 NOTE — Telephone Encounter (Signed)
 Letter completed - See letter for complete details.

## 2023-09-06 NOTE — Telephone Encounter (Signed)
 Spoke with pt's sister, Madelin Silvan and advised pt's surgery will be scheduled for 09/26/2023 with Dr Fernande.  Pt will be contacted with instructions.  Advised letter will be ready to be picked up in the [place] office on 09/07/2023.  Pt's sister verbalizes understanding and agrees with current plan.

## 2023-09-06 NOTE — Telephone Encounter (Signed)
 Spoke with pt who states he would like to reschedule ICD generator change that was scheduled and canceled for 09/05/2023 with Dr Fernande.  Pt is also requesting a letter that he can take to a court date with him on 09/11/2023.  Pt advised once procedure has been reschedule we will contact pt will instructions.  Pt gives verbal permission to speak with his sister, Madelin Silvan to share information regarding generator change.  Pt verbalizes understanding and agrees with current plan.

## 2023-09-06 NOTE — Telephone Encounter (Signed)
Attempted phone call to pt.  No answer and no voicemail. 

## 2023-09-06 NOTE — Telephone Encounter (Signed)
 Call received from checkout - patient would like to reschedule the ICD Gen Change that was scheduled for yesterday but had to be canceled.  Patient states, he would also like a call back. Specifically from Dr. Graciela Husbands.

## 2023-09-07 NOTE — Telephone Encounter (Signed)
 Spoke with pt's sister, Madelin and advised I have tried to contact pt this morning as generator change had to be moved to 10/05/2023 with arrival of 530am.  Letters and lab orders have been placed with front desk for pick up.  She verbalizes understanding and thanked CHARITY FUNDRAISER for the call.

## 2023-09-17 NOTE — Telephone Encounter (Signed)
Perhaps we can set up a phone call Thanks SK

## 2023-09-18 NOTE — Telephone Encounter (Signed)
ICD generator change has been rescheduled for 10/05/2023.

## 2023-09-20 LAB — CBC
Hematocrit: 44.8 % (ref 37.5–51.0)
Hemoglobin: 14.3 g/dL (ref 13.0–17.7)
MCH: 26.5 pg — ABNORMAL LOW (ref 26.6–33.0)
MCHC: 31.9 g/dL (ref 31.5–35.7)
MCV: 83 fL (ref 79–97)
Platelets: 244 10*3/uL (ref 150–450)
RBC: 5.39 x10E6/uL (ref 4.14–5.80)
RDW: 13.6 % (ref 11.6–15.4)
WBC: 7.5 10*3/uL (ref 3.4–10.8)

## 2023-09-20 LAB — BASIC METABOLIC PANEL
BUN/Creatinine Ratio: 12 (ref 10–24)
BUN: 15 mg/dL (ref 8–27)
CO2: 22 mmol/L (ref 20–29)
Calcium: 9.3 mg/dL (ref 8.6–10.2)
Chloride: 101 mmol/L (ref 96–106)
Creatinine, Ser: 1.27 mg/dL (ref 0.76–1.27)
Glucose: 107 mg/dL — ABNORMAL HIGH (ref 70–99)
Potassium: 4.2 mmol/L (ref 3.5–5.2)
Sodium: 138 mmol/L (ref 134–144)
eGFR: 65 mL/min/{1.73_m2} (ref >59)

## 2023-10-04 ENCOUNTER — Encounter (HOSPITAL_COMMUNITY): Payer: Self-pay | Admitting: Registered Nurse

## 2023-10-04 NOTE — Pre-Procedure Instructions (Signed)
 Attempted to call patient regarding procedure instructions.  Call unable to be completed at this time.    Following instructions are for procedure:  Arrival time 0515 Nothing to eat or drink after midnight No meds AM of procedure Responsible person to drive you home and stay with you for 24 hrs Wash with special soap night before and morning of procedure

## 2023-10-05 ENCOUNTER — Ambulatory Visit (HOSPITAL_COMMUNITY): Admission: RE | Admit: 2023-10-05 | Payer: Medicaid Other | Source: Home / Self Care | Admitting: Internal Medicine

## 2023-10-05 ENCOUNTER — Encounter (HOSPITAL_COMMUNITY): Admission: RE | Payer: Self-pay | Source: Home / Self Care

## 2023-10-05 DIAGNOSIS — Z95810 Presence of automatic (implantable) cardiac defibrillator: Secondary | ICD-10-CM

## 2023-10-05 SURGERY — SUBQ ICD CHANGEOUT
Anesthesia: General

## 2023-10-18 ENCOUNTER — Ambulatory Visit: Payer: Medicaid Other

## 2023-10-30 ENCOUNTER — Telehealth: Payer: Self-pay

## 2023-10-30 NOTE — Telephone Encounter (Signed)
 Certified Letter mailed to pt per Dr Graciela Husbands due to 2 failed appointments for generator change..  Please see letter for complete details

## 2024-01-09 ENCOUNTER — Ambulatory Visit: Payer: Medicaid Other | Admitting: Pulmonary Disease

## 2024-07-24 ENCOUNTER — Emergency Department (HOSPITAL_COMMUNITY)
Admission: EM | Admit: 2024-07-24 | Discharge: 2024-07-24 | Disposition: A | Discharge status: Home / Self Care | Payer: MEDICAID | Attending: Emergency Medicine | Admitting: Emergency Medicine

## 2024-07-24 DIAGNOSIS — Y93G3 Activity, cooking and baking: Secondary | ICD-10-CM | POA: Insufficient documentation

## 2024-07-24 DIAGNOSIS — S59911A Unspecified injury of right forearm, initial encounter: Secondary | ICD-10-CM | POA: Diagnosis present

## 2024-07-24 DIAGNOSIS — T22011A Burn of unspecified degree of right forearm, initial encounter: Secondary | ICD-10-CM | POA: Diagnosis not present

## 2024-07-24 DIAGNOSIS — T310 Burns involving less than 10% of body surface: Secondary | ICD-10-CM | POA: Insufficient documentation

## 2024-07-24 DIAGNOSIS — Z23 Encounter for immunization: Secondary | ICD-10-CM | POA: Insufficient documentation

## 2024-07-24 DIAGNOSIS — X102XXA Contact with fats and cooking oils, initial encounter: Secondary | ICD-10-CM | POA: Insufficient documentation

## 2024-07-24 MED ORDER — BACITRACIN ZINC 500 UNIT/GM EX OINT
TOPICAL_OINTMENT | Freq: Two times a day (BID) | CUTANEOUS | Status: DC
  Filled 2024-07-24: qty 2.7

## 2024-07-24 MED ORDER — HYDROCODONE-ACETAMINOPHEN 5-325 MG PO TABS
1.0000 | ORAL_TABLET | Freq: Once | ORAL | Status: AC
  Administered 2024-07-24: 1 via ORAL
  Filled 2024-07-24: qty 1

## 2024-07-24 MED ORDER — IBUPROFEN 400 MG PO TABS
400.0000 mg | ORAL_TABLET | Freq: Once | ORAL | Status: DC
  Filled 2024-07-24: qty 1

## 2024-07-24 MED ORDER — TETANUS-DIPHTH-ACELL PERTUSSIS 5-2-15.5 LF-MCG/0.5 IM SUSP
0.5000 mL | Freq: Once | INTRAMUSCULAR | Status: AC
  Administered 2024-07-24: 0.5 mL via INTRAMUSCULAR
  Filled 2024-07-24: qty 0.5
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
## 2024-08-07 ENCOUNTER — Other Ambulatory Visit: Payer: Self-pay | Admitting: Family

## 2024-08-07 DIAGNOSIS — F17200 Nicotine dependence, unspecified, uncomplicated: Secondary | ICD-10-CM

## 2024-08-15 ENCOUNTER — Inpatient Hospital Stay: Admission: RE | Admit: 2024-08-15

## 2024-09-03 ENCOUNTER — Ambulatory Visit
Admission: RE | Admit: 2024-09-03 | Discharge: 2024-09-03 | Disposition: A | Discharge status: Home / Self Care | Payer: MEDICAID | Source: Ambulatory Visit | Attending: Family | Admitting: Family

## 2024-09-03 DIAGNOSIS — F17200 Nicotine dependence, unspecified, uncomplicated: Secondary | ICD-10-CM
